# Patient Record
Sex: Male | Born: 1945 | Race: White | Hispanic: No | Marital: Married | Smoking: Current every day smoker
Health system: Southern US, Community
[De-identification: ages and names within clinical notes are randomized; demographics above are authoritative.]

## PROBLEM LIST (undated history)

## (undated) DIAGNOSIS — I10 Essential (primary) hypertension: Secondary | ICD-10-CM

## (undated) DIAGNOSIS — M1990 Unspecified osteoarthritis, unspecified site: Secondary | ICD-10-CM

## (undated) DIAGNOSIS — E7800 Pure hypercholesterolemia, unspecified: Secondary | ICD-10-CM

## (undated) DIAGNOSIS — N289 Disorder of kidney and ureter, unspecified: Secondary | ICD-10-CM

## (undated) DIAGNOSIS — Z720 Tobacco use: Secondary | ICD-10-CM

## (undated) DIAGNOSIS — I700 Atherosclerosis of aorta: Secondary | ICD-10-CM

## (undated) DIAGNOSIS — J309 Allergic rhinitis, unspecified: Secondary | ICD-10-CM

## (undated) DIAGNOSIS — I480 Paroxysmal atrial fibrillation: Secondary | ICD-10-CM

## (undated) DIAGNOSIS — I6529 Occlusion and stenosis of unspecified carotid artery: Secondary | ICD-10-CM

## (undated) DIAGNOSIS — I2510 Atherosclerotic heart disease of native coronary artery without angina pectoris: Secondary | ICD-10-CM

## (undated) DIAGNOSIS — I4510 Unspecified right bundle-branch block: Secondary | ICD-10-CM

## (undated) HISTORY — DX: Pure hypercholesterolemia, unspecified: E78.00

## (undated) HISTORY — DX: Tobacco use: Z72.0

## (undated) HISTORY — DX: Paroxysmal atrial fibrillation: I48.0

## (undated) HISTORY — DX: Unspecified right bundle-branch block: I45.10

## (undated) HISTORY — DX: Atherosclerotic heart disease of native coronary artery without angina pectoris: I25.10

## (undated) HISTORY — DX: Occlusion and stenosis of unspecified carotid artery: I65.29

## (undated) HISTORY — DX: Atherosclerosis of aorta: I70.0

## (undated) HISTORY — DX: Essential (primary) hypertension: I10

## (undated) HISTORY — DX: Unspecified osteoarthritis, unspecified site: M19.90

## (undated) HISTORY — DX: Allergic rhinitis, unspecified: J30.9

---

## 2000-01-12 ENCOUNTER — Emergency Department (HOSPITAL_COMMUNITY): Admission: EM | Admit: 2000-01-12 | Discharge: 2000-01-12 | Payer: Self-pay | Admitting: Emergency Medicine

## 2004-07-22 ENCOUNTER — Emergency Department (HOSPITAL_COMMUNITY): Admission: EM | Admit: 2004-07-22 | Discharge: 2004-07-22 | Payer: Self-pay | Admitting: Emergency Medicine

## 2008-03-14 ENCOUNTER — Ambulatory Visit (HOSPITAL_COMMUNITY): Admission: RE | Admit: 2008-03-14 | Discharge: 2008-03-14 | Payer: Self-pay | Admitting: Interventional Cardiology

## 2008-03-25 HISTORY — PX: SHOULDER ACROMIOPLASTY: SHX6093

## 2008-03-25 HISTORY — PX: CAROTID STENT: SHX1301

## 2008-03-31 ENCOUNTER — Encounter: Admission: RE | Admit: 2008-03-31 | Discharge: 2008-03-31 | Payer: Self-pay | Admitting: Cardiology

## 2008-04-11 ENCOUNTER — Inpatient Hospital Stay (HOSPITAL_BASED_OUTPATIENT_CLINIC_OR_DEPARTMENT_OTHER): Admission: RE | Admit: 2008-04-11 | Discharge: 2008-04-11 | Payer: Self-pay | Admitting: Cardiology

## 2008-04-13 ENCOUNTER — Ambulatory Visit (HOSPITAL_COMMUNITY): Admission: RE | Admit: 2008-04-13 | Discharge: 2008-04-14 | Payer: Self-pay | Admitting: Interventional Cardiology

## 2008-04-13 DIAGNOSIS — I2510 Atherosclerotic heart disease of native coronary artery without angina pectoris: Secondary | ICD-10-CM

## 2008-04-13 HISTORY — DX: Atherosclerotic heart disease of native coronary artery without angina pectoris: I25.10

## 2008-05-16 ENCOUNTER — Encounter: Admission: RE | Admit: 2008-05-16 | Discharge: 2008-05-16 | Payer: Self-pay | Admitting: Orthopaedic Surgery

## 2008-06-14 ENCOUNTER — Ambulatory Visit (HOSPITAL_BASED_OUTPATIENT_CLINIC_OR_DEPARTMENT_OTHER): Admission: RE | Admit: 2008-06-14 | Discharge: 2008-06-14 | Payer: Self-pay | Admitting: Orthopaedic Surgery

## 2008-06-27 ENCOUNTER — Encounter: Admission: RE | Admit: 2008-06-27 | Discharge: 2008-09-25 | Payer: Self-pay | Admitting: Orthopaedic Surgery

## 2008-08-11 ENCOUNTER — Ambulatory Visit (HOSPITAL_BASED_OUTPATIENT_CLINIC_OR_DEPARTMENT_OTHER): Admission: RE | Admit: 2008-08-11 | Discharge: 2008-08-11 | Payer: Self-pay | Admitting: Orthopaedic Surgery

## 2008-09-27 ENCOUNTER — Encounter: Admission: RE | Admit: 2008-09-27 | Discharge: 2008-11-16 | Payer: Self-pay | Admitting: Orthopaedic Surgery

## 2009-06-24 ENCOUNTER — Emergency Department (HOSPITAL_COMMUNITY): Admission: EM | Admit: 2009-06-24 | Discharge: 2009-06-25 | Payer: Self-pay | Admitting: Emergency Medicine

## 2010-07-03 LAB — POCT I-STAT, CHEM 8
HCT: 47 % (ref 39.0–52.0)
Hemoglobin: 16 g/dL (ref 13.0–17.0)

## 2010-07-05 LAB — POCT I-STAT, CHEM 8
BUN: 13 mg/dL (ref 6–23)
Chloride: 109 mEq/L (ref 96–112)
Creatinine, Ser: 1 mg/dL (ref 0.4–1.5)
Potassium: 4 mEq/L (ref 3.5–5.1)
TCO2: 18 mmol/L (ref 0–100)

## 2010-07-09 LAB — CBC: WBC: 8.6 10*3/uL (ref 4.0–10.5)

## 2010-07-09 LAB — POCT I-STAT, CHEM 8
Calcium, Ion: 1.17 mmol/L (ref 1.12–1.32)
Creatinine, Ser: 1.2 mg/dL (ref 0.4–1.5)
Glucose, Bld: 87 mg/dL (ref 70–99)
HCT: 45 % (ref 39.0–52.0)
Hemoglobin: 15.3 g/dL (ref 13.0–17.0)
Potassium: 4.6 mEq/L (ref 3.5–5.1)
TCO2: 26 mmol/L (ref 0–100)

## 2010-07-09 LAB — BASIC METABOLIC PANEL
Calcium: 8.9 mg/dL (ref 8.4–10.5)
Chloride: 108 mEq/L (ref 96–112)
Creatinine, Ser: 1.01 mg/dL (ref 0.4–1.5)
GFR calc Af Amer: >60 mL/min (ref >60)
Sodium: 140 mEq/L (ref 135–145)

## 2010-08-07 NOTE — Op Note (Signed)
NAME:  Julian Weaver, Julian Weaver NO.:  [id number]   MEDICAL RECORD NO.:  [id number]          PATIENT TYPE:  AMB   LOCATION:  DSC                          FACILITY:  [facility]   PHYSICIAN:  Claude Manges. Whitfield, M.D.DATE OF BIRTH:  07-02-1945   DATE OF PROCEDURE:  06/14/2008  DATE OF DISCHARGE:                               OPERATIVE REPORT   PREOPERATIVE DIAGNOSES:  1. Rotator cuff tear, right shoulder with retraction.  2. Impingement.  3. Degenerative joint disease of acromioclavicular joint.  4. Tear of ruptured biceps tendon.   POSTOPERATIVE DIAGNOSES:  1. Rotator cuff tear, right shoulder with retraction.  2. Impingement.  3. Degenerative joint disease of acromioclavicular joint.  4. Tear of ruptured biceps tendon.   PROCEDURES:  1. Diagnostic arthroscopy, right shoulder with debridement of      synovitis and labral tear.  2. Arthroscopic subacromial decompression.  3. Arthroscopic distal clavicle resection.  4. Mini-open rotator cuff tear repair with supplemental Restore SIS      patch.   SURGEON:  Claude Manges. Cleophas Dunker, MD   ASSISTANT:  Oris Drone. Petrarca, PA-C   ANESTHESIA:  General.   COMPLICATIONS:  None.   HISTORY:  A 65 year old gentleman sustained an injury to his right  shoulder in October when he was pulling on an object.  He is having  persistent pain since that time.  He has had a recent cardiac stent and  could not have an MRI scan, but we did perform a CT arthrogram with  evidence of rotator cuff tear.  He is not to have an arthroscopic  evaluation remaining on his Lovenox because of the stent per his  cardiologist.   DESCRIPTION OF PROCEDURE:  With the patient comfortable on operating  table, he was placed under general orotracheal anesthesia without  difficulty.  He was then placed in semi-sitting position with a shoulder  frame.  The right upper extremity was prepped with DuraPrep from the  base of the neck circumferentially below the elbow, a  sterile draping  was performed.   Marking pen was used to outline the [facility] joint, the coracoid, and the  acromion.  At a point, a fingerbreadth posterior and medial to the  posterior angle of acromion, a small stab wound was made.  The  arthroscope easily placed into the shoulder joint.   Diagnostic arthroscopy revealed diffuse synovitis.  There was an obvious  tear of the rotator cuff with retraction.  There was obvious tearing of  the biceps tendon and a tear of the anterior labrum at about 3 o'clock  position.  A second portal was established anteriorly, shaving of the  joint was performed including a synovectomy and debridement of the  labrum.   The arthroscope was then placed at subacromial space posteriorly, the  cannula at subacromial space anteriorly, and a third portal was  established on the lateral subacromial space.  An arthroscopic  subacromial decompression was performed, there was considerable beefy  red bursal material, this was debrided with the ArthroCare wand.  There  was obvious overhang of the anterior and lateral acromion.  An  anteroinferior acromioplasty was  performed with a 6-mm bur with a very  nice decompression.  I encountered very little bleeding, which I did, I  was able to control with the ArthroCare wand.  Distal clavicle was  obviously degenerative with synovitis and the distal clavicle resection  was performed with 6-mm bur.  There was obvious complex tearing of the  cuff, as I could see it through the bursal surface with exposed humeral  head.   A mini-open rotator cuff tear repair was then performed, about an inch  and a half incision was made along the anterior aspect of the shoulder  while sharp dissection carried down to the subcutaneous tissue.  Small  bleeders were Bovie coagulated.  The deltoid fascia was incised along  one of its raphes.  The subacromial space was entered.  Self-retaining  retractors were inserted.  I thought he had a very  nice subacromial  decompression.  The cuff tear was identified.  It was a sort of V shaped  with very ragged edges.  I debrided the edges, I was able to loosen up  the edges by finger manipulation and then I sutured the edges with 0  Ethibond suture from superiorly to anteriorly inferiorly.  I  supplemented the repair with the Restore SIS patch after soaking up to  10 minutes in saline solution using 2-0 Ethibond.  I had a very nice  coverage, nice repair without evidence of impingement.  Wound was  irrigated with saline solution throughout the operative procedure.  There was some difficulty because of some bleeding, based on his Plavix  and aspirin.  The deltoid fascia was closed anatomically with a running  0 Vicryl, subcu with 2-0 Vicryl.  Skin closed with Steri-Strips and with  benzoin.  A sterile bulky dressing was applied followed by a sling.  We  did inject Marcaine with epinephrine.   The patient tolerated the procedure without complications.   PLAN:  Dilaudid for pain.  Phenergan for nausea.  Office, end of week.      Claude Manges. Cleophas Dunker, M.D.  Electronically Signed     PWW/MEDQ  D:  06/14/2008  T:  06/15/2008  Job:  [id number]

## 2010-08-07 NOTE — Op Note (Signed)
NAME:  Julian Weaver, Julian Weaver NO.:  [id number]   MEDICAL RECORD NO.:  [id number]          PATIENT TYPE:  AMB   LOCATION:  DSC                          FACILITY:  [facility]   PHYSICIAN:  Claude Manges. Whitfield, M.D.DATE OF BIRTH:  01-14-1946   DATE OF PROCEDURE:  08/11/2008  DATE OF DISCHARGE:                               OPERATIVE REPORT   PREOPERATIVE DIAGNOSIS:  Adhesive capsulitis, right shoulder.   POSTOPERATIVE DIAGNOSIS:  Adhesive capsulitis, right shoulder.   PROCEDURE:  Manipulation of right shoulder.   SURGEON:  Claude Manges. Cleophas Dunker, MD   ANESTHESIA:  General mask.   COMPLICATIONS:  None.   HISTORY:  A 65 year old gentleman underwent a right shoulder rotator  cuff tear repair 2 months ago.  He required a SIS patch given the  extensive nature of the tear with poor tissue.  He has progressed in  physical therapy to the point where he has having minimal discomfort  until he reaches a point where he is about 130 degrees of flexion.  At  that point, he is uncomfortably as a same pain with abduction that has  been limited as a result of adhesive capsulitis.  He is now to have  manipulation.   PROCEDURE:  With the patient comfortable on the operating stretcher, he  was placed under general mask anesthesia.  Gentle range of motion of the  shoulder, released adhesions such that the patient could have raise his  arm over his head, such that I could raise his arm over his head.  With  further lysis, I implied full abduction and external rotation.  The  shoulder was then prepped with the Betadine.  I injected 5 mL of 1%  Xylocaine with epinephrine and 80 mg Depo-Medrol on the shoulder joint.  Band-Aid was applied.  The patient was awoken and returned to the post  anesthesia recovery room.   PLAN:  Followup physical therapy.  Ultram for pain as he has difficulty  with codeine and oxycodone of his 10-14 days.      Claude Manges. Cleophas Dunker, M.D.  Electronically Signed     PWW/MEDQ  D:  08/11/2008  T:  08/11/2008  Job:  [id number]

## 2010-08-07 NOTE — Cardiovascular Report (Signed)
NAME:  Julian Weaver, Julian Weaver NO.:  [id number]   MEDICAL RECORD NO.:  [id number]          PATIENT TYPE:  INP   LOCATION:  2501                         FACILITY:  [facility]   PHYSICIAN:  Lyn Records, M.D.   DATE OF BIRTH:  01-Apr-1945   DATE OF PROCEDURE:  04/13/2008  DATE OF DISCHARGE:                            CARDIAC CATHETERIZATION   INDICATIONS:  Atypical chest pain and abnormal stress Cardiolite with  inferior ischemia.   PROCEDURE PERFORMED:  Drug-eluting stent RCA.   DESCRIPTION:  After informed consent, a 6-French sheath was placed in  the right femoral artery using a modified Seldinger technique.  A 6-  French side-hole Judkins right catheter was used for angiographic  imaging and the interventional procedure.  The patient received 600 mg  of Plavix prior to the procedure.  A BMW wire was advanced into the  vessel, it was used to get some general idea of the length of the  vessel.  It appeared to be about one-half the length of the radiopaque  portion of the wire.  We chose to direct stenting with a 3.0 x 18-mm  long Zion's V drug-eluting stent.  The stent was deployed to 13  atmospheres.  Two balloon inflations were performed.  We then  postdilated with a 12-mm long x 3.5-mm diameter Voyager [place] balloon.  Three balloon inflations were performed with the peak pressure of 16  atmospheres.  The final angiographic result despite high-pressure  balloon inflations demonstrated up to 10% narrowing in the proximal  portion of the stented region.  TIMI grade 3 flow was noted.  No  contrast staining or evidence of dissection was noted.  We did not  upgrade the size of our balloon because of the precarious location of  the proximal stent margin adjacent to normal tissue and the risk of  dissection.  If the patient does develop restenosis, we would consider  an additional stent implantation in this region with higher balloon  pressures and/or larger balloon sizes.  We did  manage a nice lumen with  brisk distal flow.   Angio-Seal was performed with good hemostasis.   CONCLUSION:  1. Successful drug-eluting stent implantation in the mid right      coronary with reduction in stenosis from 70% to less than 10% with      TIMI 3 flow.  2. Successful Angio-Seal.   PLAN:  Aspirin and Plavix for year.   DATE OF DISCHARGE:  April 14, 2008.      Lyn Records, M.D.  Electronically Signed    HWS/MEDQ  D:  04/13/2008  T:  04/14/2008  Job:  [id number]   cc:   Armanda Magic, M.D.  Duncan Dull, M.D.

## 2010-08-07 NOTE — Cardiovascular Report (Signed)
NAME:  Julian Weaver, Julian Weaver NO.:  [id number]   MEDICAL RECORD NO.:  [id number]          PATIENT TYPE:  OIB   LOCATION:  1965                         FACILITY:  [facility]   PHYSICIAN:  Armanda Magic, M.D.     DATE OF BIRTH:  1945-11-20   DATE OF PROCEDURE:  DATE OF DISCHARGE:  04/11/2008                            CARDIAC CATHETERIZATION   PROCEDURE:  Left heart catheterization, coronary angiography, and left  ventriculography.   OPERATOR:  Armanda Magic, MD   INDICATIONS:  Chest pain and abnormal coronary CTA with evidence of  obstructive disease in the right coronary artery.   COMPLICATIONS:  None.   IV ACCESS:  Via right femoral artery 4-French sheath.   MEDICATIONS USED:  Versed 1 mg IV and fentanyl 25 mcg IV.   This is a 65 year old male who presents with episodic chest pain and  underwent nuclear stress test which showed a mild perfusion defect in  the inferior wall.  He did not want to undergo cardiac catheterization  and underwent coronary CT angiogram which revealed obstructive disease  in the right coronary artery.  He now presents for cardiac  catheterization.   The patient was brought to the cardiac catheterization laboratory in a  fasting, nonsedated state.  Informed consent was obtained.  The patient  was connected to continuous heart rate and pulse oximetry monitoring and  intermittent blood pressure monitoring.  The right groin was prepped and  draped in a sterile fashion.  Xylocaine 1% was used for local  anesthesia.  Using a modified Seldinger technique, a 4-French sheath was  placed in the right femoral artery.  Under fluoroscopic guidance, a 4-  [place], JL-4 catheter was placed in the left coronary artery.  It could  not adequately engage the left coronary ostium and therefore was  exchanged out over a guidewire for a 4-French, JL-5 catheter.  The  catheter successfully engaged the coronary ostium.  Multiple cine films  were taken at 30-degree RAO  and 40-degree LAO views.  This catheter was  then exchanged out over a guidewire for a 4-French 3D RCA catheter which  successfully engaged the coronary ostium.  Multiple cine films taken at  30-degree RAO and 40-degree LAO views.  This catheter was then exchanged  out over a guidewire for a 4-French angled pigtail catheter which was  placed under fluoroscopic guidance in the left ventricular cavity.  Left  ventriculography was performed in a 30-degree RAO view using a total of  30 mL of contrast at 15 mL per second.  The catheter was then pulled  back across the aortic valve with no significant gradient noted.  At the  end of the procedure, all catheters and sheaths were removed.  Manual  compression was performed so adequate hemostasis was obtained.  The  patient transferred back to room in stable condition.   RESULTS:  The left main coronary artery is widely patent and bifurcates  into left anterior descending artery and left circumflex artery.  In the  LAO views, it looks like there is some minimal narrowing of the left  main distally up to 20-30% but  in other views, there does not appear to  be any significant atherosclerosis of the left main.  The left main then  bifurcates into left anterior descending artery and left circumflex  artery.  The left anterior descending artery has luminal irregularities  of 20-30% and traverses the apex.  It gives rise to 2 diagonal branches,  both of which are widely patent.   The left circumflex is widely patent throughout its course in the AV  groove with luminal irregularities throughout.  It gives rise to a small  first obtuse marginal branch and then a large second obtuse marginal  branch which bifurcates into 2 daughter branches, both of which are  widely patent.   The right coronary artery is widely patent in its proximal portion and  then there is a 60-70% tubular stenosis of the mid RCA.  The distal RCA  bifurcates into posterior  descending artery and posterolateral artery,  both of which are widely patent.   ASSESSMENT:  1. 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  2. Normal left ventricular function.  3. No evidence of gradient across the aortic valve and normal left      ventricular end-diastolic pressure.  4. Chest pain secondary to one-vessel obstructive disease in the right      coronary artery.   PLAN:  1. Add Imdur 30 mg a day.  2. Continue aspirin.  3. We will discharge home after IV fluid and bedrest.  4. We will set up for outpatient angioplasty and stent by Dr. Verdis Prime.      Armanda Magic, M.D.  Electronically Signed     TT/MEDQ  D:  04/12/2008  T:  04/13/2008  Job:  [id number]

## 2012-12-22 DIAGNOSIS — Z23 Encounter for immunization: Secondary | ICD-10-CM | POA: Diagnosis not present

## 2013-01-24 ENCOUNTER — Other Ambulatory Visit: Payer: Self-pay | Admitting: *Deleted

## 2013-01-24 DIAGNOSIS — E7800 Pure hypercholesterolemia, unspecified: Secondary | ICD-10-CM

## 2013-01-24 DIAGNOSIS — Z79899 Other long term (current) drug therapy: Secondary | ICD-10-CM

## 2013-02-25 ENCOUNTER — Ambulatory Visit: Payer: Self-pay | Admitting: Cardiology

## 2013-02-26 ENCOUNTER — Other Ambulatory Visit: Payer: Self-pay

## 2013-03-11 ENCOUNTER — Encounter: Payer: Self-pay | Admitting: General Surgery

## 2013-03-11 DIAGNOSIS — E7800 Pure hypercholesterolemia, unspecified: Secondary | ICD-10-CM

## 2013-03-11 DIAGNOSIS — R0989 Other specified symptoms and signs involving the circulatory and respiratory systems: Secondary | ICD-10-CM

## 2013-03-11 DIAGNOSIS — I10 Essential (primary) hypertension: Secondary | ICD-10-CM | POA: Insufficient documentation

## 2013-03-11 DIAGNOSIS — F17200 Nicotine dependence, unspecified, uncomplicated: Secondary | ICD-10-CM

## 2013-03-11 DIAGNOSIS — I2510 Atherosclerotic heart disease of native coronary artery without angina pectoris: Secondary | ICD-10-CM | POA: Insufficient documentation

## 2013-03-11 DIAGNOSIS — Z79899 Other long term (current) drug therapy: Secondary | ICD-10-CM

## 2013-03-12 ENCOUNTER — Ambulatory Visit (INDEPENDENT_AMBULATORY_CARE_PROVIDER_SITE_OTHER): Payer: Medicare Other | Admitting: Cardiology

## 2013-03-12 ENCOUNTER — Encounter: Payer: Self-pay | Admitting: Cardiology

## 2013-03-12 VITALS — BP 134/78 | HR 61 | Ht 69.0 in | Wt 176.0 lb

## 2013-03-12 DIAGNOSIS — I10 Essential (primary) hypertension: Secondary | ICD-10-CM | POA: Diagnosis not present

## 2013-03-12 DIAGNOSIS — F17200 Nicotine dependence, unspecified, uncomplicated: Secondary | ICD-10-CM

## 2013-03-12 DIAGNOSIS — E7800 Pure hypercholesterolemia, unspecified: Secondary | ICD-10-CM | POA: Diagnosis not present

## 2013-03-12 DIAGNOSIS — I2510 Atherosclerotic heart disease of native coronary artery without angina pectoris: Secondary | ICD-10-CM | POA: Diagnosis not present

## 2013-03-12 DIAGNOSIS — Z720 Tobacco use: Secondary | ICD-10-CM | POA: Insufficient documentation

## 2013-03-12 LAB — LIPID PANEL
HDL: 40.6 mg/dL (ref >39.00)
LDL Cholesterol: 57 mg/dL (ref 0–99)
Total CHOL/HDL Ratio: 3
VLDL: 14.4 mg/dL (ref 0.0–40.0)

## 2013-03-12 LAB — ALT: ALT: 21 U/L (ref 0–53)

## 2013-03-12 MED ORDER — SIMVASTATIN 20 MG PO TABS: 20.0000 mg | ORAL_TABLET | Freq: Every day | ORAL | Status: DC

## 2013-03-12 MED ORDER — AMLODIPINE BESYLATE 5 MG PO TABS: 5.0000 mg | ORAL_TABLET | Freq: Every day | ORAL | Status: DC

## 2013-03-12 NOTE — Progress Notes (Signed)
[address] [place], [place]  [address] Phone: [phone] Fax:  [phone]  Date:  03/12/2013   ID:  Julian Weaver, DOB 08-15-1945, MRN [id number]  PCP:  Hollice Espy, MD  Cardiologist:  Armanda Magic, MD     History of Present Illness: Julian Weaver is a 67 y.o. 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  He is doing well.  He denies any chest pain, SOB, DOE, LE edema, palpitations or syncope.  He occasionally will have a dizzy spell.  He has had a chronic nonproductive cough   Wt Readings from Last 3 Encounters:  03/12/13 176 lb (79.833 kg)  03/11/13 172 lb 6.4 oz (78.2 kg)     Past Medical History  Diagnosis Date  . Hypertension   . Hypercholesteremia   . Tobacco abuse   . RBBB   . Coronary artery disease 04/13/2008    Drug eluting stent in RCA    Current Outpatient Prescriptions  Medication Sig Dispense Refill  . amLODipine (NORVASC) 5 MG tablet Take 5 mg by mouth daily.      [name] aspirin 325 MG EC tablet Take 325 mg by mouth daily.      [name] buPROPion (WELLBUTRIN XL) 150 MG 24 hr tablet Take 150 mg by mouth daily.      . clopidogrel (PLAVIX) 75 MG tablet Take 75 mg by mouth daily with breakfast.      . co-enzyme Q-10 30 MG capsule Take 100 mg by mouth daily.      . diphenhydrAMINE (BENADRYL) 25 MG tablet Take 25 mg by mouth every 6 (six) hours as needed (for allergic reaction).      [name] EPINEPHrine (EPIPEN IJ) Inject as directed as needed.      . promethazine-codeine (PHENERGAN WITH CODEINE) 6.25-10 MG/5ML syrup Take 5 mLs by mouth every 6 (six) hours as needed for cough.      . simvastatin (ZOCOR) 20 MG tablet Take 20 mg by mouth daily.       No current facility-administered medications for this visit.    Allergies:    Allergies  Allergen Reactions  . Bee Venom Swelling    Social History:  The patient  reports that he has been smoking.  He does not have any smokeless tobacco history on file. He  reports that he does not drink alcohol or use illicit drugs.   Family History:  The patient's family history is not on file.   ROS:  Please see the history of present illness.      All other systems reviewed and negative.   PHYSICAL EXAM: VS:  BP 134/78  Pulse 61  Ht 5\' 9"  (1.753 m)  Wt 176 lb (79.833 kg)  BMI 25.98 kg/m2 Well nourished, well developed, in no acute distress HEENT: normal Neck: no JVD Cardiac:  normal S1, S2; RRR; no murmur Lungs:  clear to auscultation bilaterally, no wheezing, rhonchi or rales Abd: soft, nontender, no hepatomegaly Ext: no edema Skin: warm and dry Neuro:  CNs 2-12 intact, no focal abnormalities noted  EKG:  NSR with no ST changes     ASSESSMENT AND PLAN:  1. HTN - well controlled  - continue amlodipine 2.   Dyslipidemia -recheck fasting lipids and ALT   - continue simvastatin 3.   ASCAD   - continue ASA  - he is 4 years out from PCI so I have told him to stop Plavix  4.  Cough - I have asked him to make an appt to see his PCP in regards to his cough - he is not on  any meds that should cause cough but is a smoker 5.  Tobacco use - he is not ready to quit  Followup with me in 1 year   Signed, Armanda Magic, MD 03/12/2013 9:41 AM

## 2013-03-12 NOTE — Patient Instructions (Signed)
You will need lab work today:  Lipids & ALT We will call you with your results  Follow-up with your primary care doctor about your cough  Medication changes:   STOP  Plavix  Your physician wants you to follow-up in: 1 year with Dr. Mayford Knife.  You will receive a reminder letter in the mail two months in advance. If you don't receive a letter, please call our office to schedule the follow-up appointment.

## 2013-03-16 ENCOUNTER — Other Ambulatory Visit: Payer: Self-pay | Admitting: General Surgery

## 2013-03-16 ENCOUNTER — Encounter: Payer: Self-pay | Admitting: General Surgery

## 2013-03-16 DIAGNOSIS — E7800 Pure hypercholesterolemia, unspecified: Secondary | ICD-10-CM

## 2013-03-21 ENCOUNTER — Other Ambulatory Visit: Payer: Self-pay | Admitting: Cardiology

## 2013-05-05 DIAGNOSIS — J40 Bronchitis, not specified as acute or chronic: Secondary | ICD-10-CM | POA: Diagnosis not present

## 2013-06-30 ENCOUNTER — Telehealth: Payer: Self-pay

## 2013-06-30 NOTE — Telephone Encounter (Signed)
Made Mary aware.

## 2013-06-30 NOTE — Telephone Encounter (Signed)
I tried calling back and they were closed for lunch. Will call back after two.

## 2013-06-30 NOTE — Telephone Encounter (Signed)
Received a call from Dr.Cate's office.She stated she needed to know if patient requires antibiotic before dental work.Call back PoydrasMary or Morrie Sheldonshley at # [phone].Message sent to Dr.Turner for advice.

## 2013-06-30 NOTE — Telephone Encounter (Signed)
No SBE prophylaxis needed. 

## 2013-06-30 NOTE — Telephone Encounter (Signed)
To Dr Turner to advise 

## 2013-07-21 DIAGNOSIS — T148XXA Other injury of unspecified body region, initial encounter: Secondary | ICD-10-CM | POA: Diagnosis not present

## 2013-09-14 ENCOUNTER — Other Ambulatory Visit: Payer: Medicare Other

## 2013-09-15 ENCOUNTER — Other Ambulatory Visit: Payer: Self-pay | Admitting: Family Medicine

## 2013-09-15 ENCOUNTER — Encounter: Payer: Self-pay | Admitting: Cardiology

## 2013-09-15 DIAGNOSIS — I2510 Atherosclerotic heart disease of native coronary artery without angina pectoris: Secondary | ICD-10-CM | POA: Diagnosis not present

## 2013-09-15 DIAGNOSIS — E7800 Pure hypercholesterolemia, unspecified: Secondary | ICD-10-CM | POA: Diagnosis not present

## 2013-09-15 DIAGNOSIS — F17200 Nicotine dependence, unspecified, uncomplicated: Secondary | ICD-10-CM | POA: Diagnosis not present

## 2013-09-15 DIAGNOSIS — R059 Cough, unspecified: Secondary | ICD-10-CM | POA: Diagnosis not present

## 2013-09-15 DIAGNOSIS — Z125 Encounter for screening for malignant neoplasm of prostate: Secondary | ICD-10-CM | POA: Diagnosis not present

## 2013-09-15 DIAGNOSIS — Z139 Encounter for screening, unspecified: Secondary | ICD-10-CM

## 2013-09-15 DIAGNOSIS — Z0000 Encounter for general adult medical examination without abnormal findings: Secondary | ICD-10-CM | POA: Diagnosis not present

## 2013-09-15 DIAGNOSIS — I10 Essential (primary) hypertension: Secondary | ICD-10-CM | POA: Diagnosis not present

## 2013-09-15 DIAGNOSIS — R05 Cough: Secondary | ICD-10-CM | POA: Diagnosis not present

## 2013-10-04 ENCOUNTER — Ambulatory Visit
Admission: RE | Admit: 2013-10-04 | Discharge: 2013-10-04 | Disposition: A | Discharge status: Home / Self Care | Payer: Medicare Other | Source: Ambulatory Visit | Attending: Family Medicine | Admitting: Family Medicine

## 2013-10-04 DIAGNOSIS — Z139 Encounter for screening, unspecified: Secondary | ICD-10-CM

## 2013-10-04 DIAGNOSIS — Z136 Encounter for screening for cardiovascular disorders: Secondary | ICD-10-CM | POA: Diagnosis not present

## 2013-10-29 ENCOUNTER — Ambulatory Visit
Admission: RE | Admit: 2013-10-29 | Discharge: 2013-10-29 | Disposition: A | Discharge status: Home / Self Care | Payer: Medicare Other | Source: Ambulatory Visit | Attending: Family Medicine | Admitting: Family Medicine

## 2013-10-29 ENCOUNTER — Other Ambulatory Visit: Payer: Self-pay | Admitting: Family Medicine

## 2013-10-29 DIAGNOSIS — R1030 Lower abdominal pain, unspecified: Secondary | ICD-10-CM

## 2013-10-29 DIAGNOSIS — R05 Cough: Secondary | ICD-10-CM

## 2013-10-29 DIAGNOSIS — Z91038 Other insect allergy status: Secondary | ICD-10-CM | POA: Diagnosis not present

## 2013-10-29 DIAGNOSIS — R109 Unspecified abdominal pain: Secondary | ICD-10-CM | POA: Diagnosis not present

## 2013-10-29 DIAGNOSIS — R053 Chronic cough: Secondary | ICD-10-CM

## 2013-10-29 DIAGNOSIS — F17200 Nicotine dependence, unspecified, uncomplicated: Secondary | ICD-10-CM

## 2013-10-29 DIAGNOSIS — N200 Calculus of kidney: Secondary | ICD-10-CM | POA: Diagnosis not present

## 2013-10-29 DIAGNOSIS — R059 Cough, unspecified: Secondary | ICD-10-CM | POA: Diagnosis not present

## 2013-10-29 MED ORDER — IOHEXOL 300 MG/ML  SOLN
100.0000 mL | Freq: Once | INTRAMUSCULAR | Status: AC | PRN
  Administered 2013-10-29: 100 mL via INTRAVENOUS

## 2013-11-04 DIAGNOSIS — R109 Unspecified abdominal pain: Secondary | ICD-10-CM | POA: Diagnosis not present

## 2013-11-04 DIAGNOSIS — J329 Chronic sinusitis, unspecified: Secondary | ICD-10-CM | POA: Diagnosis not present

## 2013-11-12 ENCOUNTER — Ambulatory Visit (INDEPENDENT_AMBULATORY_CARE_PROVIDER_SITE_OTHER): Payer: Medicare Other | Admitting: Surgery

## 2013-11-12 ENCOUNTER — Encounter (INDEPENDENT_AMBULATORY_CARE_PROVIDER_SITE_OTHER): Payer: Self-pay | Admitting: Surgery

## 2013-11-12 VITALS — BP 122/70 | HR 75 | Temp 97.4°F | Ht 69.0 in | Wt 166.0 lb

## 2013-11-12 DIAGNOSIS — K4090 Unilateral inguinal hernia, without obstruction or gangrene, not specified as recurrent: Secondary | ICD-10-CM

## 2013-11-12 NOTE — Patient Instructions (Signed)

## 2013-11-12 NOTE — Progress Notes (Signed)
Patient ID: Julian Weaver, male   DOB: May 19, 1945, 68 y.o.   MRN: [id number]  Chief Complaint  Patient presents with  . eval hernia    HPI Julian Weaver is a 68 y.o. male.  Patient sent at the request of Dr. Kevan NyGates for left inguinal hernia. Patient has had pain and swelling of left groin for a number of months. The pain is mild to moderate density location left groin made worse with exertion. There is mild swelling as well left groin. Patient had CT scan done to evaluate abdominal pain which showed concern for possible left inguinal hernia. No change in bowel or bladder function. HPI  Past Medical History  Diagnosis Date  . Hypertension   . Hypercholesteremia   . Tobacco abuse   . RBBB   . Coronary artery disease 04/13/2008    Drug eluting stent in RCA  . Arthritis     Past Surgical History  Procedure Laterality Date  . Carotid stent  2010  . Shoulder acromioplasty  2010    Family History  Problem Relation Age of Onset  . Stroke Mother   . Stroke Father     Social History History  Substance Use Topics  . Smoking status: Current Every Day Smoker -- 0.50 packs/day  . Smokeless tobacco: Not on file  . Alcohol Use: No    Allergies  Allergen Reactions  . Bee Venom Swelling    Current Outpatient Prescriptions  Medication Sig Dispense Refill  . amLODipine (NORVASC) 5 MG tablet Take 1 tablet (5 mg total) by mouth daily.  30 tablet  11  . aspirin 325 MG EC tablet Take 325 mg by mouth daily.      [name]. buPROPion (WELLBUTRIN XL) 150 MG 24 hr tablet Take 150 mg by mouth daily.      [name]. co-enzyme Q-10 30 MG capsule Take 100 mg by mouth daily.      . diphenhydrAMINE (BENADRYL) 25 MG tablet Take 25 mg by mouth every 6 (six) hours as needed (for allergic reaction).      [name]. EPINEPHrine (EPIPEN IJ) Inject as directed as needed.      . promethazine-codeine (PHENERGAN WITH CODEINE) 6.25-10 MG/5ML syrup Take 5 mLs by mouth every 6 (six) hours as needed for cough.      . simvastatin  (ZOCOR) 20 MG tablet Take 1 tablet (20 mg total) by mouth daily.  30 tablet  11   No current facility-administered medications for this visit.    Review of Systems Review of Systems  Constitutional: Negative for fever, chills and unexpected weight change.  HENT: Negative for congestion, hearing loss, sore throat, trouble swallowing and voice change.   Eyes: Negative for visual disturbance.  Respiratory: Positive for cough. Negative for wheezing.   Cardiovascular: Negative for chest pain, palpitations and leg swelling.  Gastrointestinal: Positive for abdominal pain. Negative for nausea, vomiting, diarrhea, constipation, blood in stool, abdominal distention, anal bleeding and rectal pain.  Genitourinary: Negative for hematuria and difficulty urinating.  Musculoskeletal: Negative for arthralgias.  Skin: Negative for rash and wound.  Neurological: Negative for seizures, syncope, weakness and headaches.  Hematological: Negative for adenopathy. Does not bruise/bleed easily.  Psychiatric/Behavioral: Negative for confusion.    Blood pressure 122/70, pulse 75, temperature 97.4 F (36.3 C), height 5\' 9"  (1.753 m), weight 166 lb (75.297 kg).  Physical Exam Physical Exam  Constitutional: He is oriented to person, place, and time. He appears well-developed and well-nourished.  HENT:  Head: Normocephalic and atraumatic.  Eyes: Pupils are equal, round, and reactive to light. No scleral icterus.  Neck: Normal range of motion. Neck supple.  Cardiovascular: Normal rate and regular rhythm.   Pulmonary/Chest: Effort normal and breath sounds normal.  Abdominal: Soft. There is no tenderness. A hernia is present. Hernia confirmed positive in the left inguinal area. Hernia confirmed negative in the right inguinal area.  Musculoskeletal: Normal range of motion.  Neurological: He is alert and oriented to person, place, and time.  Skin: Skin is warm and dry.  Psychiatric: He has a normal mood and affect.  His behavior is normal. Judgment and thought content normal.    Data Reviewed Office notes Dr Kevan Ny  Assessment    Reducible left inguinal hernia    Plan    Recommend repair of left INGUINAL hernia with mesh. The risk of hernia repair include bleeding,  Infection,   Recurrence of the hernia,  Mesh use, chronic pain,  Organ injury,  Bowel injury,  Bladder injury,   nerve injury with numbness around the incision,  Death,  and worsening of preexisting  medical problems.  The alternatives to surgery have been discussed as well..  Long term expectations of both operative and non operative treatments have been discussed.   The patient agrees to proceed.       Dorette Hartel A. 11/12/2013, 12:38 PM

## 2013-11-30 ENCOUNTER — Emergency Department (HOSPITAL_COMMUNITY): Payer: Medicare Other

## 2013-11-30 ENCOUNTER — Encounter (HOSPITAL_COMMUNITY): Payer: Self-pay | Admitting: Emergency Medicine

## 2013-11-30 ENCOUNTER — Emergency Department (HOSPITAL_COMMUNITY)
Admission: EM | Admit: 2013-11-30 | Discharge: 2013-12-01 | Disposition: A | Discharge status: Home / Self Care | Payer: Medicare Other | Attending: Emergency Medicine | Admitting: Emergency Medicine

## 2013-11-30 DIAGNOSIS — Z7982 Long term (current) use of aspirin: Secondary | ICD-10-CM | POA: Insufficient documentation

## 2013-11-30 DIAGNOSIS — R42 Dizziness and giddiness: Secondary | ICD-10-CM | POA: Diagnosis not present

## 2013-11-30 DIAGNOSIS — R112 Nausea with vomiting, unspecified: Secondary | ICD-10-CM | POA: Insufficient documentation

## 2013-11-30 DIAGNOSIS — I10 Essential (primary) hypertension: Secondary | ICD-10-CM | POA: Diagnosis not present

## 2013-11-30 DIAGNOSIS — Z79899 Other long term (current) drug therapy: Secondary | ICD-10-CM | POA: Diagnosis not present

## 2013-11-30 DIAGNOSIS — I2510 Atherosclerotic heart disease of native coronary artery without angina pectoris: Secondary | ICD-10-CM | POA: Diagnosis not present

## 2013-11-30 DIAGNOSIS — E7800 Pure hypercholesterolemia, unspecified: Secondary | ICD-10-CM | POA: Diagnosis not present

## 2013-11-30 DIAGNOSIS — N201 Calculus of ureter: Secondary | ICD-10-CM | POA: Diagnosis not present

## 2013-11-30 DIAGNOSIS — F17200 Nicotine dependence, unspecified, uncomplicated: Secondary | ICD-10-CM | POA: Diagnosis not present

## 2013-11-30 DIAGNOSIS — Z9861 Coronary angioplasty status: Secondary | ICD-10-CM | POA: Insufficient documentation

## 2013-11-30 DIAGNOSIS — R1110 Vomiting, unspecified: Secondary | ICD-10-CM | POA: Diagnosis not present

## 2013-11-30 DIAGNOSIS — M129 Arthropathy, unspecified: Secondary | ICD-10-CM | POA: Insufficient documentation

## 2013-11-30 DIAGNOSIS — R109 Unspecified abdominal pain: Secondary | ICD-10-CM | POA: Diagnosis not present

## 2013-11-30 DIAGNOSIS — N211 Calculus in urethra: Secondary | ICD-10-CM | POA: Diagnosis not present

## 2013-11-30 DIAGNOSIS — N200 Calculus of kidney: Secondary | ICD-10-CM | POA: Diagnosis not present

## 2013-11-30 LAB — CBC WITH DIFFERENTIAL/PLATELET
Basophils Absolute: 0 10*3/uL (ref 0.0–0.1)
Basophils Relative: 0 % (ref 0–1)
Eosinophils Absolute: 0 10*3/uL (ref 0.0–0.7)
Eosinophils Relative: 0 % (ref 0–5)
HCT: 43.4 % (ref 39.0–52.0)
HEMOGLOBIN: 15.4 g/dL (ref 13.0–17.0)
LYMPHS ABS: 1.1 10*3/uL (ref 0.7–4.0)
LYMPHS PCT: 8 % — AB (ref 12–46)
MCH: 32.3 pg (ref 26.0–34.0)
MCHC: 35.5 g/dL (ref 30.0–36.0)
MCV: 91 fL (ref 78.0–100.0)
Monocytes Absolute: 0.6 10*3/uL (ref 0.1–1.0)
Monocytes Relative: 4 % (ref 3–12)
NEUTROS PCT: 88 % — AB (ref 43–77)
Neutro Abs: 12.4 10*3/uL — ABNORMAL HIGH (ref 1.7–7.7)
Platelets: 176 10*3/uL (ref 150–400)
RBC: 4.77 MIL/uL (ref 4.22–5.81)
RDW: 13.4 % (ref 11.5–15.5)
WBC: 14.1 10*3/uL — AB (ref 4.0–10.5)

## 2013-11-30 LAB — URINALYSIS, ROUTINE W REFLEX MICROSCOPIC
Bilirubin Urine: NEGATIVE
Glucose, UA: NEGATIVE mg/dL
Ketones, ur: NEGATIVE mg/dL
Leukocytes, UA: NEGATIVE
Nitrite: NEGATIVE
PROTEIN: 30 mg/dL — AB
Specific Gravity, Urine: 1.023 (ref 1.005–1.030)
Urobilinogen, UA: 0.2 mg/dL (ref 0.0–1.0)
pH: 6 (ref 5.0–8.0)

## 2013-11-30 LAB — BASIC METABOLIC PANEL
Anion gap: 13 (ref 5–15)
BUN: 17 mg/dL (ref 6–23)
CHLORIDE: 103 meq/L (ref 96–112)
CO2: 24 meq/L (ref 19–32)
Calcium: 9.2 mg/dL (ref 8.4–10.5)
Creatinine, Ser: 0.9 mg/dL (ref 0.50–1.35)
GFR calc Af Amer: >90 mL/min (ref >90)
GFR, EST NON AFRICAN AMERICAN: 87 mL/min — AB (ref >90)
Glucose, Bld: 118 mg/dL — ABNORMAL HIGH (ref 70–99)
Potassium: 4.2 mEq/L (ref 3.7–5.3)
SODIUM: 140 meq/L (ref 137–147)

## 2013-11-30 LAB — URINE MICROSCOPIC-ADD ON

## 2013-11-30 MED ORDER — KETOROLAC TROMETHAMINE 30 MG/ML IJ SOLN
30.0000 mg | Freq: Once | INTRAMUSCULAR | Status: AC
  Administered 2013-11-30: 30 mg via INTRAVENOUS
  Filled 2013-11-30: qty 1

## 2013-11-30 MED ORDER — ONDANSETRON HCL 4 MG/2ML IJ SOLN
4.0000 mg | Freq: Once | INTRAMUSCULAR | Status: AC
  Administered 2013-11-30: 4 mg via INTRAVENOUS
  Filled 2013-11-30: qty 2

## 2013-11-30 NOTE — ED Notes (Signed)
Pt reports left sided flank pain since earlier this afternoon, states he has not voided since 1300. Pt repots hx of kidney stones. Also reports vomiting X 6 but states he is not nauseas at current. Pain 9/10 L flank.

## 2013-11-30 NOTE — ED Notes (Signed)
Pt reports left side flank pain that radiates to groin x 3 hours. Hx of kidney stones and states this feels similar. Pt reports 1 episode of emesis, but denies N/D at this time. Denies hematuria. Pt in NAD. AO x4.

## 2013-11-30 NOTE — ED Provider Notes (Signed)
CSN: [id number]     Arrival date & time 11/30/13  1837 History   First MD Initiated Contact with Patient 11/30/13 2022     Chief Complaint  Patient presents with  . Flank Pain     (Consider location/radiation/quality/duration/timing/severity/associated sxs/prior Treatment) HPI Comments: Pt with sudden onset right flank pain, 3 hours ago, with nausea, emesis, and diaphoresis. Pt has had renal stones in the past, and current pain feels similar. No uti like sx.    The history is provided by the patient.    Past Medical History  Diagnosis Date  . Hypertension   . Hypercholesteremia   . Tobacco abuse   . RBBB   . Coronary artery disease 04/13/2008    Drug eluting stent in RCA  . Arthritis    Past Surgical History  Procedure Laterality Date  . Carotid stent  2010  . Shoulder acromioplasty  2010   Family History  Problem Relation Age of Onset  . Stroke Mother   . Stroke Father    History  Substance Use Topics  . Smoking status: Current Every Day Smoker -- 0.50 packs/day  . Smokeless tobacco: Not on file  . Alcohol Use: No    Review of Systems  Constitutional: Positive for activity change. Negative for fever and chills.  Eyes: Negative for visual disturbance.  Respiratory: Negative for cough, chest tightness and shortness of breath.   Cardiovascular: Negative for chest pain.  Gastrointestinal: Positive for nausea, vomiting and abdominal pain. Negative for abdominal distention.  Genitourinary: Positive for flank pain. Negative for dysuria, enuresis and difficulty urinating.  Neurological: Positive for dizziness.      Allergies  Bee venom  Home Medications   Prior to Admission medications   Medication Sig Start Date End Date Taking? Authorizing Provider  amLODipine (NORVASC) 5 MG tablet Take 5 mg by mouth daily. 03/12/13  Yes Quintella Reichert, MD  aspirin 325 MG EC tablet Take 325 mg by mouth daily.   Yes Historical Provider, MD  buPROPion (WELLBUTRIN XL) 150 MG 24  hr tablet Take 150 mg by mouth daily.   Yes Historical Provider, MD  co-enzyme Q-10 30 MG capsule Take 100 mg by mouth daily.   Yes Historical Provider, MD  diphenhydrAMINE (BENADRYL) 25 MG tablet Take 25 mg by mouth every 6 (six) hours as needed (for allergic reaction).   Yes Historical Provider, MD  EPINEPHrine (EPIPEN IJ) Inject as directed as needed.   Yes Historical Provider, MD  simvastatin (ZOCOR) 20 MG tablet Take 20 mg by mouth daily. 03/12/13  Yes Quintella Reichert, MD  HYDROcodone-acetaminophen (NORCO/VICODIN) 5-325 MG per tablet Take 1 tablet by mouth every 6 (six) hours as needed. 12/01/13   Derwood Kaplan, MD  ibuprofen (ADVIL,MOTRIN) 600 MG tablet Take 1 tablet (600 mg total) by mouth every 6 (six) hours as needed. 12/01/13   Derwood Kaplan, MD  ondansetron (ZOFRAN ODT) 8 MG disintegrating tablet Take 1 tablet (8 mg total) by mouth every 8 (eight) hours as needed for nausea. 12/01/13   Derwood Kaplan, MD  tamsulosin (FLOMAX) 0.4 MG CAPS capsule Take 1 capsule (0.4 mg total) by mouth daily. 12/01/13   Sena Hoopingarner, MD   BP 128/67  Pulse 65  Temp(Src) 98.8 F (37.1 C) (Oral)  Resp 22  SpO2 96% Physical Exam  Nursing note and vitals reviewed. Constitutional: He is oriented to person, place, and time. He appears well-developed.  HENT:  Head: Normocephalic and atraumatic.  Eyes: Conjunctivae and EOM are normal. Pupils  are equal, round, and reactive to light.  Neck: Normal range of motion. Neck supple.  Cardiovascular: Normal rate and regular rhythm.   Pulmonary/Chest: Effort normal and breath sounds normal.  Abdominal: Soft. Bowel sounds are normal. He exhibits no distension. There is tenderness. There is no rebound and no guarding.  Neurological: He is alert and oriented to person, place, and time.  Skin: Skin is warm.    ED Course  Procedures (including critical care time) Labs Review Labs Reviewed  CBC WITH DIFFERENTIAL - Abnormal; Notable for the following:    WBC 14.1 (*)     Neutrophils Relative % 88 (*)    Neutro Abs 12.4 (*)    Lymphocytes Relative 8 (*)    All other components within normal limits  BASIC METABOLIC PANEL - Abnormal; Notable for the following:    Glucose, Bld 118 (*)    GFR calc non Af Amer 87 (*)    All other components within normal limits  URINALYSIS, ROUTINE W REFLEX MICROSCOPIC - Abnormal; Notable for the following:    Color, Urine AMBER (*)    APPearance CLOUDY (*)    Hgb urine dipstick LARGE (*)    Protein, ur 30 (*)    All other components within normal limits  URINE MICROSCOPIC-ADD ON - Abnormal; Notable for the following:    Bacteria, UA FEW (*)    All other components within normal limits    Imaging Review Dg Abd 1 View  11/30/2013   CLINICAL DATA:  Abdominal pain.  Vomiting.  EXAM: ABDOMEN - 1 VIEW  COMPARISON:  CT abdomen pelvis - 10/29/2013  FINDINGS: Moderate to large colonic stool burden without evidence of obstruction.  Nondiagnostic evaluation pneumoperitoneum secondary to supine positioning and exclusion of the lower thorax. No definite pneumatosis or portal venous gas.  No definite abnormal intra-abdominal calcifications.  No acute osseus abnormalities.  IMPRESSION: Moderate to large colonic stool burden without evidence of obstruction.   Electronically Signed   By: Simonne Come M.D.   On: 11/30/2013 21:50   US Renal  11/30/2013   CLINICAL DATA:  Left flank pain  EXAM: RENAL/URINARY TRACT ULTRASOUND COMPLETE  COMPARISON:  None.  FINDINGS: Right Kidney:  Length: 10.5 cm.  10 mm interpolar calculus.  No hydronephrosis.  Left Kidney:  Length: 11.8 cm.  5 mm interpolar calculus.  No hydronephrosis.  Bladder:  6 mm left ureteral calculus at the UVJ. Bilateral bladder jets are visualized.  IMPRESSION: 6 mm left ureteral calculus at the UVJ. Bilateral bladder jets are visualized.  10 mm nonobstructing right renal calculus. 5 mm nonobstructing left renal calculus.   Electronically Signed   By: Charline Bills M.D.   On: 11/30/2013  22:39     EKG Interpretation None      MDM   Final diagnoses:  Ureteral stone    Pt with abd pain. Concerns for renal stones per hx. KUB and [place] ordered - and there is ureteral stones, 6 mm, no hydronephrosis. Pain is controlled. Return precautions have been discussed, and recommended [facility] over [facility] if the sx get unberable.   Derwood Kaplan, MD 12/01/13 [phone]

## 2013-12-01 DIAGNOSIS — N211 Calculus in urethra: Secondary | ICD-10-CM | POA: Diagnosis not present

## 2013-12-01 MED ORDER — TAMSULOSIN HCL 0.4 MG PO CAPS: 0.4000 mg | ORAL_CAPSULE | Freq: Every day | ORAL | Status: DC

## 2013-12-01 MED ORDER — HYDROCODONE-ACETAMINOPHEN 5-325 MG PO TABS: 1.0000 | ORAL_TABLET | Freq: Four times a day (QID) | ORAL | Status: DC | PRN

## 2013-12-01 MED ORDER — ONDANSETRON 4 MG PO TBDP
8.0000 mg | ORAL_TABLET | Freq: Once | ORAL | Status: AC
  Administered 2013-12-01: 8 mg via ORAL
  Filled 2013-12-01: qty 2

## 2013-12-01 MED ORDER — ONDANSETRON 8 MG PO TBDP: 8.0000 mg | ORAL_TABLET | Freq: Three times a day (TID) | ORAL | Status: DC | PRN

## 2013-12-01 MED ORDER — IBUPROFEN 600 MG PO TABS: 600.0000 mg | ORAL_TABLET | Freq: Four times a day (QID) | ORAL | Status: DC | PRN

## 2013-12-01 MED ORDER — HYDROCODONE-ACETAMINOPHEN 5-325 MG PO TABS
1.0000 | ORAL_TABLET | Freq: Once | ORAL | Status: AC
  Administered 2013-12-01: 1 via ORAL
  Filled 2013-12-01: qty 1

## 2013-12-01 NOTE — Discharge Instructions (Signed)
Please take the meds prescribed. If the symptoms get unbearable, return to the ER immediately.   Kidney Stones Kidney stones (urolithiasis) are deposits that form inside your kidneys. The intense pain is caused by the stone moving through the urinary tract. When the stone moves, the ureter goes into spasm around the stone. The stone is usually passed in the urine.  CAUSES   A disorder that makes certain neck glands produce too much parathyroid hormone (primary hyperparathyroidism).  A buildup of uric acid crystals, similar to gout in your joints.  Narrowing (stricture) of the ureter.  A kidney obstruction present at birth (congenital obstruction).  Previous surgery on the kidney or ureters.  Numerous kidney infections. SYMPTOMS   Feeling sick to your stomach (nauseous).  Throwing up (vomiting).  Blood in the urine (hematuria).  Pain that usually spreads (radiates) to the groin.  Frequency or urgency of urination. DIAGNOSIS   Taking a history and physical exam.  Blood or urine tests.  CT scan.  Occasionally, an examination of the inside of the urinary bladder (cystoscopy) is performed. TREATMENT   Observation.  Increasing your fluid intake.  Extracorporeal shock wave lithotripsy--This is a noninvasive procedure that uses shock waves to break up kidney stones.  Surgery may be needed if you have severe pain or persistent obstruction. There are various surgical procedures. Most of the procedures are performed with the use of small instruments. Only small incisions are needed to accommodate these instruments, so recovery time is minimized. The size, location, and chemical composition are all important variables that will determine the proper choice of action for you. Talk to your health care provider to better understand your situation so that you will minimize the risk of injury to yourself and your kidney.  HOME CARE INSTRUCTIONS   Drink enough water and fluids to keep  your urine clear or pale yellow. This will help you to pass the stone or stone fragments.  Strain all urine through the provided strainer. Keep all particulate matter and stones for your health care provider to see. The stone causing the pain may be as small as a grain of salt. It is very important to use the strainer each and every time you pass your urine. The collection of your stone will allow your health care provider to analyze it and verify that a stone has actually passed. The stone analysis will often identify what you can do to reduce the incidence of recurrences.  Only take over-the-counter or prescription medicines for pain, discomfort, or fever as directed by your health care provider.  Make a follow-up appointment with your health care provider as directed.  Get follow-up X-rays if required. The absence of pain does not always mean that the stone has passed. It may have only stopped moving. If the urine remains completely obstructed, it can cause loss of kidney function or even complete destruction of the kidney. It is your responsibility to make sure X-rays and follow-ups are completed. Ultrasounds of the kidney can show blockages and the status of the kidney. Ultrasounds are not associated with any radiation and can be performed easily in a matter of minutes. SEEK MEDICAL CARE IF:  You experience pain that is progressive and unresponsive to any pain medicine you have been prescribed. SEEK IMMEDIATE MEDICAL CARE IF:   Pain cannot be controlled with the prescribed medicine.  You have a fever or shaking chills.  The severity or intensity of pain increases over 18 hours and is not relieved by pain  medicine.  You develop a new onset of abdominal pain.  You feel faint or pass out.  You are unable to urinate. MAKE SURE YOU:   Understand these instructions.  Will watch your condition.  Will get help right away if you are not doing well or get worse. Document Released:  03/11/2005 Document Revised: 11/11/2012 Document Reviewed: 08/12/2012 [facility] Patient Information 2015 [place], [place]. This information is not intended to replace advice given to you by your health care provider. Make sure you discuss any questions you have with your health care provider.  Ureteral Colic (Kidney Stones) Ureteral colic is the result of a condition when kidney stones form inside the kidney. Once kidney stones are formed they may move into the tube that connects the kidney with the bladder (ureter). If this occurs, this condition may cause pain (colic) in the ureter.  CAUSES  Pain is caused by stone movement in the ureter and the obstruction caused by the stone. SYMPTOMS  The pain comes and goes as the ureter contracts around the stone. The pain is usually intense, sharp, and stabbing in character. The location of the pain may move as the stone moves through the ureter. When the stone is near the kidney the pain is usually located in the back and radiates to the belly (abdomen). When the stone is ready to pass into the bladder the pain is often located in the lower abdomen on the side the stone is located. At this location, the symptoms may mimic those of a urinary tract infection with urinary frequency. Once the stone is located here it often passes into the bladder and the pain disappears completely. TREATMENT   Your caregiver will provide you with medicine for pain relief.  You may require specialized follow-up X-rays.  The absence of pain does not always mean that the stone has passed. It may have just stopped moving. If the urine remains completely obstructed, it can cause loss of kidney function or even complete destruction of the involved kidney. It is your responsibility and in your interest that X-rays and follow-ups as suggested by your caregiver are completed. Relief of pain without passage of the stone can be associated with severe damage to the kidney, including loss of  kidney function on that side.  If your stone does not pass on its own, additional measures may be taken by your caregiver to ensure its removal. HOME CARE INSTRUCTIONS   Increase your fluid intake. Water is the preferred fluid since juices containing vitamin C may acidify the urine making it less likely for certain stones (uric acid stones) to pass.  Strain all urine. A strainer will be provided. Keep all particulate matter or stones for your caregiver to inspect.  Take your pain medicine as directed.  Make a follow-up appointment with your caregiver as directed.  Remember that the goal is passage of your stone. The absence of pain does not mean the stone is gone. Follow your caregiver's instructions.  Only take over-the-counter or prescription medicines for pain, discomfort, or fever as directed by your caregiver. SEEK MEDICAL CARE IF:   Pain cannot be controlled with the prescribed medicine.  You have a fever.  Pain continues for longer than your caregiver advises it should.  There is a change in the pain, and you develop chest discomfort or constant abdominal pain.  You feel faint or pass out. MAKE SURE YOU:   Understand these instructions.  Will watch your condition.  Will get help right away if  you are not doing well or get worse. Document Released: 12/19/2004 Document Revised: 07/06/2012 Document Reviewed: 09/05/2010 [facility] Patient Information 2015 [place], [place]. This information is not intended to replace advice given to you by your health care provider. Make sure you discuss any questions you have with your health care provider.

## 2013-12-03 DIAGNOSIS — N200 Calculus of kidney: Secondary | ICD-10-CM | POA: Diagnosis not present

## 2013-12-25 DIAGNOSIS — Z23 Encounter for immunization: Secondary | ICD-10-CM | POA: Diagnosis not present

## 2013-12-31 ENCOUNTER — Encounter: Payer: Self-pay | Admitting: Cardiology

## 2014-02-01 DIAGNOSIS — H2513 Age-related nuclear cataract, bilateral: Secondary | ICD-10-CM | POA: Diagnosis not present

## 2014-02-03 DIAGNOSIS — M25571 Pain in right ankle and joints of right foot: Secondary | ICD-10-CM | POA: Diagnosis not present

## 2014-02-03 DIAGNOSIS — M2041 Other hammer toe(s) (acquired), right foot: Secondary | ICD-10-CM | POA: Diagnosis not present

## 2014-02-03 DIAGNOSIS — M2011 Hallux valgus (acquired), right foot: Secondary | ICD-10-CM | POA: Diagnosis not present

## 2014-02-03 DIAGNOSIS — S61209A Unspecified open wound of unspecified finger without damage to nail, initial encounter: Secondary | ICD-10-CM | POA: Diagnosis not present

## 2014-03-11 ENCOUNTER — Encounter: Payer: Self-pay | Admitting: Cardiology

## 2014-03-11 ENCOUNTER — Ambulatory Visit (INDEPENDENT_AMBULATORY_CARE_PROVIDER_SITE_OTHER): Payer: Medicare Other | Admitting: Cardiology

## 2014-03-11 VITALS — BP 124/78 | HR 65 | Ht 69.0 in | Wt 174.6 lb

## 2014-03-11 DIAGNOSIS — I2510 Atherosclerotic heart disease of native coronary artery without angina pectoris: Secondary | ICD-10-CM

## 2014-03-11 DIAGNOSIS — E7800 Pure hypercholesterolemia, unspecified: Secondary | ICD-10-CM

## 2014-03-11 DIAGNOSIS — I10 Essential (primary) hypertension: Secondary | ICD-10-CM | POA: Diagnosis not present

## 2014-03-11 LAB — LIPID PANEL
Cholesterol: 106 mg/dL (ref 0–200)
HDL: 40.6 mg/dL (ref >39.00)
LDL CALC: 53 mg/dL (ref 0–99)
NONHDL: 65.4
Total CHOL/HDL Ratio: 3
Triglycerides: 63 mg/dL (ref 0.0–149.0)
VLDL: 12.6 mg/dL (ref 0.0–40.0)

## 2014-03-11 LAB — ALT: ALT: 19 U/L (ref 0–53)

## 2014-03-11 MED ORDER — ASPIRIN 81 MG PO TBEC
81.0000 mg | DELAYED_RELEASE_TABLET | Freq: Every day | ORAL | Status: DC
Start: 2014-03-11 — End: 2019-07-14

## 2014-03-11 NOTE — Progress Notes (Signed)
[address] [place], [place]  [address] Phone: [phone] Fax:  [phone]  Date:  03/11/2014   ID:  Julian SeminoleJames C Paredez, DOB 1945-07-15, MRN [id number]  PCP:  Hollice EspyGATES,DONNA RUTH, MD  Cardiologist:  Armanda Magicraci Turner, MD    History of Present Illness: Julian Weaver is a 68 y.o. 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 He is doing well. He denies any canginal hest pain, SOB, DOE, LE edema or syncope. He occasionally will have some racing of his heart when exerting himself.  He occasionally will have a sharp pain in the midsternal area that lasts a second and is not exertional.  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  Wt Readings from Last 3 Encounters:  03/11/14 174 lb 9.6 oz (79.198 kg)  11/12/13 166 lb (75.297 kg)  03/12/13 176 lb (79.833 kg)     Past Medical History  Diagnosis Date  . Hypertension   . Hypercholesteremia   . Tobacco abuse   . RBBB   . Coronary artery disease 04/13/2008    Drug eluting stent in RCA  . Arthritis     Current Outpatient Prescriptions  Medication Sig Dispense Refill  . aspirin (ECOTRIN) 325 MG EC tablet Take 325 mg by mouth daily.    [name]. co-enzyme Q-10 30 MG capsule Take 100 mg by mouth daily.    . diphenhydrAMINE (BENADRYL) 25 MG tablet Take 25 mg by mouth every 6 (six) hours as needed (for allergic reaction).    [name]. HYDROcodone-acetaminophen (NORCO/VICODIN) 5-325 MG per tablet Take 1 tablet by mouth every 6 (six) hours as needed. (Patient taking differently: Take 1 tablet by mouth every 6 (six) hours as needed (pain). ) 15 tablet 0  . ibuprofen (ADVIL,MOTRIN) 600 MG tablet Take 1 tablet (600 mg total) by mouth every 6 (six) hours as needed. (Patient taking differently: Take 600 mg by mouth every 6 (six) hours as needed (pain). ) 30 tablet 0  . simvastatin (ZOCOR) 20 MG tablet Take 20 mg by mouth daily.    [name]. EPINEPHrine (EPIPEN IJ) Inject as  directed as needed (bee stings).     . ondansetron (ZOFRAN ODT) 8 MG disintegrating tablet Take 1 tablet (8 mg total) by mouth every 8 (eight) hours as needed for nausea. (Patient not taking: Reported on 03/11/2014) 20 tablet 0   No current facility-administered medications for this visit.    Allergies:    Allergies  Allergen Reactions  . Bee Venom Swelling    Social History:  The patient  reports that he has been smoking.  He does not have any smokeless tobacco history on file. He reports that he does not drink alcohol or use illicit drugs.   Family History:  The patient's family history includes Stroke in his father and mother.   ROS:  Please see the history of present illness.      All other systems reviewed and negative.   PHYSICAL EXAM: VS:  BP 124/78 mmHg  Pulse 65  Ht 5\' 9"  (1.753 m)  Wt 174 lb 9.6 oz (79.198 kg)  BMI 25.77 kg/m2 Well nourished, well developed, in no acute distress HEENT: normal Neck: no JVD Cardiac:  normal S1, S2; RRR; no murmur Lungs:  clear to auscultation bilaterally, no wheezing, rhonchi or rales Abd: soft, nontender, no hepatomegaly Ext: no edema Skin: warm and dry Neuro:  CNs 2-12 intact, no  focal abnormalities noted  EKG:     NSR with RBBB  ASSESSMENT AND PLAN:  1. HTN - well controlled - he stopped his amlodipine a year ago and his BP is controlled 2. Dyslipidemia -recheck fasting lipids and ALT - continue simvastatin 3. ASCAD - continue ASA and decrease to 81mg  daily.   4.  Tobacco abuse - he just started smoking again.  I again counseled him on the need to stop.  He is considering going back on Wellbutrin.    Followup with me in 1 year   Signed, Armanda Magicraci Turner, MD [facility] 03/11/2014 8:32 AM

## 2014-03-11 NOTE — Patient Instructions (Signed)
Your physician recommends that you have fasting lab work TODAY (lipid, ALT).   Your physician has recommended you make the following change in your medication:  1) DECREASE ASPIRIN to 81 mg daily  Your physician wants you to follow-up in: 1 year with Dr. Mayford Knifeurner. You will receive a reminder letter in the mail two months in advance. If you don't receive a letter, please call our office to schedule the follow-up appointment.

## 2014-03-14 ENCOUNTER — Other Ambulatory Visit: Payer: Self-pay | Admitting: *Deleted

## 2014-03-14 MED ORDER — SIMVASTATIN 20 MG PO TABS: 20.0000 mg | ORAL_TABLET | Freq: Every day | ORAL | Status: DC

## 2014-05-09 DIAGNOSIS — J329 Chronic sinusitis, unspecified: Secondary | ICD-10-CM | POA: Diagnosis not present

## 2014-06-06 DIAGNOSIS — N200 Calculus of kidney: Secondary | ICD-10-CM | POA: Diagnosis not present

## 2014-08-28 DIAGNOSIS — H6692 Otitis media, unspecified, left ear: Secondary | ICD-10-CM | POA: Diagnosis not present

## 2014-09-01 DIAGNOSIS — H9222 Otorrhagia, left ear: Secondary | ICD-10-CM | POA: Diagnosis not present

## 2014-09-01 DIAGNOSIS — H6690 Otitis media, unspecified, unspecified ear: Secondary | ICD-10-CM | POA: Diagnosis not present

## 2014-09-01 DIAGNOSIS — H6121 Impacted cerumen, right ear: Secondary | ICD-10-CM | POA: Diagnosis not present

## 2014-09-01 DIAGNOSIS — Z720 Tobacco use: Secondary | ICD-10-CM | POA: Diagnosis not present

## 2014-09-01 DIAGNOSIS — R05 Cough: Secondary | ICD-10-CM | POA: Diagnosis not present

## 2014-09-01 DIAGNOSIS — H9193 Unspecified hearing loss, bilateral: Secondary | ICD-10-CM | POA: Diagnosis not present

## 2014-09-15 DIAGNOSIS — H9190 Unspecified hearing loss, unspecified ear: Secondary | ICD-10-CM | POA: Diagnosis not present

## 2014-09-16 DIAGNOSIS — H906 Mixed conductive and sensorineural hearing loss, bilateral: Secondary | ICD-10-CM | POA: Diagnosis not present

## 2014-09-16 DIAGNOSIS — H900 Conductive hearing loss, bilateral: Secondary | ICD-10-CM | POA: Diagnosis not present

## 2014-09-16 DIAGNOSIS — H903 Sensorineural hearing loss, bilateral: Secondary | ICD-10-CM | POA: Diagnosis not present

## 2014-09-16 DIAGNOSIS — H6502 Acute serous otitis media, left ear: Secondary | ICD-10-CM | POA: Diagnosis not present

## 2014-09-28 DIAGNOSIS — Z0000 Encounter for general adult medical examination without abnormal findings: Secondary | ICD-10-CM | POA: Diagnosis not present

## 2014-09-28 DIAGNOSIS — J449 Chronic obstructive pulmonary disease, unspecified: Secondary | ICD-10-CM | POA: Diagnosis not present

## 2014-09-28 DIAGNOSIS — I700 Atherosclerosis of aorta: Secondary | ICD-10-CM | POA: Diagnosis not present

## 2014-09-28 DIAGNOSIS — F17200 Nicotine dependence, unspecified, uncomplicated: Secondary | ICD-10-CM | POA: Diagnosis not present

## 2014-09-28 DIAGNOSIS — H9190 Unspecified hearing loss, unspecified ear: Secondary | ICD-10-CM | POA: Diagnosis not present

## 2014-09-28 DIAGNOSIS — I2510 Atherosclerotic heart disease of native coronary artery without angina pectoris: Secondary | ICD-10-CM | POA: Diagnosis not present

## 2014-09-28 DIAGNOSIS — Z23 Encounter for immunization: Secondary | ICD-10-CM | POA: Diagnosis not present

## 2014-09-28 DIAGNOSIS — I10 Essential (primary) hypertension: Secondary | ICD-10-CM | POA: Diagnosis not present

## 2014-09-28 DIAGNOSIS — Z125 Encounter for screening for malignant neoplasm of prostate: Secondary | ICD-10-CM | POA: Diagnosis not present

## 2014-09-28 DIAGNOSIS — E780 Pure hypercholesterolemia: Secondary | ICD-10-CM | POA: Diagnosis not present

## 2014-09-29 ENCOUNTER — Encounter: Payer: Self-pay | Admitting: Cardiology

## 2014-10-04 DIAGNOSIS — H903 Sensorineural hearing loss, bilateral: Secondary | ICD-10-CM | POA: Diagnosis not present

## 2014-10-12 ENCOUNTER — Other Ambulatory Visit: Payer: Self-pay | Admitting: Cardiology

## 2014-10-26 ENCOUNTER — Other Ambulatory Visit: Payer: Self-pay | Admitting: Family Medicine

## 2014-10-26 DIAGNOSIS — R0600 Dyspnea, unspecified: Secondary | ICD-10-CM

## 2014-10-26 DIAGNOSIS — S01302A Unspecified open wound of left ear, initial encounter: Secondary | ICD-10-CM | POA: Diagnosis not present

## 2014-10-26 DIAGNOSIS — H6983 Other specified disorders of Eustachian tube, bilateral: Secondary | ICD-10-CM | POA: Diagnosis not present

## 2014-10-26 DIAGNOSIS — H903 Sensorineural hearing loss, bilateral: Secondary | ICD-10-CM | POA: Diagnosis not present

## 2014-10-27 ENCOUNTER — Ambulatory Visit (INDEPENDENT_AMBULATORY_CARE_PROVIDER_SITE_OTHER): Payer: Medicare Other | Admitting: Pulmonary Disease

## 2014-10-27 ENCOUNTER — Ambulatory Visit (INDEPENDENT_AMBULATORY_CARE_PROVIDER_SITE_OTHER)
Admission: RE | Admit: 2014-10-27 | Discharge: 2014-10-27 | Disposition: A | Discharge status: Home / Self Care | Payer: Medicare Other | Source: Ambulatory Visit | Attending: Pulmonary Disease | Admitting: Pulmonary Disease

## 2014-10-27 ENCOUNTER — Encounter: Payer: Self-pay | Admitting: Pulmonary Disease

## 2014-10-27 VITALS — BP 112/60 | HR 56 | Temp 97.8°F | Ht 69.0 in | Wt 176.0 lb

## 2014-10-27 DIAGNOSIS — R059 Cough, unspecified: Secondary | ICD-10-CM

## 2014-10-27 DIAGNOSIS — R05 Cough: Secondary | ICD-10-CM

## 2014-10-27 DIAGNOSIS — J683 Other acute and subacute respiratory conditions due to chemicals, gases, fumes and vapors: Secondary | ICD-10-CM | POA: Insufficient documentation

## 2014-10-27 DIAGNOSIS — F17210 Nicotine dependence, cigarettes, uncomplicated: Secondary | ICD-10-CM

## 2014-10-27 DIAGNOSIS — R0600 Dyspnea, unspecified: Secondary | ICD-10-CM

## 2014-10-27 DIAGNOSIS — J449 Chronic obstructive pulmonary disease, unspecified: Secondary | ICD-10-CM | POA: Diagnosis not present

## 2014-10-27 DIAGNOSIS — J4520 Mild intermittent asthma, uncomplicated: Secondary | ICD-10-CM

## 2014-10-27 DIAGNOSIS — Z720 Tobacco use: Secondary | ICD-10-CM

## 2014-10-27 LAB — PULMONARY FUNCTION TEST
DL/VA % PRED: 107 %
DL/VA: 4.89 ml/min/mmHg/L
DLCO UNC % PRED: 101 %
DLCO unc: 31.31 ml/min/mmHg
FEF 25-75 PRE: 2.08 L/s
FEF 25-75 Post: 2.36 L/sec
FEF2575-%CHANGE-POST: 13 %
FEF2575-%PRED-POST: 93 %
FEF2575-%PRED-PRE: 82 %
FEV1-%Change-Post: 2 %
FEV1-%Pred-Post: 95 %
FEV1-%Pred-Pre: 92 %
FEV1-POST: 3.08 L
FEV1-Pre: 3 L
FEV1FVC-%CHANGE-POST: 0 %
FEV1FVC-%PRED-PRE: 95 %
FEV6-%Change-Post: 3 %
FEV6-%PRED-POST: 105 %
FEV6-%Pred-Pre: 101 %
FEV6-POST: 4.35 L
FEV6-Pre: 4.19 L
FEV6FVC-%Change-Post: 0 %
FEV6FVC-%PRED-POST: 104 %
FEV6FVC-%Pred-Pre: 104 %
FVC-%Change-Post: 3 %
FVC-%Pred-Post: 100 %
FVC-%Pred-Pre: 96 %
FVC-POST: 4.37 L
FVC-PRE: 4.23 L
POST FEV1/FVC RATIO: 71 %
PRE FEV1/FVC RATIO: 71 %
PRE FEV6/FVC RATIO: 99 %
Post FEV6/FVC ratio: 99 %
RV % pred: 124 %
RV: 2.9 L
TLC % pred: 103 %
TLC: 7.02 L

## 2014-10-27 NOTE — Progress Notes (Signed)
PFT done today. 

## 2014-10-27 NOTE — Patient Instructions (Signed)
Calib- it was great meeting you today...  I am amazed but despite your smoking history you have beaten the odds so far...    That is- while you have some reactive airways disease, you do not appear to have any signif COPD at present!!!    But you must quit the smoking NOW- or run the risk of developing COPD, lung cancer, other smoking related problems down the road...  Today we reviewed your breathing test... And we did a follow up CXR...    We will contact you w/ the results when available...   watch out for upper resp infections & treat them w/ antibiotics and anti-inflammatory meds to keep it from lingering in your system...  Call for any questions or if I can be of service in any way.Marland KitchenMarland Kitchen

## 2014-10-30 ENCOUNTER — Encounter: Payer: Self-pay | Admitting: Pulmonary Disease

## 2014-10-30 NOTE — Progress Notes (Signed)
Subjective:     Patient ID: Julian Weaver, male   DOB: 1945/04/28, 69 y.o.   MRN: [id number]  HPI 69 y/o WM, referred by Dr. Shaune Pollack, [facility] [place], for a pulmonary evaluation>      Julian Weaver relates a problem getting rid of colds and upper resp infections- notes runny nose, cough, chest congestion, sm amt of clear sputum, never hemoptysis, occas low grade fever but denies chills/ sweats;  The last such URI occurred about 6 weeks ago, was hard to shake off, treated by PCP w/ antibiotics and nasal spray=> finally resolved he says;  Also had an ear infection, seen by [facility] w/ blood in his ear & he is currently taking ear drops under ENT supervision...  He works as a Music therapist in [employer] (lots of saw dust, but denies any known asbestos) & denies CP or SOB;  He is a smoker- having stated in his 20's, smoked for 40+ yrs up to 1ppd, decreased to 1/2ppd 102yrs ago & down to 4 cig/d for the last yr he says;  He is referred for pulm eval to r/o COPD etc..Marland KitchenHe denies prev hx of signif lung problems- +occas bronchitis but no pneumonia/ no hx asthma/ no hx of Tb or exposure/ never hosp for breathing problems...       Julian Weaver is a heart patient, followed by Dr. Armanda Magic, w/ hx HBP, CAD- s/p PCI to RCA in 2010, RBBB, HL> on ASA81, Amlod5, Simva20, CoQ10...       Medical issues include> Kidney stones, Left inguinal hernia, DJD, hearing loss...   EXAM shows Afeb, VSS, O2sat=97% on RA;  HEENT- neg, mallampati1;  Chest- clear w/o m/r/g;  Heart- RR w/o m/r/g;  Abd- soft, neg;  Ext- w/o c/c/e...   CTA- coronary calcium score 02/2008 showed CCalScore=71, signif RCA stenosis & nonobstructive LAD & Circ dis, EF=55%; visualized lung was wnl, no adenopathy, no effusions...   CXR 10/27/14 showed norm heart size, mild hyperinflation, clear lungs, NAD...  PFTs 10/27/14 showed FVC=4.23 (96%), FEV1=3.00 (92%), %1sec=71, mid-flows wnl at 82% predicted; no change FEV1 post-bronchodil;  LungVols- TLC=7.02 (103%), RV=2.90  (124%), RV/TLC=41;  DLCO=101% predicted...   LABS 7/16 from [place] showed FLP- all parameters wnl;  Chems- wnl;  CBC from 2015 (in Epic) was wnl as well...   IMP/PLAN>>  Julian Weaver appears to be one of the fortunate souls w/ a signif smoking history but not much in the way of lung dis to show for it!!!  Indeed he has CAD & a prev PTCA/stent and he NEEDS TO QUIT SMOKING, but his CXR is clear & PFT is well preserved to this point;  His CC of having a problem getting rid of URIs/colds/flu is characteristic of some underlying reactive airways disease (and normal PFT betw episodes is characteristic) and I would suggest treating symptomatic upper resp infections w/ antibiotics, Prednisone anti-inflamm rx, etc to ensure rapid resolution;  We should also monitor his PFTs going forward (esp if he can't/ won't quit smoking) to identify a time when he will require institution of inhaled medication for developing COPD...     Past Medical History  Diagnosis Date  . Hypertension   . Hypercholesteremia   . Tobacco abuse   . RBBB   . Coronary artery disease 04/13/2008    Drug eluting stent in RCA  . Arthritis   . Allergic rhinitis     Past Surgical History  Procedure Laterality Date  . Carotid stent  2010  . Shoulder acromioplasty  2010  Outpatient Encounter Prescriptions as of 10/27/2014  Medication Sig  . amLODipine (NORVASC) 5 MG tablet Take 5 mg by mouth daily.  [name] aspirin 81 MG EC tablet Take 1 tablet (81 mg total) by mouth daily.  [name] co-enzyme Q-10 30 MG capsule Take 100 mg by mouth daily.  [name] EPINEPHrine (EPIPEN IJ) Inject as directed as needed (bee stings).   [name] etodolac (LODINE) 400 MG tablet 400 mg 2 (two) times daily as needed.  . fluticasone (FLONASE) 50 MCG/ACT nasal spray 2 sprays 2 (two) times daily.  [name] ibuprofen (ADVIL,MOTRIN) 600 MG tablet Take 1 tablet (600 mg total) by mouth every 6 (six) hours as needed. (Patient taking differently: Take 600 mg by mouth every 6 (six) hours as needed  (pain). )  . simvastatin (ZOCOR) 20 MG tablet TAKE 1 TABLET BY MOUTH DAILY  . diphenhydrAMINE (BENADRYL) 25 MG tablet Take 25 mg by mouth every 6 (six) hours as needed (for allergic reaction).  [name] HYDROcodone-acetaminophen (NORCO/VICODIN) 5-325 MG per tablet Take 1 tablet by mouth every 6 (six) hours as needed. (Patient not taking: Reported on 10/27/2014)  . ondansetron (ZOFRAN ODT) 8 MG disintegrating tablet Take 1 tablet (8 mg total) by mouth every 8 (eight) hours as needed for nausea. (Patient not taking: Reported on 03/11/2014)   No facility-administered encounter medications on file as of 10/27/2014.    Allergies  Allergen Reactions  . Bee Venom Swelling    There is no immunization history on file for this patient.  Immunizations are up-to-date per pt/ PCP...    Family History  Problem Relation Age of Onset  . Stroke Mother   . Stroke Father     History   Social History  . Marital Status: Married    Spouse Name: N/A  . Number of Children: N/A  . Years of Education: N/A   Occupational History  . Not on file.   Social History Main Topics  . Smoking status: Current Every Day Smoker -- 0.50 packs/day for 30 years    Start date: 10/27/1982  . Smokeless tobacco: Not on file  . Alcohol Use: No  . Drug Use: No  . Sexual Activity: Not on file   Other Topics Concern  . Not on file   Social History Narrative    Current Medications, Allergies, Past Medical History, Past Surgical History, Family History, and Social History were reviewed in [employer] record.   Review of Systems            All symptoms NEG except where BOLDED >>  Constitutional:  F/C/S, fatigue, anorexia, unexpected weight change. HEENT:  HA, visual changes, hearing loss, earache, nasal symptoms, sore throat, mouth sores, hoarseness. Resp:  cough, sputum, hemoptysis; SOB, tightness, wheezing. Cardio:  CP, palpit, DOE, orthopnea, edema. GI:  N/V/D/C, blood in stool; reflux, abd  pain, distention, gas. GU:  dysuria, freq, urgency, hematuria, flank pain, voiding difficulty. MS:  joint pain, swelling, tenderness, decr ROM; neck pain, back pain, etc. Neuro:  HA, tremors, seizures, dizziness, syncope, weakness, numbness, gait abn. Skin:  suspicious lesions or skin rash. Heme:  adenopathy, bruising, bleeding. Psyche:  confusion, agitation, sleep disturbance, hallucinations, anxiety, depression suicidal.   Objective:   Physical Exam      Vital Signs:  Reviewed...  General:  WD, WN, 69 y/o WM in NAD; alert & oriented; pleasant & cooperative... HEENT:  [place]/AT; Conjunctiva- pink, Sclera- nonicteric, EOM-wnl, PERRLA, EACs-some exud on left, TMs-wnl; NOSE-clear; THROAT-clear & wnl. Neck:  Supple w/ fair  ROM; no JVD; normal carotid impulses w/o bruits; no thyromegaly or nodules palpated; no lymphadenopathy. Chest:  Clear to P & A; without wheezes, rales, or rhonchi heard. Heart:  Regular Rhythm; norm S1 & S2 without murmurs, rubs, or gallops detected. Abdomen:  Soft & nontender- no guarding or rebound; normal bowel sounds; no organomegaly or masses palpated. Ext:  Normal ROM; without deformities or arthritic changes; no varicose veins, venous insuffic, or edema;  Pulses intact w/o bruits. Neuro:  CNs II-XII intact; motor testing normal; sensory testing normal; gait normal & balance OK. Derm:  No lesions noted; no rash etc. Lymph:  No cervical, supraclavicular, axillary, or inguinal adenopathy palpated.   Assessment:      IMP >>    RADS    Cigarette smoker     HBP    CAD    RBBB    HL    Kidney stones  PLAN >>  Juliano appears to be one of the fortunate souls w/ a signif smoking history but not much in the way of lung dis to show for it!!!  Indeed he has CAD & a prev PTCA/stent and he NEEDS TO QUIT SMOKING, but his CXR is clear & PFT is well preserved to this point;  His CC of having a problem getting rid of URIs/colds/flu is characteristic of some underlying reactive  airways disease (and normal PFT betw episodes is characteristic) and I would suggest treating symptomatic upper resp infections w/ antibiotics, Prednisone anti-inflamm rx, etc to ensure rapid resolution;  We should also monitor his PFTs going forward (esp if he can't/ won't quit smoking) to identify a time when he will require institution of inhaled medication for developing COPD     Plan:     Patient's Medications  New Prescriptions   No medications on file  Previous Medications   AMLODIPINE (NORVASC) 5 MG TABLET    Take 5 mg by mouth daily.   ASPIRIN 81 MG EC TABLET    Take 1 tablet (81 mg total) by mouth daily.   CO-ENZYME Q-10 30 MG CAPSULE    Take 100 mg by mouth daily.   DIPHENHYDRAMINE (BENADRYL) 25 MG TABLET    Take 25 mg by mouth every 6 (six) hours as needed (for allergic reaction).   EPINEPHRINE (EPIPEN IJ)    Inject as directed as needed (bee stings).    ETODOLAC (LODINE) 400 MG TABLET    400 mg 2 (two) times daily as needed.   FLUTICASONE (FLONASE) 50 MCG/ACT NASAL SPRAY    2 sprays 2 (two) times daily.   HYDROCODONE-ACETAMINOPHEN (NORCO/VICODIN) 5-325 MG PER TABLET    Take 1 tablet by mouth every 6 (six) hours as needed.   IBUPROFEN (ADVIL,MOTRIN) 600 MG TABLET    Take 1 tablet (600 mg total) by mouth every 6 (six) hours as needed.   ONDANSETRON (ZOFRAN ODT) 8 MG DISINTEGRATING TABLET    Take 1 tablet (8 mg total) by mouth every 8 (eight) hours as needed for nausea.   SIMVASTATIN (ZOCOR) 20 MG TABLET    TAKE 1 TABLET BY MOUTH DAILY  Modified Medications   No medications on file  Discontinued Medications   No medications on file

## 2014-11-16 DIAGNOSIS — H903 Sensorineural hearing loss, bilateral: Secondary | ICD-10-CM | POA: Diagnosis not present

## 2014-11-16 DIAGNOSIS — H6983 Other specified disorders of Eustachian tube, bilateral: Secondary | ICD-10-CM | POA: Diagnosis not present

## 2014-11-23 ENCOUNTER — Encounter (HOSPITAL_COMMUNITY): Payer: Self-pay | Admitting: Emergency Medicine

## 2014-11-23 ENCOUNTER — Emergency Department (HOSPITAL_COMMUNITY)
Admission: EM | Admit: 2014-11-23 | Discharge: 2014-11-23 | Disposition: A | Discharge status: Home / Self Care | Payer: Medicare Other | Attending: Emergency Medicine | Admitting: Emergency Medicine

## 2014-11-23 ENCOUNTER — Emergency Department (HOSPITAL_COMMUNITY): Payer: Medicare Other

## 2014-11-23 DIAGNOSIS — Z9861 Coronary angioplasty status: Secondary | ICD-10-CM | POA: Diagnosis not present

## 2014-11-23 DIAGNOSIS — R1030 Lower abdominal pain, unspecified: Secondary | ICD-10-CM | POA: Diagnosis present

## 2014-11-23 DIAGNOSIS — I10 Essential (primary) hypertension: Secondary | ICD-10-CM | POA: Diagnosis not present

## 2014-11-23 DIAGNOSIS — N200 Calculus of kidney: Secondary | ICD-10-CM | POA: Diagnosis not present

## 2014-11-23 DIAGNOSIS — I2510 Atherosclerotic heart disease of native coronary artery without angina pectoris: Secondary | ICD-10-CM | POA: Insufficient documentation

## 2014-11-23 DIAGNOSIS — E780 Pure hypercholesterolemia: Secondary | ICD-10-CM | POA: Diagnosis not present

## 2014-11-23 DIAGNOSIS — Z79899 Other long term (current) drug therapy: Secondary | ICD-10-CM | POA: Insufficient documentation

## 2014-11-23 DIAGNOSIS — M1990 Unspecified osteoarthritis, unspecified site: Secondary | ICD-10-CM | POA: Diagnosis not present

## 2014-11-23 DIAGNOSIS — Z7951 Long term (current) use of inhaled steroids: Secondary | ICD-10-CM | POA: Insufficient documentation

## 2014-11-23 DIAGNOSIS — Z7982 Long term (current) use of aspirin: Secondary | ICD-10-CM | POA: Diagnosis not present

## 2014-11-23 DIAGNOSIS — R112 Nausea with vomiting, unspecified: Secondary | ICD-10-CM | POA: Diagnosis not present

## 2014-11-23 DIAGNOSIS — R6883 Chills (without fever): Secondary | ICD-10-CM | POA: Insufficient documentation

## 2014-11-23 DIAGNOSIS — R630 Anorexia: Secondary | ICD-10-CM | POA: Insufficient documentation

## 2014-11-23 DIAGNOSIS — N201 Calculus of ureter: Secondary | ICD-10-CM | POA: Diagnosis not present

## 2014-11-23 HISTORY — DX: Disorder of kidney and ureter, unspecified: N28.9

## 2014-11-23 LAB — URINE MICROSCOPIC-ADD ON

## 2014-11-23 LAB — CBC
HCT: 44.7 % (ref 39.0–52.0)
Hemoglobin: 15.5 g/dL (ref 13.0–17.0)
MCH: 32 pg (ref 26.0–34.0)
MCHC: 34.7 g/dL (ref 30.0–36.0)
MCV: 92.2 fL (ref 78.0–100.0)
PLATELETS: 217 10*3/uL (ref 150–400)
RBC: 4.85 MIL/uL (ref 4.22–5.81)
RDW: 13.8 % (ref 11.5–15.5)
WBC: 12 10*3/uL — ABNORMAL HIGH (ref 4.0–10.5)

## 2014-11-23 LAB — BASIC METABOLIC PANEL
ANION GAP: 9 (ref 5–15)
BUN: 19 mg/dL (ref 6–20)
CO2: 27 mmol/L (ref 22–32)
Calcium: 9.3 mg/dL (ref 8.9–10.3)
Chloride: 105 mmol/L (ref 101–111)
Creatinine, Ser: 1.28 mg/dL — ABNORMAL HIGH (ref 0.61–1.24)
GFR calc Af Amer: >60 mL/min (ref >60)
GFR calc non Af Amer: 56 mL/min — ABNORMAL LOW (ref >60)
Glucose, Bld: 152 mg/dL — ABNORMAL HIGH (ref 65–99)
POTASSIUM: 3.7 mmol/L (ref 3.5–5.1)
Sodium: 141 mmol/L (ref 135–145)

## 2014-11-23 LAB — URINALYSIS, ROUTINE W REFLEX MICROSCOPIC
BILIRUBIN URINE: NEGATIVE
GLUCOSE, UA: NEGATIVE mg/dL
KETONES UR: NEGATIVE mg/dL
Leukocytes, UA: NEGATIVE
Nitrite: NEGATIVE
PH: 6.5 (ref 5.0–8.0)
Protein, ur: 30 mg/dL — AB
Specific Gravity, Urine: 1.025 (ref 1.005–1.030)
Urobilinogen, UA: 1 mg/dL (ref 0.0–1.0)

## 2014-11-23 MED ORDER — ONDANSETRON 4 MG PO TBDP: ORAL_TABLET | ORAL | Status: DC

## 2014-11-23 MED ORDER — KETOROLAC TROMETHAMINE 30 MG/ML IJ SOLN
30.0000 mg | Freq: Once | INTRAMUSCULAR | Status: AC
  Administered 2014-11-23: 30 mg via INTRAVENOUS
  Filled 2014-11-23: qty 1

## 2014-11-23 MED ORDER — HYDROCODONE-ACETAMINOPHEN 5-325 MG PO TABS: 1.0000 | ORAL_TABLET | ORAL | Status: DC | PRN

## 2014-11-23 MED ORDER — HYDROMORPHONE HCL 1 MG/ML IJ SOLN
1.0000 mg | Freq: Once | INTRAMUSCULAR | Status: AC
Start: 2014-11-23 — End: 2014-11-23
  Administered 2014-11-23: 1 mg via INTRAVENOUS
  Filled 2014-11-23: qty 1

## 2014-11-23 MED ORDER — TAMSULOSIN HCL 0.4 MG PO CAPS: 0.4000 mg | ORAL_CAPSULE | Freq: Every day | ORAL | Status: DC

## 2014-11-23 MED ORDER — HYDROMORPHONE HCL 1 MG/ML IJ SOLN
1.0000 mg | Freq: Once | INTRAMUSCULAR | Status: AC
  Administered 2014-11-23: 1 mg via INTRAVENOUS
  Filled 2014-11-23: qty 1

## 2014-11-23 MED ORDER — ONDANSETRON HCL 4 MG/2ML IJ SOLN
4.0000 mg | Freq: Once | INTRAMUSCULAR | Status: AC
  Administered 2014-11-23: 4 mg via INTRAVENOUS
  Filled 2014-11-23: qty 2

## 2014-11-23 MED ORDER — SODIUM CHLORIDE 0.9 % IV BOLUS (SEPSIS)
1000.0000 mL | Freq: Once | INTRAVENOUS | Status: AC
  Administered 2014-11-23: 1000 mL via INTRAVENOUS

## 2014-11-23 NOTE — ED Notes (Signed)
Patient states that around 2pm he started having pain in his lower abdomen. Patient also stated that after he ate he began to vomit.

## 2014-11-23 NOTE — ED Provider Notes (Signed)
CSN: [id number]     Arrival date & time 11/23/14  1612 History   First MD Initiated Contact with Patient 11/23/14 1648     Chief Complaint  Patient presents with  . Flank Pain     (Consider location/radiation/quality/duration/timing/severity/associated sxs/prior Treatment) Patient is a 69 y.o. male presenting with abdominal pain.  Abdominal Pain Pain location:  Suprapubic Pain quality: cramping and sharp   Pain radiates to:  Does not radiate Pain severity:  Moderate Onset quality:  Gradual Duration:  1 day Timing:  Constant Progression:  Unchanged Chronicity:  Recurrent Context comment:  Prior nephrolithiasis, states similar Relieved by:  Nothing Worsened by:  Nothing tried Associated symptoms: anorexia, chills, nausea and vomiting   Associated symptoms: no diarrhea, no dysuria, no fever and no hematuria     Past Medical History  Diagnosis Date  . Hypertension   . Hypercholesteremia   . Tobacco abuse   . RBBB   . Coronary artery disease 04/13/2008    Drug eluting stent in RCA  . Arthritis   . Allergic rhinitis   . Renal disorder    Past Surgical History  Procedure Laterality Date  . Carotid stent  2010  . Shoulder acromioplasty  2010   Family History  Problem Relation Age of Onset  . Stroke Mother   . Stroke Father    Social History  Substance Use Topics  . Smoking status: Current Every Day Smoker -- 0.50 packs/day for 30 years    Start date: 10/27/1982  . Smokeless tobacco: None  . Alcohol Use: No    Review of Systems  Constitutional: Positive for chills. Negative for fever.  Gastrointestinal: Positive for nausea, vomiting, abdominal pain and anorexia. Negative for diarrhea.  Genitourinary: Negative for dysuria and hematuria.  All other systems reviewed and are negative.     Allergies  Bee venom  Home Medications   Prior to Admission medications   Medication Sig Start Date End Date Taking? Authorizing Provider  amLODipine (NORVASC) 5 MG tablet  Take 5 mg by mouth daily.    Historical Provider, MD  aspirin 81 MG EC tablet Take 1 tablet (81 mg total) by mouth daily. 03/11/14   Quintella Reichert, MD  co-enzyme Q-10 30 MG capsule Take 100 mg by mouth daily.    Historical Provider, MD  diphenhydrAMINE (BENADRYL) 25 MG tablet Take 25 mg by mouth every 6 (six) hours as needed (for allergic reaction).    Historical Provider, MD  EPINEPHrine (EPIPEN IJ) Inject as directed as needed (bee stings).     Historical Provider, MD  etodolac (LODINE) 400 MG tablet 400 mg 2 (two) times daily as needed. 09/16/14   Historical Provider, MD  fluticasone (FLONASE) 50 MCG/ACT nasal spray 2 sprays 2 (two) times daily. 10/12/14   Historical Provider, MD  HYDROcodone-acetaminophen (NORCO/VICODIN) 5-325 MG per tablet Take 1 tablet by mouth every 6 (six) hours as needed. Patient not taking: Reported on 10/27/2014 12/01/13   Derwood Kaplan, MD  ibuprofen (ADVIL,MOTRIN) 600 MG tablet Take 1 tablet (600 mg total) by mouth every 6 (six) hours as needed. Patient taking differently: Take 600 mg by mouth every 6 (six) hours as needed (pain).  12/01/13   Derwood Kaplan, MD  ondansetron (ZOFRAN ODT) 8 MG disintegrating tablet Take 1 tablet (8 mg total) by mouth every 8 (eight) hours as needed for nausea. Patient not taking: Reported on 03/11/2014 12/01/13   Derwood Kaplan, MD  simvastatin (ZOCOR) 20 MG tablet TAKE 1 TABLET BY MOUTH DAILY  10/12/14   Quintella Reichert, MD   BP 158/77 mmHg  Pulse 79  Resp 18  SpO2 100% Physical Exam  Constitutional: He is oriented to person, place, and time. He appears well-developed and well-nourished.  HENT:  Head: Normocephalic and atraumatic.  Eyes: Conjunctivae and EOM are normal.  Neck: Normal range of motion. Neck supple.  Cardiovascular: Normal rate, regular rhythm and normal heart sounds.   Pulmonary/Chest: Effort normal and breath sounds normal. No respiratory distress.  Abdominal: He exhibits no distension. There is no tenderness. There is no  rebound and no guarding.  Musculoskeletal: Normal range of motion.  Neurological: He is alert and oriented to person, place, and time.  Skin: Skin is warm and dry.  Vitals reviewed.   ED Course  Procedures (including critical care time) Labs Review Labs Reviewed  BASIC METABOLIC PANEL  CBC  URINALYSIS, ROUTINE W REFLEX MICROSCOPIC (NOT AT [facility])    Imaging Review No results found. I have personally reviewed and evaluated these images and lab results as part of my medical decision-making.   EKG Interpretation None      MDM   Final diagnoses:  None    69 y.o. male with pertinent PMH of nephrolithiasis, htn, cad presents with suprapubic abdominal pain as above. Patient states symptoms are identical to prior nephrolithiasis. On arrival today patient has vital signs and physical exam as above. Workup revealed nephrolithiasis. Patient had relief of pain with Dilaudid and Toradol. Discharged home in stable condition to follow-up with urology..    I have reviewed all laboratory and imaging studies if ordered as above  1. Nephrolithiasis         Mirian Mo, MD 11/25/14 [phone]

## 2014-11-23 NOTE — Discharge Instructions (Signed)

## 2014-11-23 NOTE — ED Notes (Signed)
Per pt, states right flank pain that started around 1100-states he vomited once-diaphorectic

## 2014-12-07 ENCOUNTER — Encounter: Payer: Self-pay | Admitting: Cardiology

## 2014-12-15 DIAGNOSIS — Z23 Encounter for immunization: Secondary | ICD-10-CM | POA: Diagnosis not present

## 2015-02-14 DIAGNOSIS — H2513 Age-related nuclear cataract, bilateral: Secondary | ICD-10-CM | POA: Diagnosis not present

## 2015-03-13 ENCOUNTER — Other Ambulatory Visit: Payer: Self-pay | Admitting: Cardiology

## 2015-03-29 ENCOUNTER — Encounter: Payer: Self-pay | Admitting: Cardiology

## 2015-03-29 ENCOUNTER — Ambulatory Visit (INDEPENDENT_AMBULATORY_CARE_PROVIDER_SITE_OTHER): Payer: Medicare Other | Admitting: Cardiology

## 2015-03-29 ENCOUNTER — Other Ambulatory Visit: Payer: Medicare Other

## 2015-03-29 VITALS — BP 120/70 | HR 77 | Ht 69.0 in | Wt 174.0 lb

## 2015-03-29 DIAGNOSIS — Z720 Tobacco use: Secondary | ICD-10-CM | POA: Diagnosis not present

## 2015-03-29 DIAGNOSIS — R0989 Other specified symptoms and signs involving the circulatory and respiratory systems: Secondary | ICD-10-CM

## 2015-03-29 DIAGNOSIS — I2510 Atherosclerotic heart disease of native coronary artery without angina pectoris: Secondary | ICD-10-CM | POA: Diagnosis not present

## 2015-03-29 DIAGNOSIS — I10 Essential (primary) hypertension: Secondary | ICD-10-CM | POA: Diagnosis not present

## 2015-03-29 DIAGNOSIS — E7800 Pure hypercholesterolemia, unspecified: Secondary | ICD-10-CM | POA: Diagnosis not present

## 2015-03-29 DIAGNOSIS — F17210 Nicotine dependence, cigarettes, uncomplicated: Secondary | ICD-10-CM

## 2015-03-29 LAB — LIPID PANEL
CHOL/HDL RATIO: 2.6 ratio (ref <=5.0)
Cholesterol: 123 mg/dL — ABNORMAL LOW (ref 125–200)
HDL: 48 mg/dL (ref >=40)
LDL Cholesterol: 58 mg/dL (ref <130)
Triglycerides: 84 mg/dL (ref <150)
VLDL: 17 mg/dL (ref <30)

## 2015-03-29 LAB — HEPATIC FUNCTION PANEL
ALK PHOS: 101 U/L (ref 40–115)
ALT: 23 U/L (ref 9–46)
AST: 27 U/L (ref 10–35)
Albumin: 4.3 g/dL (ref 3.6–5.1)
BILIRUBIN TOTAL: 0.7 mg/dL (ref 0.2–1.2)
Bilirubin, Direct: 0.1 mg/dL (ref <=0.2)
Indirect Bilirubin: 0.6 mg/dL (ref 0.2–1.2)
TOTAL PROTEIN: 7.3 g/dL (ref 6.1–8.1)

## 2015-03-29 MED ORDER — AMLODIPINE BESYLATE 5 MG PO TABS: 5.0000 mg | ORAL_TABLET | Freq: Every day | ORAL | Status: DC

## 2015-03-29 MED ORDER — SIMVASTATIN 20 MG PO TABS: 20.0000 mg | ORAL_TABLET | Freq: Every day | ORAL | Status: DC

## 2015-03-29 NOTE — Progress Notes (Signed)
Cardiology Office Note   Date:  03/29/2015   ID:  Julian Weaver, DOB 01-26-1946, MRN [id number]  PCP:  Hollice EspyGATES,DONNA RUTH, MD    No chief complaint on file.     History of Present Illness: Julian Weaver is a 70y.o. 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 He is doing well. He denies any canginal hest pain, SOB, DOE, LE edema or syncope. He is still smoking.  He denies claudication.  Past Medical History  Diagnosis Date  . Hypertension   . Hypercholesteremia   . Tobacco abuse   . RBBB   . Coronary artery disease 04/13/2008    Drug eluting stent in RCA  . Arthritis   . Allergic rhinitis   . Renal disorder     Past Surgical History  Procedure Laterality Date  . Carotid stent  2010  . Shoulder acromioplasty  2010     Current Outpatient Prescriptions  Medication Sig Dispense Refill  . amLODipine (NORVASC) 5 MG tablet Take 5 mg by mouth daily.    [name]. aspirin 81 MG EC tablet Take 1 tablet (81 mg total) by mouth daily.    [name]. co-enzyme Q-10 30 MG capsule Take 100 mg by mouth daily.    . diphenhydrAMINE (BENADRYL) 25 MG tablet Take 25 mg by mouth every 6 (six) hours as needed (for allergic reaction).    [name]. EPINEPHrine (EPIPEN IJ) Inject as directed as needed (bee stings).     . fluticasone (FLONASE) 50 MCG/ACT nasal spray 2 sprays 2 (two) times daily as needed for allergies.   11  . HYDROcodone-acetaminophen (NORCO/VICODIN) 5-325 MG per tablet Take 1-2 tablets by mouth every 4 (four) hours as needed. 20 tablet 0  . ondansetron (ZOFRAN ODT) 4 MG disintegrating tablet 4mg  ODT q4 hours prn nausea/vomit 15 tablet 0  . simvastatin (ZOCOR) 20 MG tablet TAKE 1 TABLET BY MOUTH DAILY 90 tablet 0  . tamsulosin (FLOMAX) 0.4 MG CAPS capsule Take 1 capsule (0.4 mg total) by mouth daily. 30 capsule 0   No current facility-administered medications for this visit.    Allergies:   Bee venom    Social History:  The  patient  reports that he has been smoking.  He started smoking about 32 years ago. He does not have any smokeless tobacco history on file. He reports that he does not drink alcohol or use illicit drugs.   Family History:  The patient's family history includes Stroke in his father and mother.    ROS:  Please see the history of present illness.   Otherwise, review of systems are positive for none.   All other systems are reviewed and negative.    PHYSICAL EXAM: VS:  BP 120/70 mmHg  Pulse 77  Ht 5\' 9"  (1.753 m)  Wt 174 lb (78.926 kg)  BMI 25.68 kg/m2 , BMI Body mass index is 25.68 kg/(m^2). GEN: Well nourished, well developed, in no acute distress HEENT: normal Neck: no JVD or masses.  Bilateral carotid bruits Cardiac: RRR; no murmurs, rubs, or gallops,no edema  Respiratory:  clear to auscultation bilaterally, normal work of breathing GI: soft, nontender, nondistended, + BS MS: no deformity or atrophy Skin: warm and dry, no rash Neuro:  Strength and sensation are intact Psych: euthymic mood, full affect   EKG:  EKG is ordered today. The ekg ordered today  demonstrates NSR with RBBB   Recent Labs: 11/23/2014: BUN 19; Creatinine, Ser 1.28*; Hemoglobin 15.5; Platelets 217; Potassium 3.7; Sodium 141    Lipid Panel    Component Value Date/Time   CHOL 106 03/11/2014 0906   TRIG 63.0 03/11/2014 0906   HDL 40.60 03/11/2014 0906   CHOLHDL 3 03/11/2014 0906   VLDL 12.6 03/11/2014 0906   LDLCALC 53 03/11/2014 0906      Wt Readings from Last 3 Encounters:  03/29/15 174 lb (78.926 kg)  10/27/14 176 lb (79.833 kg)  03/11/14 174 lb 9.6 oz (79.198 kg)    ASSESSMENT AND PLAN:  1. HTN - well controlled - continue amlodipine 2. Dyslipidemia -recheck fasting lipids and ALT - continue simvastatin 3. ASCAD - continue ASA/statin 4. Tobacco abuse - he continue to smoke. I again counseled him on the need to stop.He is going to stop and is going to  take wellbutrin that Dr. Kevan Ny prescribed.   5.  Chronic RBBB 6.  Bilateral carotid bruits - check carotid dopplers     Current medicines are reviewed at length with the patient today.  The patient does not have concerns regarding medicines.  The following changes have been made:  no change  Labs/ tests ordered today: See above Assessment and Plan  Orders Placed This Encounter  Procedures  . EKG 12-Lead     Disposition:   FU with me in 1 year  Signed, Quintella Reichert, MD  03/29/2015 8:49 AM    [facility] [address] [place], [place], [place]  [address] Phone: [phone]; Fax: [phone]

## 2015-03-29 NOTE — Patient Instructions (Signed)
Medication Instructions:  Your physician recommends that you continue on your current medications as directed. Please refer to the Current Medication list given to you today.   Labwork: TODAY: LFTs, Lipids  Testing/Procedures: Your physician has requested that you have a carotid duplex. This test is an ultrasound of the carotid arteries in your neck. It looks at blood flow through these arteries that supply the brain with blood. Allow one hour for this exam. There are no restrictions or special instructions.  Follow-Up: Your physician wants you to follow-up in: 1 year with Dr. Mayford Knifeurner. You will receive a reminder letter in the mail two months in advance. If you don't receive a letter, please call our office to schedule the follow-up appointment.   Any Other Special Instructions Will Be Listed Below (If Applicable).     If you need a refill on your cardiac medications before your next appointment, please call your pharmacy.

## 2015-03-30 ENCOUNTER — Telehealth: Payer: Self-pay | Admitting: Cardiology

## 2015-03-30 NOTE — Telephone Encounter (Signed)
Informed patient of results and verbal understanding expressed.  

## 2015-03-30 NOTE — Telephone Encounter (Signed)
-----   Message from Quintella Reichertraci R Turner, MD sent at 03/29/2015  9:57 PM EST ----- Lipids at goal continue current therapy

## 2015-03-30 NOTE — Telephone Encounter (Signed)
New message  ° ° °Patient calling for test results.   °

## 2015-04-03 ENCOUNTER — Telehealth: Payer: Self-pay

## 2015-04-03 DIAGNOSIS — E7800 Pure hypercholesterolemia, unspecified: Secondary | ICD-10-CM

## 2015-04-03 MED ORDER — ATORVASTATIN CALCIUM 20 MG PO TABS: 20.0000 mg | ORAL_TABLET | Freq: Every day | ORAL | Status: DC

## 2015-04-03 NOTE — Telephone Encounter (Signed)
Per Dr. Mayford Knifeurner, Message:     Patient is on amlodipine so should not be on simvastatin. Please change simvastatin to Lipitor 20mg  daily and recheck FLP and ALT in 6 weeks           Instructed patient to STOP SIMVASTATIN and START LIPITOR 20 mg daily. FLP and ALT scheduled for March 1. Patient agrees with treatment plan.

## 2015-04-04 ENCOUNTER — Telehealth: Payer: Self-pay

## 2015-04-04 ENCOUNTER — Ambulatory Visit (HOSPITAL_COMMUNITY)
Admission: RE | Admit: 2015-04-04 | Discharge: 2015-04-04 | Disposition: A | Discharge status: Home / Self Care | Payer: Medicare Other | Source: Ambulatory Visit | Attending: Cardiology | Admitting: Cardiology

## 2015-04-04 DIAGNOSIS — E7800 Pure hypercholesterolemia, unspecified: Secondary | ICD-10-CM | POA: Insufficient documentation

## 2015-04-04 DIAGNOSIS — R0989 Other specified symptoms and signs involving the circulatory and respiratory systems: Secondary | ICD-10-CM | POA: Insufficient documentation

## 2015-04-04 DIAGNOSIS — I6523 Occlusion and stenosis of bilateral carotid arteries: Secondary | ICD-10-CM | POA: Insufficient documentation

## 2015-04-04 NOTE — Telephone Encounter (Signed)
-----   Message from Quintella Reichertraci R Turner, MD sent at 04/04/2015  3:23 PM EST ----- 1-39% bilateral carotid artery stenosis - repeat study in 2 years

## 2015-04-04 NOTE — Telephone Encounter (Signed)
Informed patient of results and verbal understanding expressed.   Repeat carotids ordered to be scheduled in 2 years. Patient agrees with treatment plan. 

## 2015-05-11 ENCOUNTER — Other Ambulatory Visit: Payer: Self-pay | Admitting: Cardiology

## 2015-05-24 ENCOUNTER — Other Ambulatory Visit (INDEPENDENT_AMBULATORY_CARE_PROVIDER_SITE_OTHER): Payer: Medicare Other | Admitting: *Deleted

## 2015-05-24 DIAGNOSIS — E7800 Pure hypercholesterolemia, unspecified: Secondary | ICD-10-CM

## 2015-05-24 LAB — LIPID PANEL
Cholesterol: 103 mg/dL — ABNORMAL LOW (ref 125–200)
HDL: 41 mg/dL (ref >=40)
LDL CALC: 46 mg/dL (ref <130)
TRIGLYCERIDES: 78 mg/dL (ref <150)
Total CHOL/HDL Ratio: 2.5 Ratio (ref <=5.0)
VLDL: 16 mg/dL (ref <30)

## 2015-05-24 LAB — ALT: ALT: 21 U/L (ref 9–46)

## 2015-05-24 NOTE — Addendum Note (Signed)
Addended by: Tonita Phoenix on: 05/24/2015 08:16 AM   Modules accepted: Orders

## 2015-07-26 DIAGNOSIS — L989 Disorder of the skin and subcutaneous tissue, unspecified: Secondary | ICD-10-CM | POA: Diagnosis not present

## 2015-07-26 DIAGNOSIS — M1991 Primary osteoarthritis, unspecified site: Secondary | ICD-10-CM | POA: Diagnosis not present

## 2015-08-01 DIAGNOSIS — L820 Inflamed seborrheic keratosis: Secondary | ICD-10-CM | POA: Diagnosis not present

## 2015-08-22 ENCOUNTER — Other Ambulatory Visit: Payer: Self-pay | Admitting: Surgery

## 2015-08-22 DIAGNOSIS — M542 Cervicalgia: Secondary | ICD-10-CM

## 2015-08-22 DIAGNOSIS — M47812 Spondylosis without myelopathy or radiculopathy, cervical region: Secondary | ICD-10-CM | POA: Diagnosis not present

## 2015-08-29 ENCOUNTER — Other Ambulatory Visit: Payer: Self-pay | Admitting: Surgery

## 2015-08-29 ENCOUNTER — Ambulatory Visit
Admission: RE | Admit: 2015-08-29 | Discharge: 2015-08-29 | Disposition: A | Discharge status: Home / Self Care | Payer: Medicare Other | Source: Ambulatory Visit | Attending: Surgery | Admitting: Surgery

## 2015-08-29 DIAGNOSIS — Z01818 Encounter for other preprocedural examination: Secondary | ICD-10-CM | POA: Diagnosis not present

## 2015-08-29 DIAGNOSIS — Z77018 Contact with and (suspected) exposure to other hazardous metals: Secondary | ICD-10-CM

## 2015-08-29 DIAGNOSIS — M50321 Other cervical disc degeneration at C4-C5 level: Secondary | ICD-10-CM | POA: Diagnosis not present

## 2015-08-29 DIAGNOSIS — M50322 Other cervical disc degeneration at C5-C6 level: Secondary | ICD-10-CM | POA: Diagnosis not present

## 2015-08-29 DIAGNOSIS — M542 Cervicalgia: Secondary | ICD-10-CM

## 2015-09-22 DIAGNOSIS — M47812 Spondylosis without myelopathy or radiculopathy, cervical region: Secondary | ICD-10-CM | POA: Diagnosis not present

## 2015-10-05 DIAGNOSIS — E7800 Pure hypercholesterolemia, unspecified: Secondary | ICD-10-CM | POA: Diagnosis not present

## 2015-10-05 DIAGNOSIS — J449 Chronic obstructive pulmonary disease, unspecified: Secondary | ICD-10-CM | POA: Diagnosis not present

## 2015-10-05 DIAGNOSIS — Z0000 Encounter for general adult medical examination without abnormal findings: Secondary | ICD-10-CM | POA: Diagnosis not present

## 2015-10-05 DIAGNOSIS — R110 Nausea: Secondary | ICD-10-CM | POA: Diagnosis not present

## 2015-10-05 DIAGNOSIS — Z125 Encounter for screening for malignant neoplasm of prostate: Secondary | ICD-10-CM | POA: Diagnosis not present

## 2015-10-05 DIAGNOSIS — I10 Essential (primary) hypertension: Secondary | ICD-10-CM | POA: Diagnosis not present

## 2015-10-05 DIAGNOSIS — I700 Atherosclerosis of aorta: Secondary | ICD-10-CM | POA: Diagnosis not present

## 2015-10-05 DIAGNOSIS — F17200 Nicotine dependence, unspecified, uncomplicated: Secondary | ICD-10-CM | POA: Diagnosis not present

## 2015-10-05 DIAGNOSIS — I2510 Atherosclerotic heart disease of native coronary artery without angina pectoris: Secondary | ICD-10-CM | POA: Diagnosis not present

## 2015-10-05 DIAGNOSIS — M79671 Pain in right foot: Secondary | ICD-10-CM | POA: Diagnosis not present

## 2015-11-01 DIAGNOSIS — M722 Plantar fascial fibromatosis: Secondary | ICD-10-CM | POA: Diagnosis not present

## 2015-11-01 DIAGNOSIS — M2011 Hallux valgus (acquired), right foot: Secondary | ICD-10-CM | POA: Diagnosis not present

## 2015-12-05 ENCOUNTER — Ambulatory Visit (INDEPENDENT_AMBULATORY_CARE_PROVIDER_SITE_OTHER): Payer: Medicare Other | Admitting: Podiatry

## 2015-12-05 ENCOUNTER — Ambulatory Visit (INDEPENDENT_AMBULATORY_CARE_PROVIDER_SITE_OTHER): Payer: Medicare Other

## 2015-12-05 VITALS — BP 165/82 | HR 77 | Resp 16 | Ht 69.0 in | Wt 170.0 lb

## 2015-12-05 DIAGNOSIS — M722 Plantar fascial fibromatosis: Secondary | ICD-10-CM

## 2015-12-05 DIAGNOSIS — M2040 Other hammer toe(s) (acquired), unspecified foot: Secondary | ICD-10-CM

## 2015-12-05 DIAGNOSIS — I6523 Occlusion and stenosis of bilateral carotid arteries: Secondary | ICD-10-CM

## 2015-12-05 DIAGNOSIS — M2011 Hallux valgus (acquired), right foot: Secondary | ICD-10-CM | POA: Diagnosis not present

## 2015-12-05 NOTE — Patient Instructions (Addendum)
Pre-Operative Instructions  Congratulations, you have decided to take an important step to improving your quality of life.  You can be assured that the doctors of [facility] will be with you every step of the way.  1. Plan to be at the surgery center/hospital at least 1 (one) hour prior to your scheduled time unless otherwise directed by the surgical center/hospital staff.  You must have a responsible adult accompany you, remain during the surgery and drive you home.  Make sure you have directions to the surgical center/hospital and know how to get there on time. 2. For hospital based surgery you will need to obtain a history and physical form from your family physician within 1 month prior to the date of surgery- we will give you a form for you primary physician.  3. We make every effort to accommodate the date you request for surgery.  There are however, times where surgery dates or times have to be moved.  We will contact you as soon as possible if a change in schedule is required.   4. No Aspirin/Ibuprofen for one week before surgery.  If you are on aspirin, any non-steroidal anti-inflammatory medications (Mobic, Aleve, Ibuprofen) you should stop taking it 7 days prior to your surgery.  You make take Tylenol  For pain prior to surgery.  5. Medications- If you are taking daily heart and blood pressure medications, seizure, reflux, allergy, asthma, anxiety, pain or diabetes medications, make sure the surgery center/hospital is aware before the day of surgery so they may notify you which medications to take or avoid the day of surgery. 6. No food or drink after midnight the night before surgery unless directed otherwise by surgical center/hospital staff. 7. No alcoholic beverages 24 hours prior to surgery.  No smoking 24 hours prior to or 24 hours after surgery. 8. Wear loose pants or shorts- loose enough to fit over bandages, boots, and casts. 9. No slip on shoes, sneakers are best. 10. Bring  your boot with you to the surgery center/hospital.  Also bring crutches or a walker if your physician has prescribed it for you.  If you do not have this equipment, it will be provided for you after surgery. 11. If you have not been contracted by the surgery center/hospital by the day before your surgery, call to confirm the date and time of your surgery. 12. Leave-time from work may vary depending on the type of surgery you have.  Appropriate arrangements should be made prior to surgery with your employer. 13. Prescriptions will be provided immediately following surgery by your doctor.  Have these filled as soon as possible after surgery and take the medication as directed. 14. Remove nail polish on the operative foot. 15. Wash the night before surgery.  The night before surgery wash the foot and leg well with the antibacterial soap provided and water paying special attention to beneath the toenails and in between the toes.  Rinse thoroughly with water and dry well with a towel.  Perform this wash unless told not to do so by your physician.  Enclosed: 1 Ice pack (please put in freezer the night before surgery)   1 Hibiclens skin cleaner   Pre-op Instructions  If you have any questions regarding the instructions, do not hesitate to call our office.  [place]: [address] [place], [place] [address] [phone]  [place]: [address] [place], [place] [address] [phone]  [place]: [address]  [place], [place] [address] [phone]   Dr.   Cristie HemNorman Regal DPM, Dr. Ovid CurdMatthew Wagoner DPM, Dr. Ardelle AntonM. Todd Hyatt DPM, Dr. Asencion Islamitorya Stover DPM   Plantar Fasciitis (Heel Spur Syndrome) with Rehab The plantar fascia is a fibrous, ligament-like, soft-tissue structure that spans the bottom of the foot. Plantar fasciitis is a condition that causes pain in the foot due to inflammation of the tissue. SYMPTOMS   Pain and tenderness on the underneath side of the foot.  Pain that worsens with standing or  walking. CAUSES  Plantar fasciitis is caused by irritation and injury to the plantar fascia on the underneath side of the foot. Common mechanisms of injury include:  Direct trauma to bottom of the foot.  Damage to a small nerve that runs under the foot where the main fascia attaches to the heel bone.  Stress placed on the plantar fascia due to bone spurs. RISK INCREASES WITH:   Activities that place stress on the plantar fascia (running, jumping, pivoting, or cutting).  Poor strength and flexibility.  Improperly fitted shoes.  Tight calf muscles.  Flat feet.  Failure to warm-up properly before activity.  Obesity. PREVENTION  Warm up and stretch properly before activity.  Allow for adequate recovery between workouts.  Maintain physical fitness:  Strength, flexibility, and endurance.  Cardiovascular fitness.  Maintain a health body weight.  Avoid stress on the plantar fascia.  Wear properly fitted shoes, including arch supports for individuals who have flat feet.  PROGNOSIS  If treated properly, then the symptoms of plantar fasciitis usually resolve without surgery. However, occasionally surgery is necessary.  RELATED COMPLICATIONS   Recurrent symptoms that may result in a chronic condition.  Problems of the lower back that are caused by compensating for the injury, such as limping.  Pain or weakness of the foot during push-off following surgery.  Chronic inflammation, scarring, and partial or complete fascia tear, occurring more often from repeated injections.  TREATMENT  Treatment initially involves the use of ice and medication to help reduce pain and inflammation. The use of strengthening and stretching exercises may help reduce pain with activity, especially stretches of the Achilles tendon. These exercises may be performed at home or with a therapist. Your caregiver may recommend that you use heel cups of arch supports to help reduce stress on the plantar  fascia. Occasionally, corticosteroid injections are given to reduce inflammation. If symptoms persist for greater than 6 months despite non-surgical (conservative), then surgery may be recommended.   MEDICATION   If pain medication is necessary, then nonsteroidal anti-inflammatory medications, such as aspirin and ibuprofen, or other minor pain relievers, such as acetaminophen, are often recommended.  Do not take pain medication within 7 days before surgery.  Prescription pain relievers may be given if deemed necessary by your caregiver. Use only as directed and only as much as you need.  Corticosteroid injections may be given by your caregiver. These injections should be reserved for the most serious cases, because they may only be given a certain number of times.  HEAT AND COLD  Cold treatment (icing) relieves pain and reduces inflammation. Cold treatment should be applied for 10 to 15 minutes every 2 to 3 hours for inflammation and pain and immediately after any activity that aggravates your symptoms. Use ice packs or massage the area with a piece of ice (ice massage).  Heat treatment may be used prior to performing the stretching and strengthening activities prescribed by your caregiver, physical therapist, or athletic trainer. Use a heat pack or soak the injury in warm water.  SEEK IMMEDIATE MEDICAL CARE IF:  Treatment seems to offer no benefit, or the condition worsens.  Any medications produce adverse side effects.  EXERCISES- RANGE OF MOTION (ROM) AND STRETCHING EXERCISES - Plantar Fasciitis (Heel Spur Syndrome) These exercises may help you when beginning to rehabilitate your injury. Your symptoms may resolve with or without further involvement from your physician, physical therapist or athletic trainer. While completing these exercises, remember:   Restoring tissue flexibility helps normal motion to return to the joints. This allows healthier, less painful movement and  activity.  An effective stretch should be held for at least 30 seconds.  A stretch should never be painful. You should only feel a gentle lengthening or release in the stretched tissue.  RANGE OF MOTION - Toe Extension, Flexion  Sit with your right / left leg crossed over your opposite knee.  Grasp your toes and gently pull them back toward the top of your foot. You should feel a stretch on the bottom of your toes and/or foot.  Hold this stretch for 10 seconds.  Now, gently pull your toes toward the bottom of your foot. You should feel a stretch on the top of your toes and or foot.  Hold this stretch for 10 seconds. Repeat  times. Complete this stretch 3 times per day.   RANGE OF MOTION - Ankle Dorsiflexion, Active Assisted  Remove shoes and sit on a chair that is preferably not on a carpeted surface.  Place right / left foot under knee. Extend your opposite leg for support.  Keeping your heel down, slide your right / left foot back toward the chair until you feel a stretch at your ankle or calf. If you do not feel a stretch, slide your bottom forward to the edge of the chair, while still keeping your heel down.  Hold this stretch for 10 seconds. Repeat 3 times. Complete this stretch 2 times per day.   STRETCH  Gastroc, Standing  Place hands on wall.  Extend right / left leg, keeping the front knee somewhat bent.  Slightly point your toes inward on your back foot.  Keeping your right / left heel on the floor and your knee straight, shift your weight toward the wall, not allowing your back to arch.  You should feel a gentle stretch in the right / left calf. Hold this position for 10 seconds. Repeat 3 times. Complete this stretch 2 times per day.  STRETCH  Soleus, Standing  Place hands on wall.  Extend right / left leg, keeping the other knee somewhat bent.  Slightly point your toes inward on your back foot.  Keep your right / left heel on the floor, bend your back  knee, and slightly shift your weight over the back leg so that you feel a gentle stretch deep in your back calf.  Hold this position for 10 seconds. Repeat 3 times. Complete this stretch 2 times per day.  STRETCH  Gastrocsoleus, Standing  Note: This exercise can place a lot of stress on your foot and ankle. Please complete this exercise only if specifically instructed by your caregiver.   Place the ball of your right / left foot on a step, keeping your other foot firmly on the same step.  Hold on to the wall or a rail for balance.  Slowly lift your other foot, allowing your body weight to press your heel down over the edge of the step.  You should feel a stretch in your right / left calf.  Hold this position for 10 seconds.  Repeat this exercise with a slight bend in your right / left knee. Repeat 3 times. Complete this stretch 2 times per day.   STRENGTHENING EXERCISES - Plantar Fasciitis (Heel Spur Syndrome)  These exercises may help you when beginning to rehabilitate your injury. They may resolve your symptoms with or without further involvement from your physician, physical therapist or athletic trainer. While completing these exercises, remember:   Muscles can gain both the endurance and the strength needed for everyday activities through controlled exercises.  Complete these exercises as instructed by your physician, physical therapist or athletic trainer. Progress the resistance and repetitions only as guided.  STRENGTH - Towel Curls  Sit in a chair positioned on a non-carpeted surface.  Place your foot on a towel, keeping your heel on the floor.  Pull the towel toward your heel by only curling your toes. Keep your heel on the floor. Repeat 3 times. Complete this exercise 2 times per day.  STRENGTH - Ankle Inversion  Secure one end of a rubber exercise band/tubing to a fixed object (table, pole). Loop the other end around your foot just before your toes.  Place your  fists between your knees. This will focus your strengthening at your ankle.  Slowly, pull your big toe up and in, making sure the band/tubing is positioned to resist the entire motion.  Hold this position for 10 seconds.  Have your muscles resist the band/tubing as it slowly pulls your foot back to the starting position. Repeat 3 times. Complete this exercises 2 times per day.  Document Released: 03/11/2005 Document Revised: 06/03/2011 Document Reviewed: 06/23/2008 [facility] Patient Information 2014 [place], [place].

## 2015-12-05 NOTE — Progress Notes (Signed)
Subjective:    Patient ID: Julian Weaver, male    DOB: 08-28-1945, 70 y.o.   MRN: [id number]  HPI: He presents today with a chief complaint of a painful right foot. He states that his heel has been hurting for the past 2 weeks with excruciating pain. He states that as if this were not enough he has a severe bunion which hurts he feels like he is walking on a golf ball and has a crossover toe second digit right foot. He states that all of this makes it nearly impossible to walk much less find shoes. It is affecting my ability to perform daily activities states. Excruciatingly painful he is looking for surgical relief.    Review of Systems  Musculoskeletal: Positive for myalgias.  All other systems reviewed and are negative.      Objective:   Physical Exam: Vital signs are stable he is alert and oriented 3 no apparent distress in good humor. Pulses are strongly palpable neurologic sensorium is intact deep tendon reflexes are intact muscle strength +5 over 5 dorsiflexion plantar flexors and inverters everters all into the musculature is intact orthopedic evaluation demonstrates severe hallux valgus deformity with overlapping completely dislocated second digit of the right foot rigid flexor contractions at the PIPJ and DIPJ and proximally a 45 varus rotation of the toe on the second metatarsal head. He has moderate to severe pain on palpation of the medial calcaneal tubercle of the right heel. Considerable pain on attempted motion of the first metatarsophalangeal joint. Radiographs 3 views right foot taken in the office today do demonstrate soft tissue increase in density of the plantar fascia calcaneal insertion site of the right heel. A very large increase in the first intermetatarsal angle greater than 15 and hallux adductus angle greater than 15 indicating dislocation. Elongated second metatarsal with severe hammertoe deformity overlapping the hallux rigid in nature. No open lesions or  wounds are noted on the skin.        Assessment & Plan:  Assessment: Plantar fasciitis with severe hallux valgus deformity and a painful hammertoe second digit right foot.  Plan: We discussed the etiology pathology conservative versus surgical therapies. At this point I recommended an injection to the right heel Kenalog with local anesthetic and a plantar fascia brace. I also recommended surgical intervention for the right foot consisting of a first metatarsal osteotomy with screw fixation and an amputation of the second digit due to the contraction of the skin and the complete dislocation of the joint. We did discuss the possibility of a Lapidus procedure and the fusion of the first metatarsophalangeal joint and a possible shortening of the second metatarsal with hammertoe repair which would leave the second ray shorter than that of the first and third. He states that he rather have the toe removed and the golf ball removed from his foot. He understands that the valgus deformity was still be present because we're not fusing the first metatarsophalangeal joint he understands that and is amenable to it. We consented him today for a worse metatarsal osteotomy with double screw fixation amputation disarticulation of the second metatarsal phalangeal joint and an endoscopic plantar fasciotomy all on the right foot. I answered questions to the best of my ability in layman's terms. He understands the possible postop complications which may include but are not limited to postop pain bleeding swelling infection recurrence and need for further surgery. I will follow-up with him in the near future for surgery. And we dispensed a  cam walker today.

## 2015-12-12 ENCOUNTER — Telehealth: Payer: Self-pay | Admitting: *Deleted

## 2015-12-12 NOTE — Telephone Encounter (Signed)
"  I am Julian Weaver's wife.  He is supposed to have surgery on the 13th of October.  I filled out the information last week and got the confirmation and all.  We were just going to check and make sure we are a go for the surgery.  Give me a call at home.  Thank you."

## 2015-12-12 NOTE — Telephone Encounter (Signed)
"  Julian Weaver wants to know if he is scheduled for surgery on October 13."  Yes, he is scheduled.  Does he need to get a physical done before surgery?"  No , he does not need a physical.  "Have you received medical clearance from his Cardiologist?"  No, we have not received medical clearance but we will take care of it.  "Okay, thank you so much."

## 2015-12-23 DIAGNOSIS — Z23 Encounter for immunization: Secondary | ICD-10-CM | POA: Diagnosis not present

## 2015-12-27 ENCOUNTER — Encounter: Payer: Self-pay | Admitting: *Deleted

## 2015-12-28 NOTE — Progress Notes (Signed)
He saw this patient in January 2017. Dr. Ernestene KielMax Hyatt is requesting clearance for podiatric surgery.

## 2015-12-29 ENCOUNTER — Telehealth: Payer: Self-pay

## 2015-12-29 NOTE — Telephone Encounter (Signed)
Cardiac clearance request received from Dr. Jake SeatsMax T. [place], [place] stating:  "We have a mutual patient, Julian Weaver DOB 02/03/1946. I am treating him for Hallux Abducto Valgus, Plantar Fasciitis and Hammer Toe 2nd digit right foot. [facility]. I will be performing an Serafina Royals[place] Bunionectomy, Endoscopic Plantar Fasciotomy and an Amputation Toe MPJ Joint 2nd right toe under a local/IV sedation. I understand the patient sees you for Cardiac Care. Please advise if patient is medically cleared to have these procedures and fax to [phone]."  To Dr. Mayford Knifeurner.

## 2015-12-31 NOTE — Progress Notes (Signed)
Patient is stable from a cardiac standpoint for surgery and ok to hold ASA if needed for procedure

## 2016-01-02 NOTE — Telephone Encounter (Signed)
I already forwarded him a note in a message he sent - please make sure he got it

## 2016-01-03 NOTE — Telephone Encounter (Signed)
Did you get this clearance?

## 2016-01-03 NOTE — Telephone Encounter (Signed)
[facility] and Dr. Al CorpusHyatt to ensure clearance was received from Dr. Mayford Knifeurner.

## 2016-01-04 ENCOUNTER — Other Ambulatory Visit: Payer: Self-pay | Admitting: Podiatry

## 2016-01-04 MED ORDER — CEPHALEXIN 500 MG PO CAPS: 500.0000 mg | ORAL_CAPSULE | Freq: Three times a day (TID) | ORAL | 0 refills | Status: DC

## 2016-01-04 MED ORDER — PROMETHAZINE HCL 25 MG PO TABS: 25.0000 mg | ORAL_TABLET | Freq: Three times a day (TID) | ORAL | 0 refills | Status: DC | PRN

## 2016-01-04 MED ORDER — OXYCODONE-ACETAMINOPHEN 10-325 MG PO TABS: 1.0000 | ORAL_TABLET | Freq: Four times a day (QID) | ORAL | 0 refills | Status: DC | PRN

## 2016-01-04 NOTE — Telephone Encounter (Signed)
Routed clearance via In-basket and fax.

## 2016-01-04 NOTE — Telephone Encounter (Signed)
We have not received medical clearance.  Please send again.  Our fax number is [phone].

## 2016-01-04 NOTE — Progress Notes (Signed)
ok 

## 2016-01-04 NOTE — Telephone Encounter (Signed)
We have not received medical clearance.  Please send again.  Our fax number is [phone].  Thank you.

## 2016-01-05 ENCOUNTER — Encounter: Payer: Self-pay | Admitting: Podiatry

## 2016-01-05 ENCOUNTER — Telehealth: Payer: Self-pay | Admitting: *Deleted

## 2016-01-05 DIAGNOSIS — M2011 Hallux valgus (acquired), right foot: Secondary | ICD-10-CM | POA: Diagnosis not present

## 2016-01-05 DIAGNOSIS — E7800 Pure hypercholesterolemia, unspecified: Secondary | ICD-10-CM | POA: Diagnosis not present

## 2016-01-05 DIAGNOSIS — M205X1 Other deformities of toe(s) (acquired), right foot: Secondary | ICD-10-CM | POA: Diagnosis not present

## 2016-01-05 DIAGNOSIS — M21611 Bunion of right foot: Secondary | ICD-10-CM | POA: Diagnosis not present

## 2016-01-05 DIAGNOSIS — M722 Plantar fascial fibromatosis: Secondary | ICD-10-CM | POA: Diagnosis not present

## 2016-01-05 DIAGNOSIS — M86671 Other chronic osteomyelitis, right ankle and foot: Secondary | ICD-10-CM | POA: Diagnosis not present

## 2016-01-05 NOTE — Telephone Encounter (Signed)
Left message informing pt, promethazine was [facility], informed pt if he would give me the name of a [facility] near him, I would call the rx in and he could pick it up today from their $4.00 formulary.

## 2016-01-11 ENCOUNTER — Ambulatory Visit (INDEPENDENT_AMBULATORY_CARE_PROVIDER_SITE_OTHER): Payer: Medicare Other

## 2016-01-11 ENCOUNTER — Ambulatory Visit (INDEPENDENT_AMBULATORY_CARE_PROVIDER_SITE_OTHER): Payer: Medicare Other | Admitting: Podiatry

## 2016-01-11 VITALS — BP 156/91 | HR 71 | Temp 98.7°F

## 2016-01-11 DIAGNOSIS — M2040 Other hammer toe(s) (acquired), unspecified foot: Secondary | ICD-10-CM

## 2016-01-11 DIAGNOSIS — Z9889 Other specified postprocedural states: Secondary | ICD-10-CM

## 2016-01-11 DIAGNOSIS — M722 Plantar fascial fibromatosis: Secondary | ICD-10-CM

## 2016-01-11 DIAGNOSIS — M2011 Hallux valgus (acquired), right foot: Secondary | ICD-10-CM

## 2016-01-11 NOTE — Progress Notes (Signed)
DOS 10.13.2017 Serafina Royals[place] Bunionectomy with Internal Screw Fixation Right; Endoscopic Plantar Fasciotomy Right; Disarticulation 2nd toe at MPJ Right

## 2016-01-13 NOTE — Progress Notes (Signed)
Subjective: Patient presents one week postop bunionectomy of the right foot with second digit amputation right foot. Patient states he is feeling well.  Objective: Incision site well coapted with sutures intact. No drainage. Moderate ecchymosis and edema noted localized to the right foot.  Assessment: Status post bunionectomy with second digit amputation right foot  Plan of care: Patient is to continue his current weightbearing status, keep dressings clean dry and intact, and follow up in 1 week

## 2016-01-18 ENCOUNTER — Encounter: Payer: Self-pay | Admitting: Podiatry

## 2016-01-18 ENCOUNTER — Ambulatory Visit (INDEPENDENT_AMBULATORY_CARE_PROVIDER_SITE_OTHER): Payer: Medicare Other | Admitting: Podiatry

## 2016-01-18 VITALS — Temp 98.6°F

## 2016-01-18 DIAGNOSIS — M2011 Hallux valgus (acquired), right foot: Secondary | ICD-10-CM

## 2016-01-18 DIAGNOSIS — Z9889 Other specified postprocedural states: Secondary | ICD-10-CM

## 2016-01-18 DIAGNOSIS — M2040 Other hammer toe(s) (acquired), unspecified foot: Secondary | ICD-10-CM

## 2016-01-18 DIAGNOSIS — M722 Plantar fascial fibromatosis: Secondary | ICD-10-CM

## 2016-01-18 MED ORDER — HYDROCODONE-ACETAMINOPHEN 5-325 MG PO TABS: 1.0000 | ORAL_TABLET | ORAL | 0 refills | Status: DC | PRN

## 2016-01-20 NOTE — Progress Notes (Signed)
He presents today date of surgery 01/12/2016 status post Julian IvoryAustin bunionectomy EPF and amputation of the second toe at the level of metatarsophalangeal joint on the right foot. He states that recently it has been a little tender because he's been up and about more than he should. Denies fever chills nausea vomiting muscle aches and pains.  Objective: Vital signs are stable alert and oriented 3. Pulses are palpable. Good range of motion first metatarsophalangeal joint right sutures are intact margins well coapted under request that we leave the stitches in for 1 more week since there is some edema.  Assessment: Well-healed surgical floor right.  Plan: Redress today dresser compressive dressing encouraged him to leave the dressing on for one more week follow-up with him at that time for suture removal.

## 2016-01-25 ENCOUNTER — Ambulatory Visit (INDEPENDENT_AMBULATORY_CARE_PROVIDER_SITE_OTHER): Payer: Medicare Other | Admitting: Podiatry

## 2016-01-25 ENCOUNTER — Ambulatory Visit (INDEPENDENT_AMBULATORY_CARE_PROVIDER_SITE_OTHER): Payer: Medicare Other

## 2016-01-25 DIAGNOSIS — M2040 Other hammer toe(s) (acquired), unspecified foot: Secondary | ICD-10-CM

## 2016-01-25 DIAGNOSIS — M722 Plantar fascial fibromatosis: Secondary | ICD-10-CM | POA: Diagnosis not present

## 2016-01-25 DIAGNOSIS — Z9889 Other specified postprocedural states: Secondary | ICD-10-CM | POA: Diagnosis not present

## 2016-01-25 DIAGNOSIS — M2011 Hallux valgus (acquired), right foot: Secondary | ICD-10-CM

## 2016-01-25 NOTE — Progress Notes (Signed)
He presents today for a follow-up of his bunionectomy endoscopic plantar fasciotomy and a dictation of the second digit on the right foot. He states it feels much better and he is happy to get the stitches out today.  Objective: Vital signs are stable he is alert and oriented 3 dress sterile dressing intact was removed demonstrates sutures are intact margins well coapted. This and dry flaky skin overlying incision sites but there is no signs of infection. No purulence no malodor.  Assessment: Well-healing surgical foot right.  Plan: I removed all the sutures today. He has great range of motion of the first metatarsophalangeal joint. Radiographs taken in the office today confirm good placement of the Osteotomy first metatarsal with internal fixation. I recommended use of his Darco shoe and getting back into regular shoewear and the next few weeks. I will follow-up with him in 2 weeks placed in a compression anklet and encouraged him to wash the foot and to more stress the skin.

## 2016-02-06 ENCOUNTER — Ambulatory Visit (INDEPENDENT_AMBULATORY_CARE_PROVIDER_SITE_OTHER): Payer: Medicare Other

## 2016-02-06 ENCOUNTER — Ambulatory Visit (INDEPENDENT_AMBULATORY_CARE_PROVIDER_SITE_OTHER): Payer: Medicare Other | Admitting: Podiatry

## 2016-02-06 DIAGNOSIS — M722 Plantar fascial fibromatosis: Secondary | ICD-10-CM | POA: Diagnosis not present

## 2016-02-06 DIAGNOSIS — M2011 Hallux valgus (acquired), right foot: Secondary | ICD-10-CM

## 2016-02-06 DIAGNOSIS — M2040 Other hammer toe(s) (acquired), unspecified foot: Secondary | ICD-10-CM

## 2016-02-06 DIAGNOSIS — Z9889 Other specified postprocedural states: Secondary | ICD-10-CM

## 2016-02-06 MED ORDER — HYDROCODONE-ACETAMINOPHEN 5-325 MG PO TABS: 1.0000 | ORAL_TABLET | ORAL | 0 refills | Status: DC | PRN

## 2016-02-06 NOTE — Progress Notes (Signed)
He is status post [place] bunion repair 01/05/2016 with an EPF and amputation of the second toe. He states that he is doing quite well though the foot is still swollen enough he cannot get it back into his regular work boot.  Objective: Vital signs are stable he is alert and oriented 3 pulses are palpable. Good range of motion first metatarsophalangeal joint of the right foot. Radiographs taken today in office 3 views right foot demonstrates I osteotomy of the first metatarsal which appears to be in good position. Healing well. No fractures are identified.  Assessment: Well-healing surgical foot right  Plan: We'll let him back into regular tennis shoes or a Darco shoe and will follow-up with him in 1 month

## 2016-02-11 ENCOUNTER — Other Ambulatory Visit: Payer: Self-pay | Admitting: Cardiology

## 2016-03-05 ENCOUNTER — Ambulatory Visit (INDEPENDENT_AMBULATORY_CARE_PROVIDER_SITE_OTHER): Payer: Medicare Other

## 2016-03-05 ENCOUNTER — Ambulatory Visit: Payer: Medicare Other | Admitting: Podiatry

## 2016-03-05 ENCOUNTER — Ambulatory Visit (INDEPENDENT_AMBULATORY_CARE_PROVIDER_SITE_OTHER): Payer: Medicare Other | Admitting: Podiatry

## 2016-03-05 DIAGNOSIS — M2011 Hallux valgus (acquired), right foot: Secondary | ICD-10-CM | POA: Diagnosis not present

## 2016-03-05 DIAGNOSIS — M2040 Other hammer toe(s) (acquired), unspecified foot: Secondary | ICD-10-CM | POA: Diagnosis not present

## 2016-03-05 MED ORDER — HYDROCODONE-ACETAMINOPHEN 5-325 MG PO TABS: 1.0000 | ORAL_TABLET | ORAL | 0 refills | Status: DC | PRN

## 2016-03-05 NOTE — Progress Notes (Signed)
He presents today almost 2 months status [facility] right foot and rotation of second toe and an EPF of the right foot. He states that he is doing so much better and is eager to get back to work. States that he has been wearing his work boot 8 days out of the week.  Objective: Vital signs are stable he is alert and oriented 3 moderate edema to the forefoot but has great range of motion of the first metatarsophalangeal joint. No pain on palpation or range of motion. Radiographs demonstrate well-healing osteotomy with screw fixation.  Assessment: Well-healing surgical foot right.  Plan: Encouraged him to get back to his regular routine and I will follow-up with him in 1 month to 6 weeks. He will notify us with questions or concerns.

## 2016-03-31 NOTE — Progress Notes (Signed)
Cardiology Office Note    Date:  04/01/2016   ID:  Julian SeminoleJames C Josten, DOB 03-08-1946, MRN [id number]  PCP:  Hollice EspyGATES,DONNA RUTH, MD  Cardiologist:  Armanda Magicraci Elic Vencill, MD   Chief Complaint  Patient presents with  . Coronary Artery Disease  . Hyperlipidemia    History of Present Illness:  Julian Weaver is a 71 y.o. 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 He is doing well. He denies any anginal hest pain, SOB, DOE, LE edema, dizziness or syncope. He is still smoking 4-5 cigarettes daily.  He denies claudication.   Past Medical History:  Diagnosis Date  . Allergic rhinitis   . Arthritis   . Carotid artery stenosis    1-39% bilateral dopplers 03/2015  . Coronary artery disease 04/13/2008   Drug eluting stent in RCA  . Hypercholesteremia   . Hypertension   . RBBB   . Renal disorder   . Tobacco abuse     Past Surgical History:  Procedure Laterality Date  . CAROTID STENT  2010  . SHOULDER ACROMIOPLASTY  2010    Current Medications: Outpatient Medications Prior to Visit  Medication Sig Dispense Refill  . amLODipine (NORVASC) 5 MG tablet TAKE 1 TABLET(5 MG) BY MOUTH DAILY 90 tablet 0  . aspirin 81 MG EC tablet Take 1 tablet (81 mg total) by mouth daily.    [name]. atorvastatin (LIPITOR) 20 MG tablet Take 1 tablet (20 mg total) by mouth daily. 30 tablet 11  . co-enzyme Q-10 30 MG capsule Take 100 mg by mouth daily.    [name]. EPINEPHrine (EPIPEN IJ) Inject as directed as needed (bee stings).     . promethazine (PHENERGAN) 25 MG tablet Take 1 tablet (25 mg total) by mouth every 8 (eight) hours as needed for nausea or vomiting. 20 tablet 0  . tamsulosin (FLOMAX) 0.4 MG CAPS capsule Take 1 capsule (0.4 mg total) by mouth daily. 30 capsule 0  . cephALEXin (KEFLEX) 500 MG capsule Take 1 capsule (500 mg total) by mouth 3 (three) times daily. (Patient not taking: Reported on 04/01/2016) 30 capsule 0  . diphenhydrAMINE (BENADRYL) 25 MG tablet Take 25  mg by mouth every 6 (six) hours as needed (for allergic reaction).    . fluticasone (FLONASE) 50 MCG/ACT nasal spray 2 sprays 2 (two) times daily as needed for allergies.   11  . HYDROcodone-acetaminophen (NORCO/VICODIN) 5-325 MG tablet Take 1-2 tablets by mouth every 4 (four) hours as needed. (Patient not taking: Reported on 04/01/2016) 20 tablet 0  . ondansetron (ZOFRAN ODT) 4 MG disintegrating tablet 4mg  ODT q4 hours prn nausea/vomit (Patient not taking: Reported on 04/01/2016) 15 tablet 0  . oxyCODONE-acetaminophen (PERCOCET) 10-325 MG tablet Take 1 tablet by mouth every 6 (six) hours as needed for pain. (Patient not taking: Reported on 04/01/2016) 30 tablet 0   No facility-administered medications prior to visit.      Allergies:   Bee venom   Social History   Social History  . Marital status: Married    Spouse name: N/A  . Number of children: N/A  . Years of education: N/A   Social History Main Topics  . Smoking status: Current Every Day Smoker    Packs/day: 0.50    Years: 30.00    Start date: 10/27/1982  . Smokeless tobacco: Never Used  . Alcohol use No  . Drug use: No  . Sexual activity: Not Asked   Other Topics  Concern  . None   Social History Narrative  . None     Family History:  The patient's family history includes Stroke in his father and mother.   ROS:   Please see the history of present illness.    ROS All other systems reviewed and are negative.  No flowsheet data found.     PHYSICAL EXAM:   VS:  BP 140/64   Pulse 71   Ht 5\' 9"  (1.753 m)   Wt 179 lb (81.2 kg)   BMI 26.43 kg/m    GEN: Well nourished, well developed, in no acute distress  HEENT: normal  Neck: no JVD, carotid bruits, or masses Cardiac: RRR; no murmurs, rubs, or gallops,no edema.  Intact distal pulses bilaterally.  Respiratory:  clear to auscultation bilaterally, normal work of breathing GI: soft, nontender, nondistended, + BS MS: no deformity or atrophy  Skin: warm and dry, no  rash Neuro:  Alert and Oriented x 3, Strength and sensation are intact Psych: euthymic mood, full affect  Wt Readings from Last 3 Encounters:  04/01/16 179 lb (81.2 kg)  12/05/15 170 lb (77.1 kg)  03/29/15 174 lb (78.9 kg)      Studies/Labs Reviewed:   EKG:  EKG is ordered today.  The ekg ordered today demonstrates NSR with RBBB  Recent Labs: 05/24/2015: ALT 21   Lipid Panel    Component Value Date/Time   CHOL 103 (L) 05/24/2015 0816   TRIG 78 05/24/2015 0816   HDL 41 05/24/2015 0816   CHOLHDL 2.5 05/24/2015 0816   VLDL 16 05/24/2015 0816   LDLCALC 46 05/24/2015 0816    Additional studies/ records that were reviewed today include:  none    ASSESSMENT:    1. Atherosclerosis of native coronary artery of native heart without angina pectoris   2. Essential hypertension, benign   3. Pure hypercholesterolemia   4. Bruit of right carotid artery   5. Bilateral carotid artery stenosis      PLAN:  In order of problems listed above:  1. ASCAD s/p remote PCI of the RCA in 2010.  He has not had any anginal symptoms.  Continue ASA/statin.  2. HTN - Bp controlled on current meds.  Continue amlodipine. 3. Hyperlipidemia - LDL goal < 70. Continue statin.  Check FLP and ALT.  4. Bilateral carotid artery stenosis 1-39% by dopplers 03/2015 with ght carotid artery bruit - check carotid dopplers in 1 year    Medication Adjustments/Labs and Tests Ordered: Current medicines are reviewed at length with the patient today.  Concerns regarding medicines are outlined above.  Medication changes, Labs and Tests ordered today are listed in the Patient Instructions below.  There are no Patient Instructions on file for this visit.   Signed, Armanda Magic, MD  04/01/2016 9:56 AM    [facility] [address] [place], [place], [place]  [address] Phone: [phone]; Fax: [phone]

## 2016-04-01 ENCOUNTER — Encounter: Payer: Self-pay | Admitting: Cardiology

## 2016-04-01 ENCOUNTER — Ambulatory Visit (INDEPENDENT_AMBULATORY_CARE_PROVIDER_SITE_OTHER): Payer: Medicare Other | Admitting: Cardiology

## 2016-04-01 VITALS — BP 140/64 | HR 71 | Ht 69.0 in | Wt 179.0 lb

## 2016-04-01 DIAGNOSIS — I10 Essential (primary) hypertension: Secondary | ICD-10-CM | POA: Diagnosis not present

## 2016-04-01 DIAGNOSIS — I6529 Occlusion and stenosis of unspecified carotid artery: Secondary | ICD-10-CM | POA: Insufficient documentation

## 2016-04-01 DIAGNOSIS — I2510 Atherosclerotic heart disease of native coronary artery without angina pectoris: Secondary | ICD-10-CM | POA: Diagnosis not present

## 2016-04-01 DIAGNOSIS — R0989 Other specified symptoms and signs involving the circulatory and respiratory systems: Secondary | ICD-10-CM | POA: Diagnosis not present

## 2016-04-01 DIAGNOSIS — I6523 Occlusion and stenosis of bilateral carotid arteries: Secondary | ICD-10-CM

## 2016-04-01 DIAGNOSIS — E7800 Pure hypercholesterolemia, unspecified: Secondary | ICD-10-CM | POA: Diagnosis not present

## 2016-04-01 MED ORDER — ATORVASTATIN CALCIUM 20 MG PO TABS: 20.0000 mg | ORAL_TABLET | Freq: Every day | ORAL | 3 refills | Status: DC

## 2016-04-01 MED ORDER — AMLODIPINE BESYLATE 5 MG PO TABS: 5.0000 mg | ORAL_TABLET | Freq: Every day | ORAL | 3 refills | Status: DC

## 2016-04-01 NOTE — Patient Instructions (Signed)
Medication Instructions:  Your physician recommends that you continue on your current medications as directed. Please refer to the Current Medication list given to you today.   Labwork: TODAY: LFTs, Lipids  Testing/Procedures: Your physician has requested that you have a carotid duplex in January, 2019. This test is an ultrasound of the carotid arteries in your neck. It looks at blood flow through these arteries that supply the brain with blood. Allow one hour for this exam. There are no restrictions or special instructions.  Follow-Up: Your physician wants you to follow-up in: 1 year with Dr. Mayford Knifeurner. You will receive a reminder letter in the mail two months in advance. If you don't receive a letter, please call our office to schedule the follow-up appointment.   Any Other Special Instructions Will Be Listed Below (If Applicable).     If you need a refill on your cardiac medications before your next appointment, please call your pharmacy.

## 2016-04-02 LAB — HEPATIC FUNCTION PANEL
ALBUMIN: 4 g/dL (ref 3.6–4.8)
ALT: 17 IU/L (ref 0–44)
AST: 20 IU/L (ref 0–40)
Alkaline Phosphatase: 111 IU/L (ref 39–117)
BILIRUBIN TOTAL: 0.3 mg/dL (ref 0.0–1.2)
BILIRUBIN, DIRECT: 0.13 mg/dL (ref 0.00–0.40)
Total Protein: 6.3 g/dL (ref 6.0–8.5)

## 2016-04-02 LAB — LIPID PANEL
CHOL/HDL RATIO: 2.5 ratio (ref 0.0–5.0)
Cholesterol, Total: 99 mg/dL — ABNORMAL LOW (ref 100–199)
HDL: 40 mg/dL (ref >39)
LDL Calculated: 47 mg/dL (ref 0–99)
Triglycerides: 58 mg/dL (ref 0–149)
VLDL CHOLESTEROL CAL: 12 mg/dL (ref 5–40)

## 2016-04-15 ENCOUNTER — Encounter (INDEPENDENT_AMBULATORY_CARE_PROVIDER_SITE_OTHER): Payer: Self-pay | Admitting: Orthopedic Surgery

## 2016-04-15 ENCOUNTER — Ambulatory Visit (INDEPENDENT_AMBULATORY_CARE_PROVIDER_SITE_OTHER): Payer: Medicare Other | Admitting: Orthopedic Surgery

## 2016-04-15 DIAGNOSIS — M25562 Pain in left knee: Secondary | ICD-10-CM | POA: Diagnosis not present

## 2016-04-15 DIAGNOSIS — G8929 Other chronic pain: Secondary | ICD-10-CM | POA: Diagnosis not present

## 2016-04-15 MED ORDER — METHYLPREDNISOLONE ACETATE 40 MG/ML IJ SUSP
40.0000 mg | INTRAMUSCULAR | Status: AC | PRN
  Administered 2016-04-15: 40 mg via INTRA_ARTICULAR

## 2016-04-15 MED ORDER — BUPIVACAINE HCL 0.25 % IJ SOLN
4.0000 mL | INTRAMUSCULAR | Status: AC | PRN
  Administered 2016-04-15: 4 mL via INTRA_ARTICULAR

## 2016-04-15 MED ORDER — LIDOCAINE HCL 1 % IJ SOLN
5.0000 mL | INTRAMUSCULAR | Status: AC | PRN
  Administered 2016-04-15: 5 mL

## 2016-04-15 NOTE — Progress Notes (Signed)
Office Visit Note   Patient: Julian SeminoleJames C Castrillo           Date of Birth: 12/02/1945           MRN: [id number] Visit Date: 04/15/2016 Requested by: Shaune Pollackonna Gates, MD [address] [place], [place] [address] PCP: Hollice EspyGATES,DONNA RUTH, MD  Subjective: Chief Complaint  Patient presents with  . Left Knee - Pain, Edema    HPI patient is an active 71 year old patient with left knee pain.  He woke up at 2 a.m. and it was burning and he can get back to sleep.  He has no history of gout or pseudogout.  He states it has been swollen for several days.  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  He has had aspirations and injections of the knee before.  The last one was over a year ago with Dr. Cleophas DunkerWhitfield.  He denies a history of injury but he does state that he had a recent right foot surgery which is causing to put more weight on the left leg.              Review of Systems All systems reviewed are negative as they relate to the chief complaint within the history of present illness.  Patient denies  fevers or chills.    Assessment & Plan: Visit Diagnoses:  1. Chronic pain of left knee     Plan: Impression is left knee arthritis and effusion with no evidence of systemic infection gout or pseudogout.  Plan is aspiration injection today.  That is helped him in the past.  He'll need repeat radiographs on return office visit.  He plans on continuing to work in [employer] for at least 2 more years.  Follow-Up Instructions: Return if symptoms worsen or fail to improve.   Orders:  No orders of the defined types were placed in this encounter.  No orders of the defined types were placed in this encounter.     Procedures: Large Joint Inj Date/Time: 04/15/2016 5:24 PM Performed by: Cammy CopaEAN, SCOTT Rick Warnick Authorized by: Cammy CopaEAN, SCOTT Inice Sanluis   Consent Given by:  Patient Site marked: the procedure site was marked   Timeout: prior to procedure the correct patient,  procedure, and site was verified   Indications:  Pain, joint swelling and diagnostic evaluation Location:  Knee Site:  L knee Prep: patient was prepped and draped in usual sterile fashion   Needle Size:  18 G Needle Length:  1.5 inches Approach:  Superolateral Ultrasound Guidance: No   Fluoroscopic Guidance: No   Arthrogram: No   Medications:  5 mL lidocaine 1 %; 4 mL bupivacaine 0.25 %; 40 mg methylPREDNISolone acetate 40 MG/ML Aspiration Attempted: Yes   Aspirate amount (mL):  60 Aspirate:  Yellow Patient tolerance:  Patient tolerated the procedure well with no immediate complications     Clinical Data: No additional findings.  Objective: Vital Signs: There were no vitals taken for this visit.  Physical Exam   Constitutional: Patient appears well-developed HEENT:  Head: Normocephalic Eyes:EOM are normal Neck: Normal range of motion Cardiovascular: Normal rate Pulmonary/chest: Effort normal Neurologic: Patient is alert Skin: Skin is warm Psychiatric: Patient has normal mood and affect    Ortho Exam patient has slightly antalgic gait to the left.  He does have a left knee effusion.  Extensor mechanism is intact.  Collateral crucial ligaments are stable.  No groin pain with internal/external rotation of the leg.  Pedal pulses palpable bilaterally.  No other masses lymph adenopathy or skin changes noted in the left knee region.  He does have medial greater than lateral joint line tenderness and equivocal McMurray compression testing  Specialty Comments:  No specialty comments available.  Imaging: No results found.   PMFS History: Patient Active Problem List   Diagnosis Date Noted  . Carotid artery bruit 04/01/2016  . Carotid artery stenosis   . Cigarette smoker 10/27/2014  . 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 Left inguinal hernia 11/12/2013  . Coronary atherosclerosis of native coronary artery 03/11/2013  . Pure hypercholesterolemia 03/11/2013   . Essential hypertension, benign 03/11/2013  . Encounter for long-term (current) use of other medications 03/11/2013  . Bruit 03/11/2013   Past Medical History:  Diagnosis Date  . Allergic rhinitis   . Arthritis   . Carotid artery stenosis    1-39% bilateral dopplers 03/2015  . Coronary artery disease 04/13/2008   Drug eluting stent in RCA  . Hypercholesteremia   . Hypertension   . RBBB   . Renal disorder   . Tobacco abuse     Family History  Problem Relation Age of Onset  . Stroke Mother   . Stroke Father     Past Surgical History:  Procedure Laterality Date  . CAROTID STENT  2010  . SHOULDER ACROMIOPLASTY  2010   Social History   Occupational History  . Not on file.   Social History Main Topics  . Smoking status: Current Every Day Smoker    Packs/day: 0.50    Years: 30.00    Start date: 10/27/1982  . Smokeless tobacco: Never Used  . Alcohol use No  . Drug use: No  . Sexual activity: Not on file

## 2016-04-16 ENCOUNTER — Ambulatory Visit (INDEPENDENT_AMBULATORY_CARE_PROVIDER_SITE_OTHER): Payer: Medicare Other | Admitting: Podiatry

## 2016-04-16 ENCOUNTER — Ambulatory Visit (INDEPENDENT_AMBULATORY_CARE_PROVIDER_SITE_OTHER): Payer: Medicare Other

## 2016-04-16 ENCOUNTER — Encounter: Payer: Self-pay | Admitting: Podiatry

## 2016-04-16 VITALS — BP 131/69 | HR 75 | Resp 16

## 2016-04-16 DIAGNOSIS — M2040 Other hammer toe(s) (acquired), unspecified foot: Secondary | ICD-10-CM

## 2016-04-16 DIAGNOSIS — M722 Plantar fascial fibromatosis: Secondary | ICD-10-CM | POA: Diagnosis not present

## 2016-04-16 DIAGNOSIS — M2011 Hallux valgus (acquired), right foot: Secondary | ICD-10-CM

## 2016-04-17 NOTE — Progress Notes (Signed)
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 He states that he is doing much better he is back to his regular routine and has no problems. He states it is still appears to be swelling some but it is not painful. He states that the toe occasionally hurts if he is on it too much.  Objective: Vital signs are stable he is alert and oriented 3. Pulses are palpable. Neurologic sensorium is unchanged. Mild edema. Great range of motion of the first metatarsophalangeal joint well-healed surgical amputation site. Radiographs taken today demonstrate well healing osteotomies. He has no pain on palpation of the plantar fascia.  Assessment: Well-healing surgical foot right.  Plan: Follow up with me on an as-needed basis return to regular activity.

## 2016-05-07 ENCOUNTER — Ambulatory Visit (INDEPENDENT_AMBULATORY_CARE_PROVIDER_SITE_OTHER): Payer: Medicare Other | Admitting: Podiatry

## 2016-05-07 DIAGNOSIS — M722 Plantar fascial fibromatosis: Secondary | ICD-10-CM

## 2016-05-07 NOTE — Patient Instructions (Signed)

## 2016-05-07 NOTE — Progress Notes (Signed)
Patient presents for orthotic pick up.  Verbal and written break in and wear instructions given.  Patient will follow up in 4 weeks if symptoms worsen or fail to improve. 

## 2016-05-15 ENCOUNTER — Encounter (INDEPENDENT_AMBULATORY_CARE_PROVIDER_SITE_OTHER): Payer: Self-pay | Admitting: Orthopedic Surgery

## 2016-05-15 ENCOUNTER — Ambulatory Visit (INDEPENDENT_AMBULATORY_CARE_PROVIDER_SITE_OTHER): Payer: Medicare Other | Admitting: Orthopedic Surgery

## 2016-05-15 ENCOUNTER — Ambulatory Visit (INDEPENDENT_AMBULATORY_CARE_PROVIDER_SITE_OTHER): Payer: Medicare Other

## 2016-05-15 DIAGNOSIS — M25562 Pain in left knee: Principal | ICD-10-CM

## 2016-05-15 DIAGNOSIS — G8929 Other chronic pain: Secondary | ICD-10-CM

## 2016-05-15 DIAGNOSIS — M25462 Effusion, left knee: Secondary | ICD-10-CM

## 2016-05-15 MED ORDER — LIDOCAINE HCL 1 % IJ SOLN
5.0000 mL | INTRAMUSCULAR | Status: AC | PRN
  Administered 2016-05-15: 5 mL

## 2016-05-15 NOTE — Progress Notes (Signed)
Office Visit Note   Patient: Julian Weaver           Date of Birth: [birth year]           MRN: [id number] Visit Date: 05/15/2016 Requested by: Shaune Pollackonna Gates, MD [address] [place], [place] [address] PCP: Hollice EspyGATES,DONNA RUTH, MD  Subjective: Chief Complaint  Patient presents with  . Left Knee - Edema, Pain    HPI Julian Weaver is a 71 year old patient with left knee pain and swelling.  Had an aspiration and injection performed in the left knee 04/15/2016.  Reported recurrent swelling and pain since to 1818.  This is been primarily medial with some mechanical symptoms.  Currently on no anti-inflammatories for his pain but he is been on this before.              Review of Systems All systems reviewed are negative as they relate to the chief complaint within the history of present illness.  Patient denies  fevers or chills.    Assessment & Plan: Visit Diagnoses:  1. Chronic pain of left knee   2. Swelling of left knee joint     Plan: Impression is left knee recurrent effusion medial joint line tenderness mechanical symptoms in a patient who is very active.  I think he has fairly minimal degenerative changes and this likely represents a symptomatic meniscal tear.  Would like to get MRI scan of the left knee to evaluate for meniscal tear.  I did do an aspiration today.  No injection performed.  I'll see him back after that study  Follow-Up Instructions: No Follow-up on file.   Orders:  No orders of the defined types were placed in this encounter.  No orders of the defined types were placed in this encounter.     Procedures: Large Joint Inj Date/Time: 05/15/2016 7:08 PM Performed by: Cammy CopaEAN, SCOTT Arynn Armand Authorized by: Cammy CopaEAN, SCOTT Samaiyah Howes   Consent Given by:  Patient Site marked: the procedure site was marked   Timeout: prior to procedure the correct patient, procedure, and site was verified   Indications:  Pain, joint swelling and diagnostic evaluation Location:   Knee Site:  L knee Prep: patient was prepped and draped in usual sterile fashion   Needle Size:  18 G Needle Length:  1.5 inches Approach:  Superolateral Ultrasound Guidance: No   Fluoroscopic Guidance: No   Arthrogram: No   Medications:  5 mL lidocaine 1 % Aspiration Attempted: Yes   Aspirate amount (mL):  40 Aspirate:  Yellow Patient tolerance:  Patient tolerated the procedure well with no immediate complications     Clinical Data: No additional findings.  Objective: Vital Signs: There were no vitals taken for this visit.  Physical Exam   Constitutional: Patient appears well-developed HEENT:  Head: Normocephalic Eyes:EOM are normal Neck: Normal range of motion Cardiovascular: Normal rate Pulmonary/chest: Effort normal Neurologic: Patient is alert Skin: Skin is warm Psychiatric: Patient has normal mood and affect    Ortho Exam orthopedic exam demonstrates left knee effusion medial joint line tenderness positive McMurray compression testing stable collateral crucial ligaments no focal tenderness on the patellar or quad tendon palpable pedal pulses no other masses lymph adenopathy or skin changes noted in the left knee region  Specialty Comments:  No specialty comments available.  Imaging: No results found.   PMFS History: Patient Active Problem List   Diagnosis Date Noted  . Chronic pain of left knee 05/15/2016  . Swelling of left knee joint 05/15/2016  .  Carotid artery bruit 04/01/2016  . Carotid artery stenosis   . Cigarette smoker 10/27/2014  . 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 Left inguinal hernia 11/12/2013  . Coronary atherosclerosis of native coronary artery 03/11/2013  . Pure hypercholesterolemia 03/11/2013  . Essential hypertension, benign 03/11/2013  . Encounter for long-term (current) use of other medications 03/11/2013  . Bruit 03/11/2013   Past Medical History:  Diagnosis Date  . Allergic rhinitis   . Arthritis   .  Carotid artery stenosis    1-39% bilateral dopplers 03/2015  . Coronary artery disease 04/13/2008   Drug eluting stent in RCA  . Hypercholesteremia   . Hypertension   . RBBB   . Renal disorder   . Tobacco abuse     Family History  Problem Relation Age of Onset  . Stroke Mother   . Stroke Father     Past Surgical History:  Procedure Laterality Date  . CAROTID STENT  2010  . SHOULDER ACROMIOPLASTY  2010   Social History   Occupational History  . Not on file.   Social History Main Topics  . Smoking status: Current Every Day Smoker    Packs/day: 0.50    Years: 30.00    Start date: 10/27/1982  . Smokeless tobacco: Never Used  . Alcohol use No  . Drug use: No  . Sexual activity: Not on file

## 2016-05-28 ENCOUNTER — Ambulatory Visit
Admission: RE | Admit: 2016-05-28 | Discharge: 2016-05-28 | Disposition: A | Discharge status: Home / Self Care | Payer: Medicare Other | Source: Ambulatory Visit | Attending: Orthopedic Surgery | Admitting: Orthopedic Surgery

## 2016-05-28 DIAGNOSIS — M25562 Pain in left knee: Secondary | ICD-10-CM | POA: Diagnosis not present

## 2016-05-28 DIAGNOSIS — G8929 Other chronic pain: Secondary | ICD-10-CM

## 2016-06-01 ENCOUNTER — Other Ambulatory Visit: Payer: Self-pay | Admitting: Cardiology

## 2016-06-03 NOTE — Telephone Encounter (Signed)
Medication Detail    Disp Refills Start End   amLODipine (NORVASC) 5 MG tablet 90 tablet 3 04/01/2016    Sig - Route: Take 1 tablet (5 mg total) by mouth daily. - Oral   E-Prescribing Status: Receipt confirmed by pharmacy (04/01/2016 10:00 AM EST)   Pharmacy   [facility] [id number] - [place], [place] - [address] AT [facility] [address] & [address]

## 2016-06-12 ENCOUNTER — Encounter (INDEPENDENT_AMBULATORY_CARE_PROVIDER_SITE_OTHER): Payer: Self-pay | Admitting: Orthopedic Surgery

## 2016-06-12 ENCOUNTER — Ambulatory Visit (INDEPENDENT_AMBULATORY_CARE_PROVIDER_SITE_OTHER): Payer: Medicare Other | Admitting: Orthopedic Surgery

## 2016-06-12 DIAGNOSIS — I6523 Occlusion and stenosis of bilateral carotid arteries: Secondary | ICD-10-CM

## 2016-06-12 DIAGNOSIS — M25562 Pain in left knee: Secondary | ICD-10-CM

## 2016-06-12 NOTE — Progress Notes (Signed)
Office Visit Note   Patient: Julian Weaver           Date of Birth: 1945-09-19           MRN: [id number] Visit Date: 06/12/2016 Requested by: Shaune Pollack, MD [address] [place], [place] [address] PCP: Hollice Espy, MD  Subjective: Chief Complaint  Patient presents with  . 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  Here for review of MRI scan.  MRI scan shows complex medial meniscal tear along with some degenerative changes in the knee.  He's not taking any medications for pain but the swelling has decreased.  He is using a kneepad at work.  Denies much in the way of mechanical symptoms but does report medial pain              ROS: All systems reviewed are negative as they relate to the chief complaint within the history of present illness.  Patient denies  fevers or chills.   Assessment & Plan: Visit Diagnoses:  1. Left knee pain, unspecified chronicity     Plan: Impression left knee medial meniscal tear relatively asymptomatic at this time.  He still is having some occasional medial knee symptoms.  I discussed with him operative and nonoperative options for this problem.  I think if his symptoms come back and he would consider an injection.  He does have a meniscal flap it's underneath that tibial plateau which I think is giving him trouble but at this point he would prefer not to spend the time out of work that would be required to recover from knee arthroscopy and partial medial meniscectomy.  I will see him back as needed  Follow-Up Instructions: Return if symptoms worsen or fail to improve.   Orders:  No orders of the defined types were placed in this encounter.  No orders of the defined types were placed in this encounter.     Procedures: No procedures performed   Clinical Data: No additional findings.  Objective: Vital Signs: There were no vitals taken for this visit.  Physical  Exam:   Constitutional: Patient appears well-developed HEENT:  Head: Normocephalic Eyes:EOM are normal Neck: Normal range of motion Cardiovascular: Normal rate Pulmonary/chest: Effort normal Neurologic: Patient is alert Skin: Skin is warm Psychiatric: Patient has normal mood and affect    Ortho Exam: Orthopedic exam demonstrates full active and passive range of motion no left knee effusion collateral crucial ligaments are stable mild joint space tenderness medially  is notpedal pulses palpable range of motion otherwise intact collateral crucial ligaments stable pecialty Comments:  No specialty comments available.  Imaging: No results found.   PMFS History: Patient Active Problem List   Diagnosis Date Noted  . Chronic pain of left knee 05/15/2016  . Swelling of left knee joint 05/15/2016  . Carotid artery bruit 04/01/2016  . Carotid artery stenosis   . Cigarette smoker 10/27/2014  . 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 Left inguinal hernia 11/12/2013  . Coronary atherosclerosis of native coronary artery 03/11/2013  . Pure hypercholesterolemia 03/11/2013  . Essential hypertension, benign 03/11/2013  . Encounter for long-term (current) use of other medications 03/11/2013  . Bruit 03/11/2013   Past Medical History:  Diagnosis Date  . Allergic rhinitis   . Arthritis   . Carotid artery stenosis    1-39% bilateral dopplers 03/2015  . Coronary artery disease 04/13/2008   Drug eluting stent  in RCA  . Hypercholesteremia   . Hypertension   . RBBB   . Renal disorder   . Tobacco abuse     Family History  Problem Relation Age of Onset  . Stroke Mother   . Stroke Father     Past Surgical History:  Procedure Laterality Date  . CAROTID STENT  2010  . SHOULDER ACROMIOPLASTY  2010   Social History   Occupational History  . Not on file.   Social History Main Topics  . Smoking status: Current Every Day Smoker    Packs/day: 0.50    Years: 30.00    Start  date: 10/27/1982  . Smokeless tobacco: Never Used  . Alcohol use No  . Drug use: No  . Sexual activity: Not on file

## 2016-06-18 ENCOUNTER — Ambulatory Visit (INDEPENDENT_AMBULATORY_CARE_PROVIDER_SITE_OTHER): Payer: Medicare Other | Admitting: Podiatry

## 2016-06-18 ENCOUNTER — Encounter: Payer: Self-pay | Admitting: Podiatry

## 2016-06-18 ENCOUNTER — Ambulatory Visit (INDEPENDENT_AMBULATORY_CARE_PROVIDER_SITE_OTHER): Payer: Medicare Other

## 2016-06-18 DIAGNOSIS — I6523 Occlusion and stenosis of bilateral carotid arteries: Secondary | ICD-10-CM

## 2016-06-18 DIAGNOSIS — M2450 Contracture, unspecified joint: Secondary | ICD-10-CM

## 2016-06-18 DIAGNOSIS — M2011 Hallux valgus (acquired), right foot: Secondary | ICD-10-CM

## 2016-06-18 NOTE — Patient Instructions (Signed)
Pre-Operative Instructions  Congratulations, you have decided to take an important step to improving your quality of life.  You can be assured that the doctors of [facility] will be with you every step of the way.  1. Plan to be at the surgery center/hospital at least 1 (one) hour prior to your scheduled time unless otherwise directed by the surgical center/hospital staff.  You must have a responsible adult accompany you, remain during the surgery and drive you home.  Make sure you have directions to the surgical center/hospital and know how to get there on time. 2. For hospital based surgery you will need to obtain a history and physical form from your family physician within 1 month prior to the date of surgery- we will give you a form for you primary physician.  3. We make every effort to accommodate the date you request for surgery.  There are however, times where surgery dates or times have to be moved.  We will contact you as soon as possible if a change in schedule is required.   4. No Aspirin/Ibuprofen for one week before surgery.  If you are on aspirin, any non-steroidal anti-inflammatory medications (Mobic, Aleve, Ibuprofen) you should stop taking it 7 days prior to your surgery.  You make take Tylenol  For pain prior to surgery.  5. Medications- If you are taking daily heart and blood pressure medications, seizure, reflux, allergy, asthma, anxiety, pain or diabetes medications, make sure the surgery center/hospital is aware before the day of surgery so they may notify you which medications to take or avoid the day of surgery. 6. No food or drink after midnight the night before surgery unless directed otherwise by surgical center/hospital staff. 7. No alcoholic beverages 24 hours prior to surgery.  No smoking 24 hours prior to or 24 hours after surgery. 8. Wear loose pants or shorts- loose enough to fit over bandages, boots, and casts. 9. No slip on shoes, sneakers are best. 10. Bring  your boot with you to the surgery center/hospital.  Also bring crutches or a walker if your physician has prescribed it for you.  If you do not have this equipment, it will be provided for you after surgery. 11. If you have not been contracted by the surgery center/hospital by the day before your surgery, call to confirm the date and time of your surgery. 12. Leave-time from work may vary depending on the type of surgery you have.  Appropriate arrangements should be made prior to surgery with your employer. 13. Prescriptions will be provided immediately following surgery by your doctor.  Have these filled as soon as possible after surgery and take the medication as directed. 14. Remove nail polish on the operative foot. 15. Wash the night before surgery.  The night before surgery wash the foot and leg well with the antibacterial soap provided and water paying special attention to beneath the toenails and in between the toes.  Rinse thoroughly with water and dry well with a towel.  Perform this wash unless told not to do so by your physician.  Enclosed: 1 Ice pack (please put in freezer the night before surgery)   1 Hibiclens skin cleaner   Pre-op Instructions  If you have any questions regarding the instructions, do not hesitate to call our office.  [place]: [address] [place], [place] [address] [phone]  [place]: [address] [place], [place] [address] [phone]  [place]: [address]  [place], [place] [address] [phone]   Dr.   Norman Regal DPM, Dr. Matthew Wagoner DPM, Dr. M. Todd Hyatt DPM, Dr. Titorya Stover DPM 

## 2016-06-19 NOTE — Progress Notes (Signed)
He presents today with a chief complaint of pain this beneath the first metatarsophalangeal joint of the right foot. 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 Everything is doing very well with the exception of the first metatarsophalangeal joint. No trauma no change in his past medical history medications or allergies.  Objective: Vital signs are stable he is alert and oriented 3 pulses are palpable. Pulses are palpable. No change in physical exam other than contracted first metatarsophalangeal joint dorsally. When standing the hallux does not fully purchase the ground resulting in ambulation upon the sesamoids. Radiographs demonstrate similar findings with extension of the first metatarsophalangeal joint.  Contracted first metatarsophalangeal joint status [facility].  Plan: We discussed the etiology pathology inserted versus surgical therapies. At this point I feel that is more than likely be his best option to go any reduce the first metatarsophalangeal joint. He understands this and is amenable to it. We did discuss the possible postop complications which may include but are not limited to postop painfully swelling infection recurrence need further surgery overcorrection under correction necessity for physical therapy afterwards. He understands this and is amenable to it. Signed operative consent form and I will follow-up with him in the near future for surgical intervention.

## 2016-10-30 ENCOUNTER — Other Ambulatory Visit (INDEPENDENT_AMBULATORY_CARE_PROVIDER_SITE_OTHER): Payer: Self-pay | Admitting: Specialist

## 2016-10-31 NOTE — Telephone Encounter (Signed)
Tramadol refill request 

## 2016-11-01 NOTE — Telephone Encounter (Signed)
Called to [facility] 

## 2016-12-02 DIAGNOSIS — H9193 Unspecified hearing loss, bilateral: Secondary | ICD-10-CM | POA: Diagnosis not present

## 2016-12-02 DIAGNOSIS — Z91030 Bee allergy status: Secondary | ICD-10-CM | POA: Diagnosis not present

## 2016-12-02 DIAGNOSIS — Z125 Encounter for screening for malignant neoplasm of prostate: Secondary | ICD-10-CM | POA: Diagnosis not present

## 2016-12-02 DIAGNOSIS — Z0000 Encounter for general adult medical examination without abnormal findings: Secondary | ICD-10-CM | POA: Diagnosis not present

## 2016-12-02 DIAGNOSIS — I700 Atherosclerosis of aorta: Secondary | ICD-10-CM | POA: Diagnosis not present

## 2016-12-02 DIAGNOSIS — M62838 Other muscle spasm: Secondary | ICD-10-CM | POA: Diagnosis not present

## 2016-12-02 DIAGNOSIS — M1991 Primary osteoarthritis, unspecified site: Secondary | ICD-10-CM | POA: Diagnosis not present

## 2016-12-02 DIAGNOSIS — J449 Chronic obstructive pulmonary disease, unspecified: Secondary | ICD-10-CM | POA: Diagnosis not present

## 2016-12-02 DIAGNOSIS — F17200 Nicotine dependence, unspecified, uncomplicated: Secondary | ICD-10-CM | POA: Diagnosis not present

## 2016-12-02 DIAGNOSIS — I10 Essential (primary) hypertension: Secondary | ICD-10-CM | POA: Diagnosis not present

## 2016-12-02 DIAGNOSIS — I2510 Atherosclerotic heart disease of native coronary artery without angina pectoris: Secondary | ICD-10-CM | POA: Diagnosis not present

## 2016-12-02 DIAGNOSIS — E7800 Pure hypercholesterolemia, unspecified: Secondary | ICD-10-CM | POA: Diagnosis not present

## 2016-12-02 DIAGNOSIS — Z1159 Encounter for screening for other viral diseases: Secondary | ICD-10-CM | POA: Diagnosis not present

## 2016-12-03 ENCOUNTER — Other Ambulatory Visit: Payer: Self-pay | Admitting: Acute Care

## 2016-12-03 DIAGNOSIS — Z122 Encounter for screening for malignant neoplasm of respiratory organs: Secondary | ICD-10-CM

## 2016-12-03 DIAGNOSIS — F17210 Nicotine dependence, cigarettes, uncomplicated: Principal | ICD-10-CM

## 2016-12-15 DIAGNOSIS — Z23 Encounter for immunization: Secondary | ICD-10-CM | POA: Diagnosis not present

## 2016-12-18 ENCOUNTER — Ambulatory Visit (INDEPENDENT_AMBULATORY_CARE_PROVIDER_SITE_OTHER)
Admission: RE | Admit: 2016-12-18 | Discharge: 2016-12-18 | Disposition: A | Discharge status: Home / Self Care | Payer: Medicare Other | Source: Ambulatory Visit | Attending: Acute Care | Admitting: Acute Care

## 2016-12-18 ENCOUNTER — Ambulatory Visit (INDEPENDENT_AMBULATORY_CARE_PROVIDER_SITE_OTHER): Payer: Medicare Other | Admitting: Acute Care

## 2016-12-18 ENCOUNTER — Encounter: Payer: Self-pay | Admitting: Acute Care

## 2016-12-18 DIAGNOSIS — F17210 Nicotine dependence, cigarettes, uncomplicated: Secondary | ICD-10-CM

## 2016-12-18 DIAGNOSIS — Z87891 Personal history of nicotine dependence: Secondary | ICD-10-CM

## 2016-12-18 DIAGNOSIS — Z122 Encounter for screening for malignant neoplasm of respiratory organs: Secondary | ICD-10-CM

## 2016-12-18 NOTE — Progress Notes (Signed)
Shared Decision Making Visit Lung Cancer Screening Program [id number])   Eligibility:  Age 71 y.o.  Pack Years Smoking History Calculation 84-pack-year smoking history (# packs/per year x # years smoked)  Recent History of coughing up blood  no  Unexplained weight loss? no ( >Than 15 pounds within the last 6 months )  Prior History Lung / other cancer no (Diagnosis within the last 5 years already requiring surveillance chest CT Scans).  Smoking Status Current Smoker  Former Smokers: Years since quit: Not applicable  Quit Date: Not applicable  Visit Components:  Discussion included one or more decision making aids. yes  Discussion included risk/benefits of screening. yes  Discussion included potential follow up diagnostic testing for abnormal scans. yes  Discussion included meaning and risk of over diagnosis. yes  Discussion included meaning and risk of False Positives. yes  Discussion included meaning of total radiation exposure. yes  Counseling Included:  Importance of adherence to annual lung cancer LDCT screening. yes  Impact of comorbidities on ability to participate in the program. yes  Ability and willingness to under diagnostic treatment. yes  Smoking Cessation Counseling:  Current Smokers:   Discussed importance of smoking cessation. yes  Information about tobacco cessation classes and interventions provided to patient. yes  Patient provided with "ticket" for LDCT Scan. yes  Symptomatic Patient. no  Counseling  Diagnosis Code: Tobacco Use Z72.0  Asymptomatic Patient yes  Counseling (Intermediate counseling: > three minutes counseling) H8469  Former Smokers:   Discussed the importance of maintaining cigarette abstinence. yes  Diagnosis Code: Personal History of Nicotine Dependence. G29.528  Information about tobacco cessation classes and interventions provided to patient. Yes  Patient provided with "ticket" for LDCT Scan. yes  Written Order  for Lung Cancer Screening with LDCT placed in Epic. Yes (CT Chest Lung Cancer Screening Low Dose W/O CM) [id number] Z12.2-Screening of respiratory organs Z87.891-Personal history of nicotine dependence  I have spent 25 minutes of face to face time with Mr. Makris discussing the risks and benefits of lung cancer screening. We viewed a power point together that explained in detail the above noted topics. We paused at intervals to allow for questions to be asked and answered to ensure understanding.We discussed that the single most powerful action that he can take to decrease his risk of developing lung cancer is to quit smoking. We discussed whether or not he is ready to commit to setting a quit date. He is currently not ready to quit smoking. He has been successful in the past using Wellbutrin. We discussed options for tools to aid in quitting smoking including nicotine replacement therapy, non-nicotine medications, support groups, Quit Smart classes, and behavior modification. We discussed that often times setting smaller, more achievable goals, such as eliminating 1 cigarette a day for a week and then 2 cigarettes a day for a week can be helpful in slowly decreasing the number of cigarettes smoked. This allows for a sense of accomplishment as well as providing a clinical benefit. I gave Mr. Vejar the " Be Stronger Than Your Excuses" card with contact information for community resources, classes, free nicotine replacement therapy, and access to mobile apps, text messaging, and on-line smoking cessation help. I have also given him my card and contact information in the event he needs to contact me. We discussed the time and location of the scan, and that either Abigail Miyamoto RN or I will call with the results within 24-48 hours of receiving them. I have offered Mr. Siedschlag  a copy of the power point we viewed  as a resource in the event they need reinforcement of the concepts we discussed today in the  office. The patient verbalized understanding of all of  the above and had no further questions upon leaving the office. They have my contact information in the event they have any further questions.  I spent 4 minutes counseling on smoking cessation and the health risks of continued tobacco abuse.  I explained to the patient that there has been a high incidence of coronary artery disease noted on these exams. I explained that this is a non-gated exam therefore degree or severity cannot be determined. This patient is currently on statin therapy. He has had a stent placed and is followed by cardiology. Mr. Morawski verbalized understanding of the above and had no further questions upon completion of the visit.       Bevelyn Ngo, NP 12/18/2016

## 2016-12-23 ENCOUNTER — Telehealth: Payer: Self-pay | Admitting: *Deleted

## 2016-12-23 ENCOUNTER — Ambulatory Visit (INDEPENDENT_AMBULATORY_CARE_PROVIDER_SITE_OTHER): Payer: Medicare Other | Admitting: Physician Assistant

## 2016-12-23 ENCOUNTER — Encounter: Payer: Self-pay | Admitting: Physician Assistant

## 2016-12-23 VITALS — BP 120/70 | HR 72 | Ht 69.0 in | Wt 176.0 lb

## 2016-12-23 DIAGNOSIS — I10 Essential (primary) hypertension: Secondary | ICD-10-CM

## 2016-12-23 DIAGNOSIS — I2510 Atherosclerotic heart disease of native coronary artery without angina pectoris: Secondary | ICD-10-CM | POA: Diagnosis not present

## 2016-12-23 DIAGNOSIS — I6523 Occlusion and stenosis of bilateral carotid arteries: Secondary | ICD-10-CM

## 2016-12-23 DIAGNOSIS — F17210 Nicotine dependence, cigarettes, uncomplicated: Secondary | ICD-10-CM | POA: Diagnosis not present

## 2016-12-23 LAB — BASIC METABOLIC PANEL
BUN / CREAT RATIO: 15 (ref 10–24)
BUN: 14 mg/dL (ref 8–27)
CHLORIDE: 105 mmol/L (ref 96–106)
CO2: 24 mmol/L (ref 20–29)
Calcium: 8.6 mg/dL (ref 8.6–10.2)
Creatinine, Ser: 0.94 mg/dL (ref 0.76–1.27)
GFR calc non Af Amer: 82 mL/min/{1.73_m2} (ref >59)
GFR, EST AFRICAN AMERICAN: 95 mL/min/{1.73_m2} (ref >59)
Glucose: 92 mg/dL (ref 65–99)
POTASSIUM: 4 mmol/L (ref 3.5–5.2)
Sodium: 139 mmol/L (ref 134–144)

## 2016-12-23 MED ORDER — HYDROCHLOROTHIAZIDE 25 MG PO TABS: 25.0000 mg | ORAL_TABLET | Freq: Every day | ORAL | 5 refills | Status: DC

## 2016-12-23 NOTE — Progress Notes (Signed)
Cardiology Office Note    Date:  12/23/2016   ID:  Julian Weaver, DOB [birth year], MRN [id number]  PCP:  Shaune Pollack, MD  Cardiologist: Dr. Mayford Knife  Chief Complaint  Patient presents with  . Hypertension    History of Present Illness:  Julian Weaver is a 71 y.o. male with history of CAD status post PCI/DES RCA in 2010, hypertension, hyperlipidemia, tobacco abuse, carotid bruits 1-39% dopplers 03/2015. Last saw Dr. Mayford Knife 03/2016.  Patient comes in today because his BP has been running high. 140s/86. Primary care and his wife are both concerned. He does not eat out watches his salt closely. He still works [employer] and is up and down ladders and doing heavy work during the day without difficulty. He denies chest pain, palpitations, dyspnea, dizziness or presyncope. He continues to smoke but says he's going to quit. He has some dyspnea on exertion that is unchanged.    Past Medical History:  Diagnosis Date  . Allergic rhinitis   . Arthritis   . Carotid artery stenosis    1-39% bilateral dopplers 03/2015  . Coronary artery disease 04/13/2008   Drug eluting stent in RCA  . Hypercholesteremia   . Hypertension   . RBBB   . Renal disorder   . Tobacco abuse     Past Surgical History:  Procedure Laterality Date  . CAROTID STENT  2010  . SHOULDER ACROMIOPLASTY  2010    Current Medications: Current Meds  Medication Sig  . amLODipine (NORVASC) 5 MG tablet Take 1 tablet (5 mg total) by mouth daily.  [name] aspirin 81 MG EC tablet Take 1 tablet (81 mg total) by mouth daily.  [name] atorvastatin (LIPITOR) 20 MG tablet Take 1 tablet (20 mg total) by mouth daily.  [name] co-enzyme Q-10 30 MG capsule Take 100 mg by mouth daily.  [name] EPINEPHrine (EPIPEN IJ) Inject as directed as needed (bee stings).   [name] etodolac (LODINE) 400 MG tablet TK 1 T PO BID PRN P     Allergies:   Bee venom   Social History   Social History  . Marital status: Married    Spouse name: N/A  . Number of children:  N/A  . Years of education: N/A   Social History Main Topics  . Smoking status: Current Every Day Smoker    Packs/day: 1.50    Years: 56.00    Start date: 10/27/1982  . Smokeless tobacco: Never Used  . Alcohol use No  . Drug use: No  . Sexual activity: Not Asked   Other Topics Concern  . None   Social History Narrative  . None     Family History:  The patient's family history includes Stroke in his father and mother.   ROS:   Please see the history of present illness.    Review of Systems  Constitution: Negative.  HENT: Negative.   Cardiovascular: Positive for dyspnea on exertion.  Respiratory: Positive for snoring.   Endocrine: Negative.   Hematologic/Lymphatic: Bruises/bleeds easily.  Musculoskeletal: Positive for arthritis, back pain, joint swelling and myalgias.  Gastrointestinal: Negative.   Genitourinary: Negative.   Neurological: Negative.    All other systems reviewed and are negative.   PHYSICAL EXAM:   VS:  BP 120/70 (BP Location: Left Arm, Patient Position: Sitting, Cuff Size: Normal)   Pulse 72   Ht  (1.753 m)   Wt 176 lb (79.8 kg)   SpO2 96%   BMI 25.99 kg/m   Physical Exam  GEN: Well nourished, well developed, in no acute distress  Neck: Bilateral carotid bruits no JVD, or masses Cardiac:RRR; positive S4 no murmurs, rubs Respiratory:  Decreased breath sounds but clear to auscultation bilaterally, normal work of breathing GI: soft, nontender, nondistended, + BS Ext: without cyanosis, clubbing, or edema, Good distal pulses bilaterally Neuro:  Alert and Oriented x 3 Psych: euthymic mood, full affect  Wt Readings from Last 3 Encounters:  12/23/16 176 lb (79.8 kg)  04/01/16 179 lb (81.2 kg)  12/05/15 170 lb (77.1 kg)      Studies/Labs Reviewed:   EKG:  EKG is not ordered today.     Recent Labs: 04/01/2016: ALT 17   Lipid Panel    Component Value Date/Time   CHOL 99 (L) 04/01/2016 1024   TRIG 58 04/01/2016 1024   HDL 40 04/01/2016  1024   CHOLHDL 2.5 04/01/2016 1024   CHOLHDL 2.5 05/24/2015 0816   VLDL 16 05/24/2015 0816   LDLCALC 47 04/01/2016 1024    Additional studies/ records that were reviewed today include:   Notes Recorded by Quintella Reichert, MD on 04/04/2015 at 3:23 PM 1-39% bilateral carotid artery stenosis - repeat study in 2 years           ASSESSMENT:    1. Essential hypertension, benign   2. Atherosclerosis of native coronary artery of native heart without angina pectoris   3. Bilateral carotid artery stenosis   4. Cigarette smoker      PLAN:  In order of problems listed above:  Essential hypertension blood pressure is good today but he brings a list of readings that are moderately high. It was also high when he saw primary care. He follows a low-sodium diet. He is on Norvasc 5 mg once daily. We'll add hydro-Diuril 25 mg once daily. Will check renal function today and in 2 weeks. I'll see him back in 2 weeks.  CAD with remote stenting without angina  Bilateral carotid artery stenosis for repeat Dopplers in January  Cigarette smoking patient says he is going to quit but has not set a date or plan yet.     Medication Adjustments/Labs and Tests Ordered: Current medicines are reviewed at length with the patient today.  Concerns regarding medicines are outlined above.  Medication changes, Labs and Tests ordered today are listed in the Patient Instructions below. There are no Patient Instructions on file for this visit.   Elson Clan, PA-C  12/23/2016 11:00 AM    [facility] [address] [place], [place], [place]  [address] Phone: [phone]; Fax: [phone]

## 2016-12-23 NOTE — Patient Instructions (Signed)
Medication Instructions: Your physician has recommended you make the following change in your medication:  -1) START Hydrochlorothiazide (Hydrodiuril) - Take 1 tablet (25 mg) by mouth daily  Labwork: Your physician recommends that you have lab work today: BMET  Your physician recommends that you return for lab work in: 2 WEEKS for a BMET  Procedures/Testing: None Ordered  Follow-Up: Your physician recommends that you schedule a follow-up appointment in: 2 WEEKS with Jacolyn Reedy, PA   If you need a refill on your cardiac medications before your next appointment, please call your pharmacy.

## 2016-12-23 NOTE — Telephone Encounter (Signed)
Tried to reach pt, per Jacolyn Reedy, PA-C, to let him know that, per Dr. Mayford Knife, he cannot restart Celebrex due to his CAD. I had to leave a message for pt to call back.

## 2016-12-23 NOTE — Progress Notes (Signed)
Can you let patient know that I asked Dr. Mayford Knife about him using Celebrex and because of his history of CAD she doesn't want him on this drug.  Thanks, Elon Jester

## 2016-12-23 NOTE — Telephone Encounter (Signed)
-----   Message from Dyann Kief, PA-C sent at 12/23/2016  3:48 PM EDT -----   ----- Message ----- From: Quintella Reichert, MD Sent: 12/23/2016   2:10 PM To: Dyann Kief, PA-C  He has CAD and would avoid Celebrex  Traci ----- Message ----- From: Collier Bullock Sent: 12/23/2016  11:05 AM To: Quintella Reichert, MD  Dr. Mayford Knife, this patient says you stopped his celebrex in 2010 when he had his stent. He wants me to ask you if he can start taking it again.

## 2016-12-24 NOTE — Telephone Encounter (Signed)
Returned pts call and left a message for him to call back. 

## 2016-12-24 NOTE — Telephone Encounter (Signed)
Follow up  ° ° ° °Pt is returning call to [name]. °

## 2016-12-24 NOTE — Telephone Encounter (Signed)
-----   Message from Dyann Kief, PA-C sent at 12/24/2016  8:00 AM EDT ----- Renal function normal

## 2016-12-24 NOTE — Telephone Encounter (Signed)
Follow up     Pt is returning call to [facility]. He asked for a message to be left because he's at work and can not answer the phone.

## 2016-12-25 ENCOUNTER — Telehealth: Payer: Self-pay | Admitting: Acute Care

## 2016-12-25 DIAGNOSIS — F17210 Nicotine dependence, cigarettes, uncomplicated: Principal | ICD-10-CM

## 2016-12-25 DIAGNOSIS — Z122 Encounter for screening for malignant neoplasm of respiratory organs: Secondary | ICD-10-CM

## 2016-12-25 NOTE — Telephone Encounter (Signed)
Routing to lung nodule pool.  

## 2016-12-25 NOTE — Telephone Encounter (Signed)
Reviewed lab results with patient who verbalized understanding and thanked me for the call 

## 2016-12-25 NOTE — Telephone Encounter (Signed)
Follow up ° ° ° ° °Patient returning call for results. ° ° ° ° °

## 2016-12-26 NOTE — Telephone Encounter (Signed)
Routing to the lung nodule pool.  

## 2016-12-26 NOTE — Telephone Encounter (Signed)
Patient returned phone call, pt contact # [phone], it ok to leave a message because at work.Marland KitchenMarland Kitchen

## 2016-12-26 NOTE — Telephone Encounter (Signed)
Pt informed of CT results per Sarah Groce, NP.  PT verbalized understanding.  Copy sent to PCP.  Order placed for 1 yr f/u CT.  

## 2016-12-26 NOTE — Telephone Encounter (Signed)
LMTC x 1  

## 2017-01-06 ENCOUNTER — Ambulatory Visit (INDEPENDENT_AMBULATORY_CARE_PROVIDER_SITE_OTHER): Payer: Medicare Other | Admitting: Physician Assistant

## 2017-01-06 ENCOUNTER — Other Ambulatory Visit: Payer: Medicare Other

## 2017-01-06 ENCOUNTER — Encounter: Payer: Self-pay | Admitting: Physician Assistant

## 2017-01-06 VITALS — BP 128/72 | HR 74 | Resp 16 | Ht 69.0 in | Wt 176.8 lb

## 2017-01-06 DIAGNOSIS — I2510 Atherosclerotic heart disease of native coronary artery without angina pectoris: Secondary | ICD-10-CM | POA: Diagnosis not present

## 2017-01-06 DIAGNOSIS — I6523 Occlusion and stenosis of bilateral carotid arteries: Secondary | ICD-10-CM | POA: Diagnosis not present

## 2017-01-06 DIAGNOSIS — F17210 Nicotine dependence, cigarettes, uncomplicated: Secondary | ICD-10-CM

## 2017-01-06 DIAGNOSIS — I10 Essential (primary) hypertension: Secondary | ICD-10-CM
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
## 2017-01-06 NOTE — Addendum Note (Signed)
Addended by: Lendon Ka on: 01/06/2017 11:58 AM   Modules accepted: Orders

## 2017-01-06 NOTE — Patient Instructions (Addendum)
Your physician recommends that you continue on your current medications as directed. Please refer to the Current Medication list given to you today.  Your physician recommends that you schedule a follow-up appointment in: about 1 month after starting hydrodiuril.  No lab work today.    Your physician has requested that you have an echocardiogram. Echocardiography is a painless test that uses sound waves to create images of your heart. It provides your doctor with information about the size and shape of your heart and how well your heart's chambers and valves are working. This procedure takes approximately one hour. There are no restrictions for this procedure.  Plan on seeing Dr. Mayford Knife in January, will will be calling or sending you a letter to make this appointment.

## 2017-01-08 ENCOUNTER — Telehealth (HOSPITAL_COMMUNITY): Payer: Self-pay | Admitting: Physician Assistant

## 2017-01-08 ENCOUNTER — Telehealth: Payer: Self-pay | Admitting: Physician Assistant

## 2017-01-08 ENCOUNTER — Telehealth: Payer: Self-pay | Admitting: *Deleted

## 2017-01-08 DIAGNOSIS — Z79899 Other long term (current) drug therapy: Secondary | ICD-10-CM

## 2017-01-08 NOTE — Telephone Encounter (Signed)
Lm for pt to keep B/p log and call with readings next week ./cy

## 2017-01-08 NOTE — Telephone Encounter (Signed)
Echo was ordered yesterday at ov.  This was not on AVS and patient did not schedule.  Called yesterday and left message to call back.  Called back today and left detailed message and that someone will be calling to make this appointment.  Message [facility] at this time.

## 2017-01-08 NOTE — Telephone Encounter (Signed)
Per pt had taken hctz yesterday and within few hours started sweating  ,dizzy,vommitted x 2 and tightness to chest has not taken any more.Will forward to Herma CarsonMichelle Lenze for review ./cy

## 2017-01-08 NOTE — Telephone Encounter (Signed)
Tell patient to keep track of BP's off HCTZ and let us know how they are

## 2017-01-08 NOTE — Telephone Encounter (Signed)
Pt calling to say he can not take Hydrodiuril given to him last visit, it made him vomit, light headed, had to leave work-pls advise

## 2017-01-09 NOTE — Telephone Encounter (Signed)
User: Julian Weaver, [name] A Date/time: 01/08/17 3:37 PM  Comment: Called pt and lmsg for him to CB to get scheduled for an echo.   Context:  Outcome: Left Message  Phone number: [phone] Phone Type: Home Phone  Comm. type: Telephone Call type: Outgoing  Contact: Park Literichardson, [name] Relation to patient: Self

## 2017-01-13 ENCOUNTER — Other Ambulatory Visit: Payer: Self-pay

## 2017-01-13 ENCOUNTER — Ambulatory Visit (HOSPITAL_COMMUNITY): Payer: Medicare Other | Attending: Cardiology

## 2017-01-13 DIAGNOSIS — I7789 Other specified disorders of arteries and arterioles: Secondary | ICD-10-CM | POA: Diagnosis not present

## 2017-01-13 DIAGNOSIS — I10 Essential (primary) hypertension: Secondary | ICD-10-CM

## 2017-01-13 DIAGNOSIS — I4510 Unspecified right bundle-branch block: Secondary | ICD-10-CM | POA: Insufficient documentation

## 2017-01-13 DIAGNOSIS — Z720 Tobacco use: Secondary | ICD-10-CM | POA: Diagnosis not present

## 2017-01-13 DIAGNOSIS — I2510 Atherosclerotic heart disease of native coronary artery without angina pectoris: Secondary | ICD-10-CM | POA: Insufficient documentation

## 2017-01-13 DIAGNOSIS — E7800 Pure hypercholesterolemia, unspecified: Secondary | ICD-10-CM | POA: Insufficient documentation

## 2017-01-14 ENCOUNTER — Telehealth: Payer: Self-pay | Admitting: *Deleted

## 2017-01-14 NOTE — Telephone Encounter (Signed)
Follow Up:    Returning your call, concerning his Echo results. 

## 2017-01-14 NOTE — Telephone Encounter (Signed)
Pt has been notified of echo results by phone with verbal understanding. Pt thanked me for my call.   

## 2017-01-14 NOTE — Telephone Encounter (Signed)
-----   Message from Dyann KiefMichele M Lenze, PA-C sent at 01/14/2017  7:56 AM EDT ----- Normal heart function with a little bit of thickening from HTN but overall looks good

## 2017-01-17 NOTE — Telephone Encounter (Signed)
PER PT B/P RUNNING 138-147/50-70 WILL FORWARD TO MICHELE LENZE PA FOR REVIEW .Zack Seal/CY

## 2017-01-20 MED ORDER — LISINOPRIL 5 MG PO TABS: 5.0000 mg | ORAL_TABLET | Freq: Every day | ORAL | 3 refills | Status: DC

## 2017-01-20 NOTE — Addendum Note (Signed)
Addended by: Scherrie BatemanYORK, Rella Egelston E on: 01/20/2017 03:52 PM   Modules accepted: Orders

## 2017-01-20 NOTE — Telephone Encounter (Signed)
BP on high side. Could try low dose lisinopril 5 mg daily. BMET 2 weeks after starting

## 2017-01-20 NOTE — Telephone Encounter (Signed)
PT AWARE IS WILLING TO TRY NEW AND HAVE BMET CHECKED IN 2 WEEKS ./CY

## 2017-01-27 ENCOUNTER — Other Ambulatory Visit: Payer: Medicare Other | Admitting: *Deleted

## 2017-01-27 DIAGNOSIS — Z79899 Other long term (current) drug therapy: Secondary | ICD-10-CM

## 2017-01-28 ENCOUNTER — Telehealth: Payer: Self-pay

## 2017-01-28 LAB — BASIC METABOLIC PANEL
BUN/Creatinine Ratio: 17 (ref 10–24)
BUN: 15 mg/dL (ref 8–27)
CALCIUM: 8.7 mg/dL (ref 8.6–10.2)
CHLORIDE: 102 mmol/L (ref 96–106)
CO2: 25 mmol/L (ref 20–29)
Creatinine, Ser: 0.87 mg/dL (ref 0.76–1.27)
GFR calc Af Amer: 102 mL/min/{1.73_m2} (ref >59)
GFR calc non Af Amer: 88 mL/min/{1.73_m2} (ref >59)
GLUCOSE: 105 mg/dL — AB (ref 65–99)
Potassium: 3.7 mmol/L (ref 3.5–5.2)
Sodium: 141 mmol/L (ref 134–144)

## 2017-01-28 NOTE — Telephone Encounter (Signed)
patient returned my phone call. spoke with patient about recent lab results. patient verbalized understanding. patient thanked me for my call.

## 2017-01-28 NOTE — Telephone Encounter (Signed)
-----   Message from Jacqlyn KraussAnne M Lankford, RN sent at 01/28/2017  7:50 AM EST ----- To CMA [employer]

## 2017-01-30 DIAGNOSIS — J069 Acute upper respiratory infection, unspecified: Secondary | ICD-10-CM | POA: Diagnosis not present

## 2017-01-30 DIAGNOSIS — F17200 Nicotine dependence, unspecified, uncomplicated: Secondary | ICD-10-CM | POA: Diagnosis not present

## 2017-01-30 DIAGNOSIS — R05 Cough: Secondary | ICD-10-CM | POA: Diagnosis not present

## 2017-03-28 ENCOUNTER — Other Ambulatory Visit: Payer: Self-pay | Admitting: Cardiology

## 2017-04-17 NOTE — Progress Notes (Signed)
Cardiology Office Note:    Date:  04/18/2017   ID:  Julian Julian, DOB 11-24-1945, MRN [id number]  PCP:  Julian Pollack, MD  Cardiologist:  No primary care provider on file.    Referring MD: Julian Pollack, MD   Chief Complaint  Patient presents with  . Coronary Artery Disease  . Hypertension  . Hyperlipidemia    History of Present Illness:    Julian Weaver is a 72 y.o. male with a hx of CAD status post PCI/DESRCA in 2010, hypertension, hyperlipidemia, tobacco abuse, carotid bruits 1-39% dopplers 03/2015.  He is here today for followup and is doing well.  He denies any chest pain or pressure, SOB, DOE, PND, orthopnea, LE edema, dizziness (except when getting up too fast), palpitations or syncope. He is compliant with his meds and is tolerating meds with no SE.    Past Medical History:  Diagnosis Date  . Allergic rhinitis   . Arthritis   . Carotid artery stenosis    1-39% bilateral dopplers 03/2015  . Coronary artery disease 04/13/2008   Drug eluting stent in RCA  . Hypercholesteremia   . Hypertension   . RBBB   . Renal disorder   . Tobacco abuse     Past Surgical History:  Procedure Laterality Date  . CAROTID STENT  2010  . SHOULDER ACROMIOPLASTY  2010    Current Medications: Current Meds  Medication Sig  . amLODipine (NORVASC) 5 MG tablet Take 1 tablet (5 mg total) by mouth daily.  [name] aspirin 81 MG EC tablet Take 1 tablet (81 mg total) by mouth daily.  [name] atorvastatin (LIPITOR) 20 MG tablet TAKE 1 TABLET(20 MG) BY MOUTH DAILY  . co-enzyme Q-10 30 MG capsule Take 100 mg by mouth daily.  [name] EPINEPHrine (EPIPEN IJ) Inject as directed as needed (bee stings).   [name] etodolac (LODINE) 400 MG tablet TK 1 T PO BID PRN P  . [DISCONTINUED] amLODipine (NORVASC) 5 MG tablet Take 1 tablet (5 mg total) by mouth daily.  . [DISCONTINUED] atorvastatin (LIPITOR) 20 MG tablet TAKE 1 TABLET(20 MG) BY MOUTH DAILY     Allergies:   Bee venom   Social History   Socioeconomic History   . Marital status: Married    Spouse name: None  . Number of children: None  . Years of education: None  . Highest education level: None  Social Needs  . Financial resource strain: None  . Food insecurity - worry: None  . Food insecurity - inability: None  . Transportation needs - medical: None  . Transportation needs - non-medical: None  Occupational History  . None  Tobacco Use  . Smoking status: Current Every Day Smoker    Packs/day: 1.50    Years: 56.00    Pack years: 84.00    Start date: 10/27/1982  . Smokeless tobacco: Never Used  Substance and Sexual Activity  . Alcohol use: No    Alcohol/week: 0.0 oz  . Drug use: No  . Sexual activity: None  Other Topics Concern  . None  Social History Narrative  . None     Family History: The patient's family history includes Stroke in his father and mother.  ROS:   Please see the history of present illness.    Review of Systems  Musculoskeletal: Positive for muscle cramps.    All other systems reviewed and negative.   EKGs/Labs/Other Studies Reviewed:    The following studies were reviewed today: none  EKG:  EKG is  not ordered today.    Recent Labs: 01/27/2017: BUN 15; Creatinine, Ser 0.87; Potassium 3.7; Sodium 141   Recent Lipid Panel    Component Value Date/Time   CHOL 99 (L) 04/01/2016 1024   TRIG 58 04/01/2016 1024   HDL 40 04/01/2016 1024   CHOLHDL 2.5 04/01/2016 1024   CHOLHDL 2.5 05/24/2015 0816   VLDL 16 05/24/2015 0816   LDLCALC 47 04/01/2016 1024    Physical Exam:    VS:  BP 118/64   Pulse 74   Ht 5\' 9"  (1.753 m)   Wt 173 lb 9.6 oz (78.7 kg)   SpO2 95%   BMI 25.64 kg/m     Wt Readings from Last 3 Encounters:  04/18/17 173 lb 9.6 oz (78.7 kg)  01/06/17 176 lb 12.8 oz (80.2 kg)  12/23/16 176 lb (79.8 kg)     GEN:  Well nourished, well developed in no acute distress HEENT: Normal NECK: No JVD; No carotid bruits LYMPHATICS: No lymphadenopathy CARDIAC: RRR, no murmurs, rubs,  gallops RESPIRATORY:  Clear to auscultation without rales, wheezing or rhonchi  ABDOMEN: Soft, non-tender, non-distended MUSCULOSKELETAL:  No edema; No deformity  SKIN: Warm and dry NEUROLOGIC:  Alert and oriented x 3 PSYCHIATRIC:  Normal affect   ASSESSMENT:    1. Atherosclerosis of native coronary artery of native heart without angina pectoris   2. Essential hypertension, benign   3. Bilateral carotid artery stenosis   4. Pure hypercholesterolemia    PLAN:    In order of problems listed above:  1.  ASCAD - s/p PCI of RCA.  He denies any anginal symptoms.  He will continue on ASA and statin.  2.  HTN - BP well controlled on exam today.  He will continue on amlodipine 5mg  daily and Lisinopril 5mg  daily. Creatinine stable at 0.87 in 01/2017.    3.  Carotid artery stenosis 1-39% by dopplers 03/2015.  Repeat dopplers for progression of disease. He wll continue on statin and ASA.  4.  Hyperlipidemia with LDL goal < 70.  He will continue on atorvastatin 20mg  daily.  I will repeat FLP and ALT.   Medication Adjustments/Labs and Tests Ordered: Current medicines are reviewed at length with the patient today.  Concerns regarding medicines are outlined above.  No orders of the defined types were placed in this encounter.  Meds ordered this encounter  Medications  . atorvastatin (LIPITOR) 20 MG tablet    Sig: TAKE 1 TABLET(20 MG) BY MOUTH DAILY    Dispense:  90 tablet    Refill:  2  . amLODipine (NORVASC) 5 MG tablet    Sig: Take 1 tablet (5 mg total) by mouth daily.    Dispense:  90 tablet    Refill:  3    Signed, Armanda Magicraci Butler Vegh, MD  04/18/2017 8:08 AM    [facility]

## 2017-04-18 ENCOUNTER — Ambulatory Visit (INDEPENDENT_AMBULATORY_CARE_PROVIDER_SITE_OTHER): Payer: Medicare Other | Admitting: Cardiology

## 2017-04-18 ENCOUNTER — Encounter: Payer: Self-pay | Admitting: Cardiology

## 2017-04-18 VITALS — BP 118/64 | HR 74 | Ht 69.0 in | Wt 173.6 lb

## 2017-04-18 DIAGNOSIS — I6523 Occlusion and stenosis of bilateral carotid arteries: Secondary | ICD-10-CM

## 2017-04-18 DIAGNOSIS — E7800 Pure hypercholesterolemia, unspecified: Secondary | ICD-10-CM

## 2017-04-18 DIAGNOSIS — I10 Essential (primary) hypertension: Secondary | ICD-10-CM | POA: Diagnosis not present

## 2017-04-18 DIAGNOSIS — I2510 Atherosclerotic heart disease of native coronary artery without angina pectoris: Secondary | ICD-10-CM | POA: Diagnosis not present

## 2017-04-18 LAB — LIPID PANEL
CHOL/HDL RATIO: 2.4 ratio (ref 0.0–5.0)
Cholesterol, Total: 115 mg/dL (ref 100–199)
HDL: 48 mg/dL (ref >39)
LDL Calculated: 52 mg/dL (ref 0–99)
TRIGLYCERIDES: 75 mg/dL (ref 0–149)
VLDL CHOLESTEROL CAL: 15 mg/dL (ref 5–40)

## 2017-04-18 LAB — HEPATIC FUNCTION PANEL
ALBUMIN: 4.4 g/dL (ref 3.6–4.8)
ALT: 17 IU/L (ref 0–44)
AST: 17 IU/L (ref 0–40)
Alkaline Phosphatase: 132 IU/L — ABNORMAL HIGH (ref 39–117)
BILIRUBIN TOTAL: 0.2 mg/dL (ref 0.0–1.2)
Bilirubin, Direct: 0.09 mg/dL (ref 0.00–0.40)
TOTAL PROTEIN: 7 g/dL (ref 6.0–8.5)

## 2017-04-18 MED ORDER — AMLODIPINE BESYLATE 5 MG PO TABS: 5.0000 mg | ORAL_TABLET | Freq: Every day | ORAL | 3 refills | Status: DC

## 2017-04-18 MED ORDER — ATORVASTATIN CALCIUM 20 MG PO TABS: ORAL_TABLET | ORAL | 2 refills | Status: DC

## 2017-04-18 NOTE — Patient Instructions (Signed)
Medication Instructions:  Your physician recommends that you continue on your current medications as directed. Please refer to the Current Medication list given to you today.  Labwork: Today for liver function, and fasting lipids  Testing/Procedures: Your physician has requested that you have a carotid duplex. This test is an ultrasound of the carotid arteries in your neck. It looks at blood flow through these arteries that supply the brain with blood. Allow one hour for this exam. There are no restrictions or special instructions.  Follow-Up: Your physician wants you to follow-up in: 1 year with Dr. Mayford Knifeurner. You will receive a reminder letter in the mail two months in advance. If you don't receive a letter, please call our office to schedule the follow-up appointment.  Any Other Special Instructions Will Be Listed Below (If Applicable).     If you need a refill on your cardiac medications before your next appointment, please call your pharmacy.

## 2017-04-23 DIAGNOSIS — R748 Abnormal levels of other serum enzymes: Secondary | ICD-10-CM | POA: Diagnosis not present

## 2017-04-23 DIAGNOSIS — J309 Allergic rhinitis, unspecified: Secondary | ICD-10-CM | POA: Diagnosis not present

## 2017-04-23 DIAGNOSIS — R110 Nausea: Secondary | ICD-10-CM | POA: Diagnosis not present

## 2017-04-29 ENCOUNTER — Encounter: Payer: Self-pay | Admitting: Cardiology

## 2017-04-29 ENCOUNTER — Ambulatory Visit (HOSPITAL_COMMUNITY)
Admission: RE | Admit: 2017-04-29 | Discharge: 2017-04-29 | Disposition: A | Discharge status: Home / Self Care | Payer: Medicare Other | Source: Ambulatory Visit | Attending: Cardiology | Admitting: Cardiology

## 2017-04-29 DIAGNOSIS — I6523 Occlusion and stenosis of bilateral carotid arteries: Secondary | ICD-10-CM | POA: Insufficient documentation

## 2017-05-23 DIAGNOSIS — R748 Abnormal levels of other serum enzymes: Secondary | ICD-10-CM | POA: Diagnosis not present

## 2017-05-28 ENCOUNTER — Telehealth: Payer: Self-pay | Admitting: Cardiology

## 2017-05-28 NOTE — Telephone Encounter (Signed)
Follow Up:    Returning your call,cocnerning his lab results. If he is not there leave a message.

## 2017-05-28 NOTE — Telephone Encounter (Signed)
Returned call to patient. Per patient's request left detailed message with normal lab results.

## 2017-06-24 ENCOUNTER — Other Ambulatory Visit: Payer: Self-pay | Admitting: Physician Assistant

## 2017-07-15 ENCOUNTER — Ambulatory Visit (INDEPENDENT_AMBULATORY_CARE_PROVIDER_SITE_OTHER): Payer: Medicare Other

## 2017-07-15 ENCOUNTER — Other Ambulatory Visit: Payer: Self-pay | Admitting: Sports Medicine

## 2017-07-15 ENCOUNTER — Encounter: Payer: Self-pay | Admitting: Sports Medicine

## 2017-07-15 ENCOUNTER — Ambulatory Visit (INDEPENDENT_AMBULATORY_CARE_PROVIDER_SITE_OTHER): Payer: Medicare Other | Admitting: Sports Medicine

## 2017-07-15 DIAGNOSIS — M1000 Idiopathic gout, unspecified site: Secondary | ICD-10-CM

## 2017-07-15 DIAGNOSIS — I6523 Occlusion and stenosis of bilateral carotid arteries: Secondary | ICD-10-CM

## 2017-07-15 DIAGNOSIS — M7751 Other enthesopathy of right foot: Secondary | ICD-10-CM

## 2017-07-15 DIAGNOSIS — M722 Plantar fascial fibromatosis: Secondary | ICD-10-CM | POA: Diagnosis not present

## 2017-07-15 DIAGNOSIS — M25571 Pain in right ankle and joints of right foot: Secondary | ICD-10-CM | POA: Diagnosis not present

## 2017-07-15 LAB — CBC WITH DIFFERENTIAL/PLATELET
Basophils Absolute: 37 cells/uL (ref 0–200)
Basophils Relative: 0.4 %
Eosinophils Absolute: 344 cells/uL (ref 15–500)
Eosinophils Relative: 3.7 %
HCT: 43.3 % (ref 38.5–50.0)
Hemoglobin: 15.2 g/dL (ref 13.2–17.1)
Lymphs Abs: 2102 cells/uL (ref 850–3900)
MCH: 31.9 pg (ref 27.0–33.0)
MCHC: 35.1 g/dL (ref 32.0–36.0)
MCV: 90.8 fL (ref 80.0–100.0)
MONOS PCT: 9 %
MPV: 10.6 fL (ref 7.5–12.5)
NEUTROS PCT: 64.3 %
Neutro Abs: 5980 cells/uL (ref 1500–7800)
PLATELETS: 225 10*3/uL (ref 140–400)
RBC: 4.77 10*6/uL (ref 4.20–5.80)
RDW: 13 % (ref 11.0–15.0)
TOTAL LYMPHOCYTE: 22.6 %
WBC mixed population: 837 cells/uL (ref 200–950)
WBC: 9.3 10*3/uL (ref 3.8–10.8)

## 2017-07-15 LAB — URIC ACID: URIC ACID, SERUM: 4.2 mg/dL (ref 4.0–8.0)

## 2017-07-15 LAB — SEDIMENTATION RATE: SED RATE: 6 mm/h (ref 0–20)

## 2017-07-15 MED ORDER — TRIAMCINOLONE ACETONIDE 10 MG/ML IJ SUSP: 10.0000 mg | Freq: Once | INTRAMUSCULAR | Status: AC

## 2017-07-15 MED ORDER — METHYLPREDNISOLONE 4 MG PO TBPK: ORAL_TABLET | ORAL | 0 refills | Status: DC

## 2017-07-15 NOTE — Progress Notes (Signed)
Subjective:  Geri SeminoleJames C Wigfall is a 72 y.o. male patient who presents to office for evaluation of right ankle pain and occasional 1st toe pain. Patient complains of continued pain in the ankle. Patient has tried rest and work boots with no relief in symptoms. Patient denies any other pedal complaints. Denies injury/trip/fall/sprain/any causative factors. Admits that pain has been waking him at night and feels unsteady with redness and swelling and burning. Denies nausea, vomiting, chills, or any other acute symptoms.   Patient Active Problem List   Diagnosis Date Noted  . Chronic pain of left knee 05/15/2016  . Swelling of left knee joint 05/15/2016  . Carotid artery bruit 04/01/2016  . Carotid artery stenosis   . Cigarette smoker 10/27/2014  . 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 Left inguinal hernia 11/12/2013  . Coronary atherosclerosis of native coronary artery 03/11/2013  . Pure hypercholesterolemia 03/11/2013  . Essential hypertension, benign 03/11/2013  . Encounter for long-term (current) use of other medications 03/11/2013  . Bruit 03/11/2013    Current Outpatient Medications on File Prior to Visit  Medication Sig Dispense Refill  . amLODipine (NORVASC) 5 MG tablet Take 1 tablet (5 mg total) by mouth daily. 90 tablet 3  . aspirin 81 MG EC tablet Take 1 tablet (81 mg total) by mouth daily.    [name]. atorvastatin (LIPITOR) 20 MG tablet TAKE 1 TABLET(20 MG) BY MOUTH DAILY 90 tablet 2  . co-enzyme Q-10 30 MG capsule Take 100 mg by mouth daily.    [name]. EPINEPHrine (EPIPEN IJ) Inject as directed as needed (bee stings).     [name]. etodolac (LODINE) 400 MG tablet TK 1 T PO BID PRN P  1   No current facility-administered medications on file prior to visit.     Allergies  Allergen Reactions  . Bee Venom Swelling    Objective:  General: Alert and oriented x3 in no acute distress  Dermatology:Old surgical scars healed, No open lesions bilateral lower extremities, no  webspace macerations, no ecchymosis bilateral, all nails well manicured.  Vascular: Dorsalis Pedis and Posterior Tibial pedal pulses palpable, Capillary Fill Time 3 seconds,(+) scant pedal hair growth bilateral, no edema bilateral lower extremities, Temperature gradient within normal limits.  Neurology: Michaell CowingGross sensation intact via light touch bilateral.   Musculoskeletal: Mild tenderness with palpation at Right dorsal medial ankle, Negative talar tilt, Negative tib-fib stress, No instability. No pain with calf compression bilateral. Range of motion within normal limits with mild guarding on right ankle. Strength within normal limits in all groups bilateral. 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   Assessment and Plan: Problem List Items Addressed This Visit    None    Visit Diagnoses    Acute right ankle pain    -  Primary   Relevant Orders   ANA   C-reactive protein   Rheumatoid factor   Sedimentation rate   Uric acid   CBC with Differential   Capsulitis of right ankle       Relevant Medications   methylPREDNISolone (MEDROL DOSEPAK) 4 MG TBPK tablet   triamcinolone acetonide (KENALOG) 10 MG/ML injection 10 mg   Acute idiopathic gout, unspecified site         -Complete examination performed -Xrays reviewed -Discussed treatement options for capsulitis vs gout  -  Rx Medrol dose pack -After oral consent and aseptic prep, injected a mixture containing 1 ml of 2%  plain lidocaine, 1 ml 0.5% plain marcaine, 0.5 ml of kenalog 10 and 0.5 ml of dexamethasone phosphate into right ankle without complication. Post-injection care discussed with patient.  -Rx Arthritic panel; WILL CALL WITH RESULTS -Recommend rest, protection, ice, elevation  -Patient to return to office as needed or sooner if  condition worsens.  Asencion Islam, DPM

## 2017-07-16 LAB — RHEUMATOID FACTOR: Rhuematoid fact SerPl-aCnc: <14 IU/mL (ref <14)

## 2017-07-16 LAB — C-REACTIVE PROTEIN: CRP: 2.7 mg/L (ref <8.0)

## 2017-07-17 LAB — ANA: Anti Nuclear Antibody(ANA): NEGATIVE

## 2017-07-22 ENCOUNTER — Telehealth: Payer: Self-pay | Admitting: Sports Medicine

## 2017-07-22 ENCOUNTER — Telehealth: Payer: Self-pay | Admitting: *Deleted

## 2017-07-22 NOTE — Telephone Encounter (Signed)
I'm a pt of Dr. Wynema Birch and I'm wanting to know if the results of my blood work have come in. Please call me back at [phone]. Thank you.

## 2017-07-22 NOTE — Telephone Encounter (Signed)
Left message informing pt of Dr. Stover's review of results. 

## 2017-07-22 NOTE — Telephone Encounter (Signed)
Left message informing pt Dr.Stover stated his arthritis panel was negative for gout or any inflammatory joint process, but could still be contributed to tendonitis or degenerative/osteoarthritis, to continue the steroid to completion and if problem continues, worsens or recurs contact our office.

## 2017-07-22 NOTE — Telephone Encounter (Signed)
-----   Message from Asencion Islam, [place] sent at 07/17/2017  1:08 PM EDT ----- Can you let patient know that his Arthritic panel was negative. No acute gout or inflammatory joint condition. His pain still however can be from tendonitis or degenerative/osteoarthritis. He should continue with his steroid by mouth and call office if he has any problems or if the pain worsens or recurs. -Dr. Marylene Land

## 2017-09-15 DIAGNOSIS — S61012A Laceration without foreign body of left thumb without damage to nail, initial encounter: Secondary | ICD-10-CM | POA: Diagnosis not present

## 2017-09-17 DIAGNOSIS — S61012D Laceration without foreign body of left thumb without damage to nail, subsequent encounter: Secondary | ICD-10-CM | POA: Diagnosis not present

## 2017-09-19 DIAGNOSIS — S61012D Laceration without foreign body of left thumb without damage to nail, subsequent encounter: Secondary | ICD-10-CM | POA: Diagnosis not present

## 2017-10-03 DIAGNOSIS — H6121 Impacted cerumen, right ear: Secondary | ICD-10-CM | POA: Diagnosis not present

## 2017-10-03 DIAGNOSIS — H9201 Otalgia, right ear: Secondary | ICD-10-CM | POA: Diagnosis not present

## 2017-12-10 DIAGNOSIS — K219 Gastro-esophageal reflux disease without esophagitis: Secondary | ICD-10-CM | POA: Diagnosis not present

## 2017-12-10 DIAGNOSIS — M1991 Primary osteoarthritis, unspecified site: Secondary | ICD-10-CM | POA: Diagnosis not present

## 2017-12-10 DIAGNOSIS — R1310 Dysphagia, unspecified: Secondary | ICD-10-CM | POA: Diagnosis not present

## 2017-12-10 DIAGNOSIS — F17200 Nicotine dependence, unspecified, uncomplicated: Secondary | ICD-10-CM | POA: Diagnosis not present

## 2017-12-12 ENCOUNTER — Other Ambulatory Visit: Payer: Self-pay | Admitting: Family Medicine

## 2017-12-12 DIAGNOSIS — R1310 Dysphagia, unspecified: Secondary | ICD-10-CM

## 2017-12-19 ENCOUNTER — Ambulatory Visit
Admission: RE | Admit: 2017-12-19 | Discharge: 2017-12-19 | Disposition: A | Discharge status: Home / Self Care | Payer: Medicare Other | Source: Ambulatory Visit | Attending: Family Medicine | Admitting: Family Medicine

## 2017-12-19 DIAGNOSIS — R1310 Dysphagia, unspecified: Secondary | ICD-10-CM | POA: Diagnosis not present

## 2017-12-29 DIAGNOSIS — Z23 Encounter for immunization: Secondary | ICD-10-CM | POA: Diagnosis not present

## 2017-12-29 DIAGNOSIS — M1991 Primary osteoarthritis, unspecified site: Secondary | ICD-10-CM | POA: Diagnosis not present

## 2017-12-29 DIAGNOSIS — E7800 Pure hypercholesterolemia, unspecified: Secondary | ICD-10-CM | POA: Diagnosis not present

## 2017-12-29 DIAGNOSIS — Z125 Encounter for screening for malignant neoplasm of prostate: Secondary | ICD-10-CM | POA: Diagnosis not present

## 2017-12-29 DIAGNOSIS — H9193 Unspecified hearing loss, bilateral: Secondary | ICD-10-CM | POA: Diagnosis not present

## 2017-12-29 DIAGNOSIS — K219 Gastro-esophageal reflux disease without esophagitis: Secondary | ICD-10-CM | POA: Diagnosis not present

## 2017-12-29 DIAGNOSIS — Z0000 Encounter for general adult medical examination without abnormal findings: Secondary | ICD-10-CM | POA: Diagnosis not present

## 2017-12-29 DIAGNOSIS — I700 Atherosclerosis of aorta: Secondary | ICD-10-CM | POA: Diagnosis not present

## 2017-12-29 DIAGNOSIS — F17200 Nicotine dependence, unspecified, uncomplicated: Secondary | ICD-10-CM | POA: Diagnosis not present

## 2017-12-29 DIAGNOSIS — Z1211 Encounter for screening for malignant neoplasm of colon: Secondary | ICD-10-CM | POA: Diagnosis not present

## 2017-12-29 DIAGNOSIS — I10 Essential (primary) hypertension: Secondary | ICD-10-CM | POA: Diagnosis not present

## 2017-12-29 DIAGNOSIS — J449 Chronic obstructive pulmonary disease, unspecified: Secondary | ICD-10-CM | POA: Diagnosis not present

## 2018-01-30 ENCOUNTER — Other Ambulatory Visit: Payer: Self-pay | Admitting: Physician Assistant

## 2018-02-24 DIAGNOSIS — H6123 Impacted cerumen, bilateral: Secondary | ICD-10-CM | POA: Diagnosis not present

## 2018-02-24 DIAGNOSIS — H903 Sensorineural hearing loss, bilateral: Secondary | ICD-10-CM | POA: Diagnosis not present

## 2018-03-27 ENCOUNTER — Ambulatory Visit (INDEPENDENT_AMBULATORY_CARE_PROVIDER_SITE_OTHER): Payer: Medicare Other | Admitting: Podiatry

## 2018-03-27 ENCOUNTER — Encounter: Payer: Self-pay | Admitting: Podiatry

## 2018-03-27 DIAGNOSIS — L600 Ingrowing nail: Secondary | ICD-10-CM | POA: Diagnosis not present

## 2018-03-27 MED ORDER — NEOMYCIN-POLYMYXIN-HC 3.5-10000-1 OT SOLN: OTIC | 0 refills | Status: DC

## 2018-03-27 NOTE — Progress Notes (Signed)
Subjective:  Patient ID: Julian Weaver, male    DOB: Sep 28, 1945,  MRN: [id number]  Chief Complaint  Patient presents with  . Toe Pain    Hallux right - medial border, throbbing x 3-4 weeks, red and swollen, tried trimming out-no help    73 y.o. male presents with the above complaint.    Review of Systems: Negative except as noted in the HPI. Denies N/V/F/Ch.  Past Medical History:  Diagnosis Date  . Allergic rhinitis   . Arthritis   . Carotid artery stenosis    1-39% bilateral dopplers 04/2017  . Coronary artery disease 04/13/2008   Drug eluting stent in RCA  . Hypercholesteremia   . Hypertension   . RBBB   . Renal disorder   . Tobacco abuse     Current Outpatient Medications:  .  omeprazole (PRILOSEC) 20 MG capsule, Take 20 mg by mouth daily., Disp: , Rfl:  .  amLODipine (NORVASC) 5 MG tablet, Take 1 tablet (5 mg total) by mouth daily., Disp: 90 tablet, Rfl: 3 .  aspirin 81 MG EC tablet, Take 1 tablet (81 mg total) by mouth daily., Disp: , Rfl:  .  atorvastatin (LIPITOR) 20 MG tablet, TAKE 1 TABLET(20 MG) BY MOUTH DAILY, Disp: 90 tablet, Rfl: 2 .  co-enzyme Q-10 30 MG capsule, Take 100 mg by mouth daily., Disp: , Rfl:  .  EPINEPHrine (EPIPEN IJ), Inject as directed as needed (bee stings). , Disp: , Rfl:  .  etodolac (LODINE) 400 MG tablet, TK 1 T PO BID PRN P, Disp: , Rfl: 1 .  neomycin-polymyxin-hydrocortisone (CORTISPORIN) OTIC solution, Apply 2 drops to the ingrown toenail site twice daily. Cover with band-aid., Disp: 10 mL, Rfl: 0  Current Facility-Administered Medications:  .  triamcinolone acetonide (KENALOG) 10 MG/ML injection 10 mg, 10 mg, Other, Once, Asencion Islam, DPM  Social History   Tobacco Use  Smoking Status Current Every Day Smoker  . Packs/day: 1.50  . Years: 56.00  . Pack years: 84.00  . Start date: 10/27/1982  Smokeless Tobacco Never Used    Allergies  Allergen Reactions  . Bee Venom Swelling   Objective:  There were no vitals filed  for this visit. There is no height or weight on file to calculate BMI. Constitutional Well developed. Well nourished.  Vascular Dorsalis pedis pulses palpable bilaterally. Posterior tibial pulses palpable bilaterally. Capillary refill normal to all digits.  No cyanosis or clubbing noted. Pedal hair growth normal.  Neurologic Normal speech. Oriented to person, place, and time. Epicritic sensation to light touch grossly present bilaterally.  Dermatologic Painful ingrowing nail at medial nail borders of the hallux nail right. No other open wounds. No skin lesions.  Orthopedic: Normal joint ROM without pain or crepitus bilaterally. No visible deformities. No bony tenderness.   Radiographs: None Assessment:   1. Ingrown right big toenail    Plan:  Patient was evaluated and treated and all questions answered.  Ingrown Nail, right -Patient elects to proceed with minor surgery to remove ingrown toenail removal today. Consent reviewed and signed by patient. -Ingrown nail excised. See procedure note. -Educated on post-procedure care including soaking. Written instructions provided and reviewed. -Patient to follow up in 2 weeks for nail check.  Procedure: Excision of Ingrown Toenail Location: Right 1st medial nail borders. Anesthesia: Lidocaine 1% plain; 1.5 mL and Marcaine 0.5% plain; 1.5 mL, digital block. Skin Prep: Betadine. Dressing: Silvadene; telfa; dry, sterile, compression dressing. Technique: Following skin prep, the toe was exsanguinated and a  tourniquet was secured at the base of the toe. The affected nail border was freed, split with a nail splitter, and excised. Chemical matrixectomy was then performed with phenol and irrigated out with alcohol. The tourniquet was then removed and sterile dressing applied. Disposition: Patient tolerated procedure well. Patient to return in 2 weeks for follow-up.   Return in about 2 weeks (around 04/10/2018) for nail check, Nail Check with  Nurse.

## 2018-03-27 NOTE — Patient Instructions (Signed)

## 2018-04-10 ENCOUNTER — Ambulatory Visit (INDEPENDENT_AMBULATORY_CARE_PROVIDER_SITE_OTHER): Payer: Medicare Other

## 2018-04-10 DIAGNOSIS — L600 Ingrowing nail: Secondary | ICD-10-CM

## 2018-04-10 NOTE — Patient Instructions (Signed)

## 2018-04-14 NOTE — Progress Notes (Signed)
Mr. Marczak is here for follow-up appointment, recent procedure performed removal of ingrown toenail.  He states the area is tender at times, but overall is doing well.  No redness, no erythema, no swelling, no drainage, no other signs and symptoms of infection.  The areas healing well.  Discussed signs and symptoms of infection with patient verbal and written instructions were given to the patient he is to follow-up as needed with any acute symptom changes

## 2018-05-05 ENCOUNTER — Other Ambulatory Visit: Payer: Self-pay | Admitting: Cardiology

## 2018-05-20 ENCOUNTER — Encounter: Payer: Self-pay | Admitting: Cardiology

## 2018-05-20 ENCOUNTER — Ambulatory Visit (INDEPENDENT_AMBULATORY_CARE_PROVIDER_SITE_OTHER): Payer: Medicare Other | Admitting: Cardiology

## 2018-05-20 ENCOUNTER — Encounter (INDEPENDENT_AMBULATORY_CARE_PROVIDER_SITE_OTHER): Payer: Self-pay

## 2018-05-20 VITALS — BP 110/62 | HR 69 | Ht 69.0 in | Wt 175.0 lb

## 2018-05-20 DIAGNOSIS — E7800 Pure hypercholesterolemia, unspecified: Secondary | ICD-10-CM

## 2018-05-20 DIAGNOSIS — I2510 Atherosclerotic heart disease of native coronary artery without angina pectoris: Secondary | ICD-10-CM

## 2018-05-20 DIAGNOSIS — I6523 Occlusion and stenosis of bilateral carotid arteries: Secondary | ICD-10-CM | POA: Diagnosis not present

## 2018-05-20 DIAGNOSIS — I10 Essential (primary) hypertension: Secondary | ICD-10-CM | POA: Diagnosis not present

## 2018-05-20 MED ORDER — ATORVASTATIN CALCIUM 20 MG PO TABS: ORAL_TABLET | ORAL | 3 refills | Status: DC

## 2018-05-20 MED ORDER — AMLODIPINE BESYLATE 5 MG PO TABS: 5.0000 mg | ORAL_TABLET | Freq: Every day | ORAL | 3 refills | Status: DC

## 2018-05-20 NOTE — Progress Notes (Signed)
Cardiology Office Note:    Date:  05/20/2018   ID:  Julian Weaver, DOB [birth year], MRN [id number]  PCP:  Patient, No Pcp Per  Cardiologist:  No primary care provider on file.    Referring MD: Shaune Pollack, MD   Chief Complaint  Patient presents with  . Coronary Artery Disease  . Hypertension  . Hyperlipidemia    History of Present Illness:    Julian Weaver is a 73 y.o. male with a hx of CAD status post PCI/DESRCA in 2010, hypertension, hyperlipidemia, tobacco abuse, carotid bruits 1-39% dopplers 03/2015. He is here today for followup and is doing well.  He denies any chest pain or pressure, SOB, DOE, PND, orthopnea, LE edema, dizziness, palpitations or syncope. He is compliant with his meds and is tolerating meds with no SE.    Past Medical History:  Diagnosis Date  . Allergic rhinitis   . Arthritis   . Carotid artery stenosis    1-39% bilateral dopplers 04/2017  . Coronary artery disease 04/13/2008   Drug eluting stent in RCA  . Hypercholesteremia   . Hypertension   . RBBB   . Renal disorder   . Tobacco abuse     Past Surgical History:  Procedure Laterality Date  . CAROTID STENT  2010  . SHOULDER ACROMIOPLASTY  2010    Current Medications: Current Meds  Medication Sig  . amLODipine (NORVASC) 5 MG tablet Take 1 tablet (5 mg total) by mouth daily. Please keep upcoming appt in February with Dr. Mayford Knife for future refills. Thank you  . aspirin 81 MG EC tablet Take 1 tablet (81 mg total) by mouth daily.  [name] atorvastatin (LIPITOR) 20 MG tablet TAKE 1 TABLET(20 MG) BY MOUTH DAILY  . buPROPion (WELLBUTRIN XL) 150 MG 24 hr tablet Take 150 mg by mouth daily.   [name] co-enzyme Q-10 30 MG capsule Take 100 mg by mouth daily.  [name] EPINEPHrine (EPIPEN IJ) Inject as directed as needed (bee stings).   [name] etodolac (LODINE) 400 MG tablet Take 400 mg by mouth 2 (two) times daily as needed.   . fluticasone (FLONASE) 50 MCG/ACT nasal spray Place 2 sprays into both nostrils  daily as needed.   . neomycin-polymyxin-hydrocortisone (CORTISPORIN) OTIC solution Apply 2 drops to the ingrown toenail site twice daily. Cover with band-aid.  [name] omeprazole (PRILOSEC) 20 MG capsule Take 20 mg by mouth daily.   Current Facility-Administered Medications for the 05/20/18 encounter (Office Visit) with Quintella Reichert, MD  Medication  . triamcinolone acetonide (KENALOG) 10 MG/ML injection 10 mg     Allergies:   Bee venom   Social History   Socioeconomic History  . Marital status: Married    Spouse name: Not on file  . Number of children: Not on file  . Years of education: Not on file  . Highest education level: Not on file  Occupational History  . Not on file  Social Needs  . Financial resource strain: Not on file  . Food insecurity:    Worry: Not on file    Inability: Not on file  . Transportation needs:    Medical: Not on file    Non-medical: Not on file  Tobacco Use  . Smoking status: Current Every Day Smoker    Packs/day: 1.50    Years: 56.00    Pack years: 84.00    Start date: 10/27/1982  . Smokeless tobacco: Never Used  Substance and Sexual Activity  . Alcohol use: No  Alcohol/week: 0.0 standard drinks  . Drug use: No  . Sexual activity: Not on file  Lifestyle  . Physical activity:    Days per week: Not on file    Minutes per session: Not on file  . Stress: Not on file  Relationships  . Social connections:    Talks on phone: Not on file    Gets together: Not on file    Attends religious service: Not on file    Active member of club or organization: Not on file    Attends meetings of clubs or organizations: Not on file    Relationship status: Not on file  Other Topics Concern  . Not on file  Social History Narrative  . Not on file     Family History: The patient's family history includes Stroke in his father and mother.  ROS:   Please see the history of present illness.    ROS  All other systems reviewed and negative.    EKGs/Labs/Other Studies Reviewed:    The following studies were reviewed today: none  EKG:  EKG is  ordered today.  The ekg ordered today demonstrates normal sinus rhythm at 69 bpm with right bundle branch block.  Recent Labs: 07/15/2017: Hemoglobin 15.2; Platelets 225   Recent Lipid Panel    Component Value Date/Time   CHOL 115 04/18/2017 0829   TRIG 75 04/18/2017 0829   HDL 48 04/18/2017 0829   CHOLHDL 2.4 04/18/2017 0829   CHOLHDL 2.5 05/24/2015 0816   VLDL 16 05/24/2015 0816   LDLCALC 52 04/18/2017 0829    Physical Exam:    VS:  BP 110/62   Pulse 69   Ht 5\' 9"  (1.753 m)   Wt 175 lb (79.4 kg)   BMI 25.84 kg/m     Wt Readings from Last 3 Encounters:  05/20/18 175 lb (79.4 kg)  04/18/17 173 lb 9.6 oz (78.7 kg)  01/06/17 176 lb 12.8 oz (80.2 kg)     GEN:  Well nourished, well developed in no acute distress HEENT: Normal NECK: No JVD; No carotid bruits LYMPHATICS: No lymphadenopathy CARDIAC: RRR, no murmurs, rubs, gallops RESPIRATORY:  Clear to auscultation without rales, wheezing or rhonchi  ABDOMEN: Soft, non-tender, non-distended MUSCULOSKELETAL:  No edema; No deformity  SKIN: Warm and dry NEUROLOGIC:  Alert and oriented x 3 PSYCHIATRIC:  Normal affect   ASSESSMENT:    1. Atherosclerosis of native coronary artery of native heart without angina pectoris   2. Essential hypertension, benign   3. Bilateral carotid artery stenosis   4. Pure hypercholesterolemia    PLAN:    In order of problems listed above:  1.  ASCAD - s/p  PCI/DESRCA in 2010.  He has not had any anginal symptoms since I saw him last.  He will continue on aspirin 81 mg daily, Lipitor 20 mg daily. LDL is at goal.  2.  HTN -BP is well controlled on exam today.  He will continue on amlodipine 5 mg daily.  3.  Bilateral carotid artery stenosis -Dopplers 04/29/2017 showed 1 to 39% stenosis bilaterally.  Continue aspirin and statin.  4.  Hyperlipidemia -LDL goal is less than 70.  His LDL  was 40 on 12/29/2017 and ALT was normal at 16.  He will continue on Lipitor 20 mg daily.   Medication Adjustments/Labs and Tests Ordered: Current medicines are reviewed at length with the patient today.  Concerns regarding medicines are outlined above.  No orders of the defined types were placed in  this encounter.  No orders of the defined types were placed in this encounter.   Signed, Armanda Magic, MD  05/20/2018 8:43 AM    [facility]

## 2018-05-20 NOTE — Patient Instructions (Signed)

## 2018-05-21 DIAGNOSIS — J209 Acute bronchitis, unspecified: Secondary | ICD-10-CM | POA: Diagnosis not present

## 2018-05-21 DIAGNOSIS — J019 Acute sinusitis, unspecified: Secondary | ICD-10-CM | POA: Diagnosis not present

## 2018-07-30 ENCOUNTER — Ambulatory Visit (INDEPENDENT_AMBULATORY_CARE_PROVIDER_SITE_OTHER): Payer: Medicare Other

## 2018-07-30 ENCOUNTER — Other Ambulatory Visit: Payer: Self-pay

## 2018-07-30 ENCOUNTER — Ambulatory Visit (INDEPENDENT_AMBULATORY_CARE_PROVIDER_SITE_OTHER): Payer: Medicare Other | Admitting: Orthopaedic Surgery

## 2018-07-30 ENCOUNTER — Encounter: Payer: Self-pay | Admitting: Orthopaedic Surgery

## 2018-07-30 VITALS — BP 148/71 | HR 71 | Ht 69.0 in | Wt 170.0 lb

## 2018-07-30 DIAGNOSIS — M25552 Pain in left hip: Secondary | ICD-10-CM

## 2018-07-30 DIAGNOSIS — M7072 Other bursitis of hip, left hip: Secondary | ICD-10-CM | POA: Diagnosis not present

## 2018-07-30 DIAGNOSIS — I6523 Occlusion and stenosis of bilateral carotid arteries: Secondary | ICD-10-CM | POA: Diagnosis not present

## 2018-07-30 NOTE — Progress Notes (Signed)
Office Visit Note   Patient: Julian Weaver           Date of Birth: [birth year]           MRN: [id number] Visit Date: 07/30/2018              Requested by: No referring provider defined for this encounter. PCP: Patient, No Pcp Per   Assessment & Plan: Visit Diagnoses:  1. Ischial bursitis of left side   2. Pain in left hip     Plan:  #1: We will have Dr. Alvester Morin inject the left ischial tuberosity under Doppler or fluoroscopy. #2: Follow back up in 3 to 4 weeks or earlier if no better.   Follow-Up Instructions: Return if symptoms worsen or fail to improve.   Orders:  Orders Placed This Encounter  Procedures  . XR Pelvis 1-2 Views  . XR Lumbar Spine 2-3 Views  . Ambulatory referral to Physical Medicine Rehab   No orders of the defined types were placed in this encounter.     Procedures: No procedures performed   Clinical Data: No additional findings.   Subjective: Chief Complaint  Patient presents with  . Left Hip - Pain   HPI  Patient presents today for left hip pain. He said that it has been hurting for about a month. No known injury. He said that it hurts posteriorly and stays there. The pain is worse with sitting. He feels better with standing. No pain radiates down his leg or into his groin. No back pain. No numbness or tingling in his lower extremities. He takes Etodolac daily.    Review of Systems  Constitutional: Negative for fatigue.  HENT: Negative for ear pain.   Eyes: Negative for pain.  Respiratory: Negative for shortness of breath.   Cardiovascular: Negative for leg swelling.  Gastrointestinal: Negative for constipation and diarrhea.  Endocrine: Negative for cold intolerance and heat intolerance.  Genitourinary: Negative for difficulty urinating.  Musculoskeletal: Positive for joint swelling.  Skin: Negative for rash.  Allergic/Immunologic: Negative for food allergies.  Neurological: Negative for weakness.  Hematological: Does not  bruise/bleed easily.  Psychiatric/Behavioral: Negative for sleep disturbance.     Objective: Vital Signs: BP (!) 148/71   Pulse 71   Ht 5\' 9"  (1.753 m)   Wt 170 lb (77.1 kg)   BMI 25.10 kg/m   Physical Exam Constitutional:      Appearance: He is well-developed.  Eyes:     Pupils: Pupils are equal, round, and reactive to light.  Pulmonary:     Effort: Pulmonary effort is normal.  Skin:    General: Skin is warm and dry.  Neurological:     Mental Status: He is alert and oriented to person, place, and time.  Psychiatric:        Behavior: Behavior normal.     Ortho Exam  Exam today reveals tenderness over the left ischial tuberosity to palpation.  He is got smooth motion of both hips without pain.  Limited motion though bilaterally but equal.  Not painful though.  Negative straight leg raising.  Deep tendon reflexes are absent in both lower extremities.  Good strength in the lower extremities bilaterally.  Specialty Comments:  No specialty comments available.  Imaging: Xr Lumbar Spine 2-3 Views  Result Date: 07/30/2018 2 view x-ray lumbar spine reveals facet sclerosing and changes.  L5-S1 disc space narrowing questionable mild retrolisthesis L5 on 1 also L3 on L4.  L3-4.  Calcification in the aorta.  Some degenerative changes in the SI joints bilaterally.  Xr Pelvis 1-2 Views  Result Date: 07/30/2018 AP pelvis reveals some degenerative spurring noted bilaterally at the superior lateral aspect of the superior acetabulum.  Maintaining good joint space.  There is some calcification in the lateral head.    PMFS History: Current Outpatient Medications  Medication Sig Dispense Refill  . amLODipine (NORVASC) 5 MG tablet Take 1 tablet (5 mg total) by mouth daily. Please keep upcoming appt in February with Dr. Mayford Knifeurner for future refills. Thank you 90 tablet 3  . aspirin 81 MG EC tablet Take 1 tablet (81 mg total) by mouth daily.    [name]. atorvastatin (LIPITOR) 20 MG tablet TAKE 1  TABLET(20 MG) BY MOUTH DAILY 90 tablet 3  . co-enzyme Q-10 30 MG capsule Take 100 mg by mouth daily.    [name]. EPINEPHrine (EPIPEN IJ) Inject as directed as needed (bee stings).     [name]. etodolac (LODINE) 400 MG tablet Take 400 mg by mouth 2 (two) times daily as needed.   1  . fluticasone (FLONASE) 50 MCG/ACT nasal spray Place 2 sprays into both nostrils daily as needed.     [name]. omeprazole (PRILOSEC) 20 MG capsule Take 20 mg by mouth daily.    [name]. buPROPion (WELLBUTRIN XL) 150 MG 24 hr tablet Take 150 mg by mouth daily.     [name]. neomycin-polymyxin-hydrocortisone (CORTISPORIN) OTIC solution Apply 2 drops to the ingrown toenail site twice daily. Cover with band-aid. (Patient not taking: Reported on 07/30/2018) 10 mL 0   Current Facility-Administered Medications  Medication Dose Route Frequency Provider Last Rate Last Dose  . triamcinolone acetonide (KENALOG) 10 MG/ML injection 10 mg  10 mg Other Once Asencion IslamStover, [place], DPM        Patient Active Problem List   Diagnosis Date Noted  . Chronic pain of left knee 05/15/2016  . Swelling of left knee joint 05/15/2016  . Carotid artery bruit 04/01/2016  . Carotid artery stenosis   . Cigarette smoker 10/27/2014  . 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 Left inguinal hernia 11/12/2013  . Coronary atherosclerosis of native coronary artery 03/11/2013  . Pure hypercholesterolemia 03/11/2013  . Essential hypertension, benign 03/11/2013  . Encounter for long-term (current) use of other medications 03/11/2013  . Bruit 03/11/2013   Past Medical History:  Diagnosis Date  . Allergic rhinitis   . Arthritis   . Carotid artery stenosis    1-39% bilateral dopplers 04/2017  . Coronary artery disease 04/13/2008   Drug eluting stent in RCA  . Hypercholesteremia   . Hypertension   . RBBB   . Renal disorder   . Tobacco abuse     Family History  Problem Relation Age of Onset  . Stroke Mother   . Stroke Father     Past Surgical History:  Procedure  Laterality Date  . CAROTID STENT  2010  . SHOULDER ACROMIOPLASTY  2010   Social History   Occupational History  . Not on file  Tobacco Use  . Smoking status: Current Every Day Smoker    Packs/day: 1.50    Years: 56.00    Pack years: 84.00    Start date: 10/27/1982  . Smokeless tobacco: Never Used  Substance and Sexual Activity  . Alcohol use: No    Alcohol/week: 0.0 standard drinks  . Drug use: No  . Sexual activity: Not on file

## 2018-09-02 ENCOUNTER — Ambulatory Visit: Payer: Medicare Other | Admitting: Orthopaedic Surgery

## 2018-12-19 DIAGNOSIS — Z23 Encounter for immunization: Secondary | ICD-10-CM | POA: Diagnosis not present

## 2019-01-07 ENCOUNTER — Encounter: Payer: Self-pay | Admitting: Family Medicine

## 2019-01-07 ENCOUNTER — Ambulatory Visit (INDEPENDENT_AMBULATORY_CARE_PROVIDER_SITE_OTHER): Payer: Medicare Other | Admitting: Family Medicine

## 2019-01-07 ENCOUNTER — Other Ambulatory Visit: Payer: Self-pay

## 2019-01-07 DIAGNOSIS — M25552 Pain in left hip: Secondary | ICD-10-CM | POA: Diagnosis not present

## 2019-01-07 MED ORDER — PREDNISONE 10 MG PO TABS: ORAL_TABLET | ORAL | 0 refills | Status: DC

## 2019-01-07 MED ORDER — HYDROCODONE-ACETAMINOPHEN 5-325 MG PO TABS: 1.0000 | ORAL_TABLET | Freq: Every evening | ORAL | 0 refills | Status: DC | PRN

## 2019-01-07 MED ORDER — BACLOFEN 10 MG PO TABS: 5.0000 mg | ORAL_TABLET | Freq: Three times a day (TID) | ORAL | 1 refills | Status: DC | PRN

## 2019-01-07 NOTE — Progress Notes (Signed)
   Office Visit Note   Patient: Julian Weaver           Date of Birth: [birth year]           MRN: [id number] Visit Date: 01/07/2019 Requested by: No referring provider defined for this encounter. PCP: Patient, No Pcp Per  Subjective: Chief Complaint  Patient presents with  . Left Hip - Pain    HPI: He is here with persistent left hip pain.  Symptoms for several months now, no injury.  Pain is in the left buttock area.  He saw Dr. Durward Fortes previously and was referred for injection but patient is very reluctant to have injections.  Recently his pain got worse so he came in for evaluation.               ROS: No fevers all other systems were reviewed and are negative.  Objective: Vital Signs: There were no vitals taken for this visit.  Physical Exam:  General:  Alert and oriented, in no acute distress. Pulm:  Breathing unlabored. Psy:  Normal mood, congruent affect. Skin: No visible rash. Left hip: He has good range of motion with internal and external rotation, with no significant pain.  Point tender over ischial tuberosity.  No pain with knee flexion against resistance.  LE strength is normal.   Imaging: none  Assessment & Plan: 1.  Left hip pain, possible ischial bursitis. - Prednisone taper, muscle relaxant and pain med. - PT or injection if persists.     Procedures: No procedures performed  No notes on file     PMFS History: Patient Active Problem List   Diagnosis Date Noted  . Chronic pain of left knee 05/15/2016  . Swelling of left knee joint 05/15/2016  . Carotid artery bruit 04/01/2016  . Carotid artery stenosis   . Cigarette smoker 10/27/2014  . Reactive airways dysfunction syndrome ([place]) 10/27/2014  . Left inguinal hernia 11/12/2013  . Coronary atherosclerosis of native coronary artery 03/11/2013  . Pure hypercholesterolemia 03/11/2013  . Essential hypertension, benign 03/11/2013  . Encounter for long-term (current) use of other medications  03/11/2013  . Bruit 03/11/2013   Past Medical History:  Diagnosis Date  . Allergic rhinitis   . Arthritis   . Carotid artery stenosis    1-39% bilateral dopplers 04/2017  . Coronary artery disease 04/13/2008   Drug eluting stent in RCA  . Hypercholesteremia   . Hypertension   . RBBB   . Renal disorder   . Tobacco abuse     Family History  Problem Relation Age of Onset  . Stroke Mother   . Stroke Father     Past Surgical History:  Procedure Laterality Date  . CAROTID STENT  2010  . SHOULDER ACROMIOPLASTY  2010   Social History   Occupational History  . Not on file  Tobacco Use  . Smoking status: Current Every Day Smoker    Packs/day: 1.50    Years: 56.00    Pack years: 84.00    Start date: 10/27/1982  . Smokeless tobacco: Never Used  Substance and Sexual Activity  . Alcohol use: No    Alcohol/week: 0.0 standard drinks  . Drug use: No  . Sexual activity: Not on file

## 2019-01-18 DIAGNOSIS — Z125 Encounter for screening for malignant neoplasm of prostate: Secondary | ICD-10-CM | POA: Diagnosis not present

## 2019-01-18 DIAGNOSIS — Z0000 Encounter for general adult medical examination without abnormal findings: Secondary | ICD-10-CM | POA: Diagnosis not present

## 2019-01-18 DIAGNOSIS — Z1211 Encounter for screening for malignant neoplasm of colon: Secondary | ICD-10-CM | POA: Diagnosis not present

## 2019-01-18 DIAGNOSIS — N521 Erectile dysfunction due to diseases classified elsewhere: Secondary | ICD-10-CM | POA: Diagnosis not present

## 2019-01-18 DIAGNOSIS — F17200 Nicotine dependence, unspecified, uncomplicated: Secondary | ICD-10-CM | POA: Diagnosis not present

## 2019-01-18 DIAGNOSIS — M1991 Primary osteoarthritis, unspecified site: Secondary | ICD-10-CM | POA: Diagnosis not present

## 2019-01-18 DIAGNOSIS — E7800 Pure hypercholesterolemia, unspecified: Secondary | ICD-10-CM | POA: Diagnosis not present

## 2019-01-18 DIAGNOSIS — Z91030 Bee allergy status: Secondary | ICD-10-CM | POA: Diagnosis not present

## 2019-01-18 DIAGNOSIS — I10 Essential (primary) hypertension: Secondary | ICD-10-CM | POA: Diagnosis not present

## 2019-01-19 DIAGNOSIS — Z1211 Encounter for screening for malignant neoplasm of colon: Secondary | ICD-10-CM | POA: Diagnosis not present

## 2019-01-22 ENCOUNTER — Telehealth: Payer: Self-pay | Admitting: Cardiology

## 2019-01-22 DIAGNOSIS — R42 Dizziness and giddiness: Secondary | ICD-10-CM | POA: Diagnosis not present

## 2019-01-22 NOTE — Telephone Encounter (Signed)
° ° °  STAT if patient feels like he/she is going to faint   1) Are you dizzy now?  PCP office calling- nurse  2) Do you feel faint or have you passed out? no  3) Do you have any other symptoms? Patient feels faint at times, ongoing for months  Have you checked your HR and BP (record if available)?  N/a  Patient had a virtual visit today with Dr Lindell Noe, patient reports dizziness for months when walking. Dr Lindell Noe wants to consider ETT/ advice from Dr Radford Pax. Please call [phone] ask for Dr Toni Amend nurse

## 2019-01-22 NOTE — Telephone Encounter (Signed)
I spoke with Julian Weaver at Dr Rosario Jacks office. Pt had well visit last week and did not mention having episodes of dizziness.  When called with lab results he told staff he was having dizziness at times.  Virtual visit was done today. Julian Weaver reports pt has been having 3-4 episodes of dizziness per week over the last several months.  Originally attributed to being out in the heat but episodes have continued. He is a full time Psychiatrist. Example of one episode is pt was walking through [place] and felt dizzy like he would faint. Lasted about 3-4 minutes.  No palpitations.  Dr Lindell Noe wanted to make Dr Radford Pax aware and see if ETT was indicated.  Recent lab results and last office note have been faxed to our office. Note from today's visit will be faxed.

## 2019-01-22 NOTE — Telephone Encounter (Signed)
Dr Radford Pax,  Do you want follow up appointment while he is wearing monitor or after results available?  Thanks

## 2019-01-22 NOTE — Telephone Encounter (Signed)
I spoke with pt and gave him information from Dr Radford Pax.  Follow up appointment made for December 21,2020 at 8:00 with Daune Perch, NP

## 2019-01-22 NOTE — Telephone Encounter (Signed)
After monitor completed

## 2019-01-22 NOTE — Telephone Encounter (Signed)
Left message on voicemail for Dr Rosario Jacks nurse Jac Canavan) to call office.

## 2019-01-22 NOTE — Telephone Encounter (Signed)
Please get a 30 day event monitor and have patient check BP when he gets dizzy - if sx worsen call back otherwise followup with extender in 2-3 weeks

## 2019-01-23 ENCOUNTER — Other Ambulatory Visit: Payer: Self-pay | Admitting: Family Medicine

## 2019-02-10 ENCOUNTER — Telehealth: Payer: Self-pay | Admitting: *Deleted

## 2019-02-10 NOTE — Telephone Encounter (Signed)
[employer] to ship a 30 day cardiac event monitor to the patients home.  Instructions included in monitor kit.  [employer] telephone number is [phone].  Please contact [employer] once you have applied your monitor to send baseline and confirm your monitor is transmitting.  They will answer any questions regarding monitor instructions at that time.

## 2019-02-15 DIAGNOSIS — N5201 Erectile dysfunction due to arterial insufficiency: Secondary | ICD-10-CM | POA: Diagnosis not present

## 2019-02-15 DIAGNOSIS — N200 Calculus of kidney: Secondary | ICD-10-CM | POA: Diagnosis not present

## 2019-02-16 ENCOUNTER — Other Ambulatory Visit: Payer: Self-pay | Admitting: Cardiology

## 2019-02-16 MED ORDER — AMLODIPINE BESYLATE 5 MG PO TABS: 5.0000 mg | ORAL_TABLET | Freq: Every day | ORAL | 0 refills | Status: DC

## 2019-02-26 NOTE — Telephone Encounter (Signed)
I spoke with pt. He received monitor but has not put it on yet.  Will start wearing this Sunday. He reports he is feeling OK and it agreeable to moving 12/21 appointment to later so results will be available.  Appointment rescheduled for patient to see Brierley Dopp, PA on January 19,2021 at 8:45

## 2019-03-15 ENCOUNTER — Ambulatory Visit: Payer: Medicare Other | Admitting: Cardiology

## 2019-04-12 NOTE — Progress Notes (Signed)
Cardiology Office Note:    Date:  04/13/2019   ID:  Julian Weaver, DOB 1945-06-27, MRN [id number]  PCP:  Patient, No Pcp Per  Cardiologist:  Armanda Magic, MD  Electrophysiologist:  None   Referring MD: No ref. provider found   Chief Complaint  Patient presents with  . Follow-up    Dizziness    History of Present Illness:    Julian Weaver is a 74 y.o. male with:   Coronary artery disease   S/p DES to RCA in 2010  Hypertension   Hyperlipidemia   RBBB  Tobacco use  Carotid artery Dz   Julian Weaver in 2017: Bilat 1-39  Julian Weaver was last seen by Dr. Mayford Knife in 04/2018. He called in to the office in 12/2018 with complaints of dizziness.  An event monitor was ordered.  There are no results available in the chart.Julian Weaver    He returns for follow-up.  He is here alone.  He works in [employer].  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  That job is finished and he can wear it now without difficulty.  He notes several episodes of near syncope.  These all occur with exertion.  It typically occurs when he is walking briskly through a store.  He has not passed out.  He has not had chest discomfort.  He has noted some exertional dyspnea as well over the past several months.  He has not had orthopnea, paroxysmal nocturnal dyspnea or leg swelling.    Prior CV studies:   The following studies were reviewed today:  Carotid Julian Weaver 04/2017 Bilat ICA 1-39  Echocardiogram 01/13/17 Mild conc LVH, EF 60-65, no RWMA, trivial MR, trivial TR  Cardiac catheterization 04/11/2018 LM dist 20-30 LAD irregs, apical 20-30 LCx irregs RCA prox 60-70 PCI: Xience DES to RCA   Past Medical History:  Diagnosis Date  . Allergic rhinitis   . Arthritis   . Carotid artery stenosis    1-39% bilateral dopplers 04/2017  . Coronary artery disease 04/13/2008   Drug eluting stent in RCA  . Hypercholesteremia   . Hypertension   . RBBB   . Renal disorder   .  Tobacco abuse    Surgical Hx: The patient  has a past surgical history that includes Carotid stent (2010) and Shoulder acromioplasty (2010).   Current Medications: Current Meds  Medication Sig  . amLODipine (NORVASC) 5 MG tablet Take 1 tablet (5 mg total) by mouth daily. Please keep upcoming appt in December for future refills. Thank you  . aspirin 81 MG EC tablet Take 1 tablet (81 mg total) by mouth daily.  Julian Weaver atorvastatin (LIPITOR) 20 MG tablet TAKE 1 TABLET(20 MG) BY MOUTH DAILY  . baclofen (LIORESAL) 10 MG tablet TAKE 1/2 TO 1 TABLET(5 TO 10 MG) BY MOUTH THREE TIMES DAILY AS NEEDED FOR MUSCLE SPASMS  . co-enzyme Q-10 30 MG capsule Take 100 mg by mouth daily.  Julian Weaver EPINEPHrine (EPIPEN IJ) Inject as directed as needed (bee stings).   Julian Weaver etodolac (LODINE) 400 MG tablet Take 400 mg by mouth 2 (two) times daily as needed.   . fluticasone (FLONASE) 50 MCG/ACT nasal spray Place 2 sprays into both nostrils daily as needed.   Julian Weaver HYDROcodone-acetaminophen (NORCO/VICODIN) 5-325 MG tablet Take 1-2 tablets by mouth at bedtime as needed for moderate pain.  Julian Weaver omeprazole (PRILOSEC) 20 MG capsule Take 20 mg by mouth daily.  . predniSONE (DELTASONE) 10 MG tablet Take  as directed for 12 days.  Daily dose 6,6,5,5,4,4,3,3,2,2,1,1.   Current Facility-Administered Medications for the 04/13/19 encounter (Office Visit) with Tereso Newcomer T, PA-C  Medication  . triamcinolone acetonide (KENALOG) 10 MG/ML injection 10 mg     Allergies:   Bee venom   Social History   Tobacco Use  . Smoking status: Current Every Day Smoker    Packs/day: 1.50    Years: 56.00    Pack years: 84.00    Start date: 10/27/1982  . Smokeless tobacco: Never Used  Substance Use Topics  . Alcohol use: No    Alcohol/week: 0.0 standard drinks  . Drug use: No     Family Hx: The patient's family history includes Stroke in his father and mother.  ROS:   Please see the history of present illness.    ROS All other systems reviewed and are  negative.   EKGs/Labs/Other Test Reviewed:    EKG:  EKG is  ordered today.  The ekg ordered today demonstrates normal sinus rhythm, heart rate 71, normal axis, right bundle branch block, QTC 445, no change from prior tracing  Recent Labs: No results found for requested labs within last 8760 hours.   Recent Lipid Panel Lab Results  Component Value Date/Time   CHOL 115 04/18/2017 08:29 AM   TRIG 75 04/18/2017 08:29 AM   HDL 48 04/18/2017 08:29 AM   CHOLHDL 2.4 04/18/2017 08:29 AM   CHOLHDL 2.5 05/24/2015 08:16 AM   LDLCALC 52 04/18/2017 08:29 AM     Physical Exam:    VS:  BP 130/78   Pulse 71   Ht 5\' 9"  (1.753 m)   Wt 177 lb (80.3 kg)   SpO2 97%   BMI 26.14 kg/m     Wt Readings from Last 3 Encounters:  04/13/19 177 lb (80.3 kg)  07/30/18 170 lb (77.1 kg)  05/20/18 175 lb (79.4 kg)     Physical Exam  Constitutional: He is oriented to person, place, and time. He appears well-developed and well-nourished. No distress.  HENT:  Head: Normocephalic and atraumatic.  Eyes: No scleral icterus.  Neck: No JVD present. Carotid bruit is not present. No thyromegaly present.  Cardiovascular: Normal rate, regular rhythm and normal heart sounds.  No murmur heard. Pulmonary/Chest: Effort normal and breath sounds normal. He has no rales.  Abdominal: Soft. There is no hepatomegaly.  Musculoskeletal:        General: No edema.  Lymphadenopathy:    He has no cervical adenopathy.  Neurological: He is alert and oriented to person, place, and time.  Skin: Skin is warm and dry.  Psychiatric: He has a normal mood and affect.    ASSESSMENT & PLAN:    1. Near syncope 2. DOE (dyspnea on exertion) He notes several episodes of near syncope.  These have all occurred with brisk walking.  He has not had frank syncope.  He has noted shortness of breath with exertion.  He has not had chest discomfort.  However, question if this is an anginal equivalent.  I recommended we proceed with stress  testing as well as event monitor.  -30-day event monitor  -Exercise Myoview  -Follow-up with Dr. 05/22/18 in 6 to 8 weeks  3. Coronary artery disease involving native coronary artery of native heart without angina pectoris History of drug-eluting stent to the RCA in 2010.  He had no significant disease elsewhere.  As noted, he has had some exertional shortness of breath as well as near syncope with activity.  I recommended proceeding with a stress Myoview to further evaluate.  This will help rule out hypertension with exercise, arrhythmias and ischemia.  Continue aspirin, statin.  4. Essential hypertension, benign The patient's blood pressure is controlled on his current regimen.  Continue current therapy.   5. Pure hypercholesterolemia LDL optimal on most recent lab work.  Continue current Rx.     Dispo:  Return in about 8 weeks (around 06/08/2019) for Follow up after testing w/ Dr. Radford Pax, (virtual or in-person).   Medication Adjustments/Labs and Tests Ordered: Current medicines are reviewed at length with the patient today.  Concerns regarding medicines are outlined above.  Tests Ordered: Orders Placed This Encounter  Procedures  . MYOCARDIAL PERFUSION IMAGING  . EKG 12-Lead   Medication Changes: No orders of the defined types were placed in this encounter.   Signed, Wester Dopp, PA-C  04/13/2019 9:31 AM    [facility] [place], [place], [place]  [address] Phone: [phone]; Fax: [phone]

## 2019-04-13 ENCOUNTER — Other Ambulatory Visit: Payer: Self-pay

## 2019-04-13 ENCOUNTER — Encounter: Payer: Self-pay | Admitting: Cardiology

## 2019-04-13 ENCOUNTER — Encounter (INDEPENDENT_AMBULATORY_CARE_PROVIDER_SITE_OTHER): Payer: Medicare Other

## 2019-04-13 ENCOUNTER — Encounter: Payer: Self-pay | Admitting: *Deleted

## 2019-04-13 ENCOUNTER — Encounter: Payer: Self-pay | Admitting: Physician Assistant

## 2019-04-13 ENCOUNTER — Ambulatory Visit (INDEPENDENT_AMBULATORY_CARE_PROVIDER_SITE_OTHER): Payer: Medicare Other | Admitting: Physician Assistant

## 2019-04-13 VITALS — BP 130/78 | HR 71 | Ht 69.0 in | Wt 177.0 lb

## 2019-04-13 DIAGNOSIS — R55 Syncope and collapse: Secondary | ICD-10-CM

## 2019-04-13 DIAGNOSIS — I10 Essential (primary) hypertension: Secondary | ICD-10-CM | POA: Diagnosis not present

## 2019-04-13 DIAGNOSIS — E7800 Pure hypercholesterolemia, unspecified: Secondary | ICD-10-CM | POA: Diagnosis not present

## 2019-04-13 DIAGNOSIS — R42 Dizziness and giddiness: Secondary | ICD-10-CM

## 2019-04-13 DIAGNOSIS — R0600 Dyspnea, unspecified: Secondary | ICD-10-CM | POA: Diagnosis not present

## 2019-04-13 DIAGNOSIS — I2510 Atherosclerotic heart disease of native coronary artery without angina pectoris: Secondary | ICD-10-CM

## 2019-04-13 DIAGNOSIS — R0609 Other forms of dyspnea: Secondary | ICD-10-CM

## 2019-04-13 NOTE — Progress Notes (Signed)
Patient ID: Julian Weaver, male   DOB: Sep 18, 1945, 74 y.o.   MRN: [id number] [employer] was contacted to inquire about cardiac event monitor results.  According to [employer], monitor was shipped to patient.  Patient did not apply and baseline monitor.  [employer] attempted to contact patient several times however, their calls were never returned.  [employer] cancelled order due to non-compliance.  [employer] records do not indicate that monitor was returned.

## 2019-04-13 NOTE — Progress Notes (Signed)
Patient ID: Julian Weaver, male   DOB: 10-03-1945, 74 y.o.   MRN: [id number] [name] from [employer] notified patient returned monitor from 02/10/2019 enrollment to our office.  We will return to [employer] in box with prepaid UPS shipping label. Patient re-enrolled for a 30 day cardiac event monitor and was given [id number] from our office inventory.

## 2019-04-13 NOTE — Patient Instructions (Addendum)
Medication Instructions:   Your physician recommends that you continue on your current medications as directed. Please refer to the Current Medication list given to you today.  *If you need a refill on your cardiac medications before your next appointment, please call your pharmacy*  Lab Work:  None ordered today  If you have labs (blood work) drawn today and your tests are completely normal, you will receive your results only by: [name] MyChart Message (if you have MyChart) OR . A paper copy in the mail If you have any lab test that is abnormal or we need to change your treatment, we will call you to review the results.  Testing/Procedures:  Your physician has requested that you have en exercise stress myoview. For further information please visit [email or web address] Please follow instruction sheet, as given.   Follow-Up: At [facility], you and your health needs are our priority.  As part of our continuing mission to provide you with exceptional heart care, we have created designated Provider Care Teams.  These Care Teams include your primary Cardiologist (physician) and Advanced Practice Providers (APPs -  Physician Assistants and Nurse Practitioners) who all work together to provide you with the care you need, when you need it.  Your next appointment:   8 week(s)  The format for your next appointment:   Either In Person or Virtual  Provider:   Armanda Magic, MD  Other Instructions  [email or web address]  [employer] Cardiac Event Monitor Instructions Your physician has requested you wear your cardiac event monitor for _____ days, (1-30). [employer] may call or text to confirm a shipping address. The monitor will be sent to a land address via UPS. [employer] will not ship a monitor to a PO BOX. It typically takes 3-5 days to receive your monitor after it has been enrolled. [employer] will assist with USPS tracking if your package is delayed. The telephone number  for [employer] is [phone]. Once you have received your monitor, please review the enclosed instructions. Instruction tutorials can also be viewed under help and settings on the enclosed cell phone. Your monitor has already been registered assigning a specific monitor serial # to you.  Applying the monitor Remove cell phone from case and turn it on. The cell phone works as IT consultant and needs to be within [employer] of you at all times. The cell phone will need to be charged on a daily basis. We recommend you plug the cell phone into the enclosed charger at your bedside table every night.  Monitor batteries: You will receive two monitor batteries labelled #1 and #2. These are your recorders. Plug battery #2 onto the second connection on the enclosed charger. Keep one battery on the charger at all times. This will keep the monitor battery deactivated. It will also keep it fully charged for when you need to switch your monitor batteries. A small light will be blinking on the battery emblem when it is charging. The light on the battery emblem will remain on when the battery is fully charged.  Open package of a Monitor strip. Insert battery #1 into black hood on strip and gently squeeze monitor battery onto connection as indicated in instruction booklet. Set aside while preparing skin.  Choose location for your strip, vertical or horizontal, as indicated in the instruction booklet. Shave to remove all hair from location. There cannot be any lotions, oils, powders, or colognes on skin where monitor is to be applied. Wipe skin clean with enclosed Saline wipe. Dry skin completely.  Peel paper labeled #1 off the back of the Monitor strip exposing the adhesive. Place the monitor on the chest in the vertical or horizontal position shown in the instruction booklet. One arrow on the monitor strip must be pointing upward. Carefully remove paper labeled #2, attaching remainder of strip to your  skin. Try not to create any folds or wrinkles in the strip as you apply it.  Firmly press and release the circle in the center of the monitor battery. You will hear a small beep. This is turning the monitor battery on. The heart emblem on the monitor battery will light up every 5 seconds if the monitor battery in turned on and connected to the patient securely. Do not push and hold the circle down as this turns the monitor battery off. The cell phone will locate the monitor battery. A screen will appear on the cell phone checking the connection of your monitor strip. This may read poor connection initially but change to good connection within the next minute. Once your monitor accepts the connection you will hear a series of 3 beeps followed by a climbing crescendo of beeps. A screen will appear on the cell phone showing the two monitor strip placement options. Touch the picture that demonstrates where you applied the monitor strip.  Your monitor strip and battery are waterproof. You are able to shower, bathe, or swim with the monitor on. They just ask you do not submerge deeper than 3 feet underwater. We recommend removing the monitor if you are swimming in a lake, river, or ocean.  Your monitor battery will need to be switched to a fully charged monitor battery approximately once a week. The cell phone will alert you of an action which needs to be made.  On the cell phone, tap for details to reveal connection status, monitor battery status, and cell phone battery status. The green dots indicates your monitor is in good status. A red dot indicates there is something that needs your attention.  To record a symptom, click the circle on the monitor battery. In 30-60 seconds a list of symptoms will appear on the cell phone. Select your symptom and tap save. Your monitor will record a sustained or significant arrhythmia regardless of you clicking the button. Some patients do not feel the heart  rhythm irregularities. [employer] will notify us of any serious or critical events.  Refer to instruction booklet for instructions on switching batteries, changing strips, the Do not disturb or Pause features, or any additional questions.  Call [employer] at [phone], to confirm your monitor is transmitting and record your baseline. They will answer any questions you may have regarding the monitor instructions at that time.  Returning the monitor to [place] all equipment back into blue box. Peel off strip of paper to expose adhesive and close box securely. There is a prepaid UPS shipping label on this box. Drop in a UPS drop box, or at a UPS facility like [employer]. You may also contact [employer] to arrange UPS to pick up monitor package at your home.

## 2019-04-17 ENCOUNTER — Telehealth: Payer: Self-pay | Admitting: Physician Assistant

## 2019-04-17 ENCOUNTER — Other Ambulatory Visit: Payer: Self-pay | Admitting: Physician Assistant

## 2019-04-17 DIAGNOSIS — I4891 Unspecified atrial fibrillation: Secondary | ICD-10-CM

## 2019-04-17 MED ORDER — METOPROLOL TARTRATE 25 MG PO TABS: 25.0000 mg | ORAL_TABLET | Freq: Two times a day (BID) | ORAL | 3 refills | Status: DC

## 2019-04-17 MED ORDER — APIXABAN 5 MG PO TABS: 5.0000 mg | ORAL_TABLET | Freq: Two times a day (BID) | ORAL | 1 refills | Status: DC

## 2019-04-17 NOTE — Progress Notes (Signed)
Paged by monitor company.  Patient went into atrial fibrillation 10 AM central time.  Heart rate been sustaining 160-170 since then.  I called the patient who stated at the time he went into atrial fibrillation he did felt dizziness for brief period of time.  He denies palpitation, chest pain, shortness of breath, orthopnea, PND, syncope, lower extremity edema or melena.  He is currently at work and Comptroller.  CHA2DS2-VASc 2 score is 3 for age, hypertension and vascular disease.  I had a review stroke prophylactic and he is agreed to start Eliquis 5 mg twice daily.  We will add metoprolol 25 mg twice daily for rate control.  Will check his vital signs when goes home.  If symptomatic he will give [place] a call or seek medical attention.  He will need follow-up in clinic early next week.  Device company will send strip for review.

## 2019-04-17 NOTE — Telephone Encounter (Signed)
See separate note.

## 2019-04-18 NOTE — Progress Notes (Signed)
Please get in to afib clinic early this week

## 2019-04-19 ENCOUNTER — Telehealth: Payer: Self-pay | Admitting: Cardiology

## 2019-04-19 NOTE — Telephone Encounter (Signed)
Patient returning call from today. He states he does not know who called, but that it may be in regards to his afib or heart monitor.

## 2019-04-19 NOTE — Telephone Encounter (Signed)
Spoke with patient who reports that he is feeling good and does not have any symptoms of A fib. He states that he has been taking the Eliquis and metoprolol that was prescribed by [employer], PA-C. I advised him that he will need to be seen in our atrial fibrillation clinic. Patient verbalized understanding and will call if he becomes symptomatic.

## 2019-04-19 NOTE — Progress Notes (Signed)
Left message to call back  

## 2019-04-19 NOTE — Addendum Note (Signed)
Addended by: Theresia Majors on: 04/19/2019 08:21 AM   Modules accepted: Orders

## 2019-04-22 ENCOUNTER — Other Ambulatory Visit: Payer: Self-pay

## 2019-04-22 ENCOUNTER — Telehealth (HOSPITAL_COMMUNITY): Payer: Self-pay

## 2019-04-22 ENCOUNTER — Encounter (HOSPITAL_COMMUNITY): Payer: Self-pay | Admitting: Nurse Practitioner

## 2019-04-22 ENCOUNTER — Ambulatory Visit (HOSPITAL_COMMUNITY)
Admission: RE | Admit: 2019-04-22 | Discharge: 2019-04-22 | Disposition: A | Discharge status: Home / Self Care | Payer: Medicare Other | Source: Ambulatory Visit | Attending: Nurse Practitioner | Admitting: Nurse Practitioner

## 2019-04-22 VITALS — BP 126/60 | HR 62 | Ht 69.0 in | Wt 175.6 lb

## 2019-04-22 DIAGNOSIS — R9431 Abnormal electrocardiogram [ECG] [EKG]: Secondary | ICD-10-CM | POA: Insufficient documentation

## 2019-04-22 DIAGNOSIS — Z91030 Bee allergy status: Secondary | ICD-10-CM | POA: Insufficient documentation

## 2019-04-22 DIAGNOSIS — F17210 Nicotine dependence, cigarettes, uncomplicated: Secondary | ICD-10-CM | POA: Diagnosis not present

## 2019-04-22 DIAGNOSIS — E7800 Pure hypercholesterolemia, unspecified: Secondary | ICD-10-CM | POA: Insufficient documentation

## 2019-04-22 DIAGNOSIS — I4510 Unspecified right bundle-branch block: Secondary | ICD-10-CM | POA: Insufficient documentation

## 2019-04-22 DIAGNOSIS — M1990 Unspecified osteoarthritis, unspecified site: Secondary | ICD-10-CM | POA: Insufficient documentation

## 2019-04-22 DIAGNOSIS — D6869 Other thrombophilia: Secondary | ICD-10-CM

## 2019-04-22 DIAGNOSIS — I480 Paroxysmal atrial fibrillation: Secondary | ICD-10-CM | POA: Diagnosis not present

## 2019-04-22 DIAGNOSIS — Z823 Family history of stroke: Secondary | ICD-10-CM | POA: Diagnosis not present

## 2019-04-22 DIAGNOSIS — I10 Essential (primary) hypertension: Secondary | ICD-10-CM | POA: Insufficient documentation

## 2019-04-22 DIAGNOSIS — I4891 Unspecified atrial fibrillation: Secondary | ICD-10-CM | POA: Diagnosis present

## 2019-04-22 DIAGNOSIS — Z7982 Long term (current) use of aspirin: Secondary | ICD-10-CM | POA: Diagnosis not present

## 2019-04-22 DIAGNOSIS — I4892 Unspecified atrial flutter: Secondary | ICD-10-CM | POA: Diagnosis not present

## 2019-04-22 DIAGNOSIS — I2510 Atherosclerotic heart disease of native coronary artery without angina pectoris: Secondary | ICD-10-CM | POA: Insufficient documentation

## 2019-04-22 DIAGNOSIS — Z79899 Other long term (current) drug therapy: Secondary | ICD-10-CM | POA: Diagnosis not present

## 2019-04-22 DIAGNOSIS — I6523 Occlusion and stenosis of bilateral carotid arteries: Secondary | ICD-10-CM | POA: Diagnosis not present

## 2019-04-22 DIAGNOSIS — Z7901 Long term (current) use of anticoagulants: Secondary | ICD-10-CM | POA: Insufficient documentation

## 2019-04-22 NOTE — Telephone Encounter (Signed)
Spoke with the patient, instructions given. Asked to call back with any questions. S.Leeandra Ellerson EMTP 

## 2019-04-22 NOTE — Progress Notes (Signed)
Primary Care Physician: Julian Weaver, No Pcp Per Referring Physician: Dr. Storm Frisk Weaver Julian Weaver is a 74 y.o. male with a h/o  CAD, HTN, Carotid disease, that is in the afib clinic for newly diagnosed afib. He has noticed for a while that he would have increased heart rate with presyncopal episodes. A 30 day holter monitor was recently placed. 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 Almost immediatly, he got a call from  the monitor company that told him he was out of rhythm and he was started on  metoprolol and eliquis for a CHA2DS2VASc of 4,  and felt  back to his baseline around 8 pm.  Strip showed afib vrs flutter with variable rate at 160 bpm.   He reports that he drinks one beer a week. He also smokes and drinks  around 8 cups of coffee a day, sometimes including a Starbucks with an extra shot or two of caffeine.  He does snore and he wife has woke him up at night because he was not breathing. No previous sleep study. No further episodes noted since starting BB. He is still wearing the monitor for several more  weeks. He is pending a stress test.   Today, he denies symptoms of palpitations, chest pain, shortness of breath, orthopnea, PND, lower extremity edema, dizziness, presyncope, syncope, or neurologic sequela. The Julian Weaver is tolerating medications without difficulties and is otherwise without complaint today.   Past Medical History:  Diagnosis Date  . Allergic rhinitis   . Arthritis   . Carotid artery stenosis    1-39% bilateral dopplers 04/2017  . Coronary artery disease 04/13/2008   Drug eluting stent in RCA  . Hypercholesteremia   . Hypertension   . RBBB   . Renal disorder   . Tobacco abuse    Past Surgical History:  Procedure Laterality Date  . CAROTID STENT  2010  . SHOULDER ACROMIOPLASTY  2010    Current Outpatient Medications  Medication Sig Dispense Refill  . amLODipine (NORVASC) 5 MG tablet Take 1 tablet (5 mg total)  by mouth daily. Please keep upcoming appt in December for future refills. Thank you 90 tablet 0  . apixaban (ELIQUIS) 5 MG TABS tablet Take 1 tablet (5 mg total) by mouth 2 (two) times daily. 60 tablet 1  . aspirin 81 MG EC tablet Take 1 tablet (81 mg total) by mouth daily.    [name] atorvastatin (LIPITOR) 20 MG tablet TAKE 1 TABLET(20 MG) BY MOUTH DAILY 90 tablet 3  . co-enzyme Q-10 30 MG capsule Take 100 mg by mouth daily.    [name] EPINEPHrine (EPIPEN IJ) Inject as directed as needed (bee stings).     . fluticasone (FLONASE) 50 MCG/ACT nasal spray Place 2 sprays into both nostrils daily as needed.     . metoprolol tartrate (LOPRESSOR) 25 MG tablet Take 1 tablet (25 mg total) by mouth 2 (two) times daily. 60 tablet 3  . omeprazole (PRILOSEC) 20 MG capsule Take 20 mg by mouth daily.     Current Facility-Administered Medications  Medication Dose Route Frequency Provider Last Rate Last Admin  . triamcinolone acetonide (KENALOG) 10 MG/ML injection 10 mg  10 mg Other Once Asencion Islam, DPM        Allergies  Allergen Reactions  . Bee Venom Swelling    Social History   Socioeconomic History  . Marital status: Married    Spouse name: Not on file  .  Number of children: Not on file  . Years of education: Not on file  . Highest education level: Not on file  Occupational History  . Not on file  Tobacco Use  . Smoking status: Current Every Day Smoker    Packs/day: 1.00    Years: 56.00    Pack years: 56.00    Types: Cigarettes    Start date: 10/27/1982  . Smokeless tobacco: Never Used  Substance and Sexual Activity  . Alcohol use: Yes    Alcohol/week: 1.0 standard drinks    Types: 1 Cans of beer per week    Comment: Rarely- 3 times a month  . Drug use: No  . Sexual activity: Not on file  Other Topics Concern  . Not on file  Social History Narrative  . Not on file   Social Determinants of Health   Financial Resource Strain:   . Difficulty of Paying Living Expenses: Not on file  Food  Insecurity:   . Worried About Charity fundraiser in the Last Year: Not on file  . Ran Out of Food in the Last Year: Not on file  Transportation Needs:   . Lack of Transportation (Medical): Not on file  . Lack of Transportation (Non-Medical): Not on file  Physical Activity:   . Days of Exercise per Week: Not on file  . Minutes of Exercise per Session: Not on file  Stress:   . Feeling of Stress : Not on file  Social Connections:   . Frequency of Communication with Friends and Family: Not on file  . Frequency of Social Gatherings with Friends and Family: Not on file  . Attends Religious Services: Not on file  . Active Member of Clubs or Organizations: Not on file  . Attends Archivist Meetings: Not on file  . Marital Status: Not on file  Intimate Partner Violence:   . Fear of Current or Ex-Partner: Not on file  . Emotionally Abused: Not on file  . Physically Abused: Not on file  . Sexually Abused: Not on file    Family History  Problem Relation Age of Onset  . Stroke Mother   . Stroke Father     ROS- All systems are reviewed and negative except as per the HPI above  Physical Exam: Vitals:   04/22/19 1031  BP: 126/60  Pulse: 62  Weight: 79.7 kg  Height: 5\' 9"  (1.753 m)   Wt Readings from Last 3 Encounters:  04/22/19 79.7 kg  04/13/19 80.3 kg  07/30/18 77.1 kg    Labs: Lab Results  Component Value Date   NA 141 01/27/2017   K 3.7 01/27/2017   CL 102 01/27/2017   CO2 25 01/27/2017   GLUCOSE 105 (H) 01/27/2017   BUN 15 01/27/2017   CREATININE 0.87 01/27/2017   CALCIUM 8.7 01/27/2017   No results found for: INR Lab Results  Component Value Date   CHOL 115 04/18/2017   HDL 48 04/18/2017   LDLCALC 52 04/18/2017   TRIG 75 04/18/2017     GEN- The Julian Weaver is well appearing, alert and oriented x 3 today.   Head- normocephalic, atraumatic Eyes-  Sclera clear, conjunctiva pink Ears- hearing intact Oropharynx- clear Neck- supple, no JVP Lymph- no  cervical lymphadenopathy Lungs- Clear to ausculation bilaterally, normal work of breathing Heart- Regular rate and rhythm, no murmurs, rubs or gallops, PMI not laterally displaced GI- soft, NT, ND, + BS Extremities- no clubbing, cyanosis, or edema MS- no significant deformity or  atrophy Skin- no rash or lesion Psych- euthymic mood, full affect Neuro- strength and sensation are intact  EKG-NSR with RBBB, v rate 62 bpm, pr int 140 ms, qrs int 134 ms, qtc 422 ms Last echo reviewed from 2018- no significant structural change  Strip reviewed from monitor company    Assessment and Plan: 1. New onset afib/flutter  General education re afib  Continue metoprolol tartrate 25 mg bid  Continue wearing heart monitor  Update echo   2. Lifestyle issues He will greatly cut back on caffeine He will reduce and stop tobacco use  Sleep study for snoring and witnessed apnea   3. CHA2DS2VASc score of at least 4 Bleeding precautions discussed  Continue eliquis 5 mg bid   4. CAD Pending  stress test   F/u in afib clinic as needed Pending appointment with Dr. Mayford Knife 3/24

## 2019-04-23 ENCOUNTER — Other Ambulatory Visit (HOSPITAL_COMMUNITY)
Admission: RE | Admit: 2019-04-23 | Discharge: 2019-04-23 | Disposition: A | Discharge status: Home / Self Care | Payer: Medicare Other | Source: Ambulatory Visit | Attending: Physician Assistant | Admitting: Physician Assistant

## 2019-04-23 DIAGNOSIS — Z20822 Contact with and (suspected) exposure to covid-19: Secondary | ICD-10-CM | POA: Diagnosis not present

## 2019-04-23 DIAGNOSIS — Z01812 Encounter for preprocedural laboratory examination: Secondary | ICD-10-CM | POA: Diagnosis present

## 2019-04-23 LAB — SARS CORONAVIRUS 2 (TAT 6-24 HRS): SARS Coronavirus 2: NEGATIVE

## 2019-04-26 ENCOUNTER — Telehealth: Payer: Self-pay | Admitting: *Deleted

## 2019-04-26 NOTE — Telephone Encounter (Signed)
Staff message sent to Julian Weaver ok to schedule sleep study. Patient has Medicare and does not require a PA. 

## 2019-04-26 NOTE — Telephone Encounter (Signed)
-----   Message from Shona Simpson, RN sent at 04/22/2019 11:07 AM EST ----- Regarding: sleep study Pt needs sleep study for snoring, afib per donna carroll Thanks [facility] RN [facility]

## 2019-04-27 ENCOUNTER — Encounter: Payer: Self-pay | Admitting: Physician Assistant

## 2019-04-27 ENCOUNTER — Ambulatory Visit (HOSPITAL_BASED_OUTPATIENT_CLINIC_OR_DEPARTMENT_OTHER): Payer: Medicare Other

## 2019-04-27 ENCOUNTER — Other Ambulatory Visit: Payer: Self-pay

## 2019-04-27 ENCOUNTER — Ambulatory Visit (HOSPITAL_COMMUNITY): Payer: Medicare Other | Attending: Cardiovascular Disease

## 2019-04-27 ENCOUNTER — Telehealth: Payer: Self-pay | Admitting: *Deleted

## 2019-04-27 DIAGNOSIS — R0600 Dyspnea, unspecified: Secondary | ICD-10-CM | POA: Insufficient documentation

## 2019-04-27 DIAGNOSIS — I480 Paroxysmal atrial fibrillation: Secondary | ICD-10-CM | POA: Insufficient documentation

## 2019-04-27 DIAGNOSIS — R55 Syncope and collapse: Secondary | ICD-10-CM | POA: Diagnosis present

## 2019-04-27 DIAGNOSIS — R0609 Other forms of dyspnea: Secondary | ICD-10-CM

## 2019-04-27 LAB — MYOCARDIAL PERFUSION IMAGING
Estimated workload: 10.1 METS
Exercise duration (min): 8 min
Exercise duration (sec): 0 s
LV dias vol: 94 mL (ref 62–150)
LV sys vol: 45 mL
MPHR: 149 {beats}/min
Peak HR: 134 {beats}/min
Percent HR: 89 %
Rest HR: 74 {beats}/min
SDS: 0
SRS: 0
SSS: 0
TID: 1.02

## 2019-04-27 LAB — ECHOCARDIOGRAM COMPLETE
Height: 69 in
Weight: 2832 oz

## 2019-04-27 MED ORDER — TECHNETIUM TC 99M TETROFOSMIN IV KIT
32.2000 | PACK | Freq: Once | INTRAVENOUS | Status: AC | PRN
  Administered 2019-04-27: 32.2 via INTRAVENOUS
  Filled 2019-04-27: qty 33

## 2019-04-27 MED ORDER — TECHNETIUM TC 99M TETROFOSMIN IV KIT
10.2000 | PACK | Freq: Once | INTRAVENOUS | Status: AC | PRN
  Administered 2019-04-27: 10.2 via INTRAVENOUS
  Filled 2019-04-27: qty 11

## 2019-04-27 NOTE — Telephone Encounter (Signed)
-----   Message from Gaynelle Cage, CMA sent at 04/26/2019  8:22 AM EST ----- Regarding: RE: sleep study Ok to schedule sleep study. Patient has Medicare and does not require a PA. ----- Message ----- From: Shona Simpson, RN Sent: 04/22/2019  11:07 AM EST To: [facility] Sleep Studies Subject: sleep study                                    Pt needs sleep study for snoring, afib per donna carroll Thanks [facility] RN [facility]

## 2019-04-27 NOTE — Telephone Encounter (Signed)
Patient is scheduled for lab study on 05/12/19. He is scheduled for COVID screening on 05/10/19 11:10 prior to SS. Patient understands his sleep study will be done at [facility] sleep lab. Patient understands he will receive a sleep packet in a week or so. Patient understands to call if he does not receive the sleep packet in a timely manner. Patient agrees with treatment and thanked me for call.

## 2019-04-29 ENCOUNTER — Encounter (HOSPITAL_COMMUNITY): Payer: Self-pay | Admitting: *Deleted

## 2019-04-30 ENCOUNTER — Other Ambulatory Visit (HOSPITAL_COMMUNITY): Payer: Medicare Other

## 2019-05-10 ENCOUNTER — Other Ambulatory Visit (HOSPITAL_COMMUNITY): Payer: Medicare Other

## 2019-05-12 ENCOUNTER — Encounter (HOSPITAL_BASED_OUTPATIENT_CLINIC_OR_DEPARTMENT_OTHER): Payer: Medicare Other | Admitting: Cardiology

## 2019-05-18 ENCOUNTER — Other Ambulatory Visit: Payer: Self-pay | Admitting: Cardiology

## 2019-05-18 MED ORDER — ATORVASTATIN CALCIUM 20 MG PO TABS: ORAL_TABLET | ORAL | 3 refills | Status: DC

## 2019-05-24 ENCOUNTER — Other Ambulatory Visit: Payer: Self-pay

## 2019-05-24 ENCOUNTER — Ambulatory Visit (INDEPENDENT_AMBULATORY_CARE_PROVIDER_SITE_OTHER): Payer: Medicare Other | Admitting: Family Medicine

## 2019-05-24 ENCOUNTER — Encounter: Payer: Self-pay | Admitting: Family Medicine

## 2019-05-24 ENCOUNTER — Ambulatory Visit: Payer: Self-pay

## 2019-05-24 VITALS — Ht 69.0 in | Wt 170.0 lb

## 2019-05-24 DIAGNOSIS — M25571 Pain in right ankle and joints of right foot: Secondary | ICD-10-CM | POA: Diagnosis not present

## 2019-05-24 MED ORDER — HYDROCODONE-ACETAMINOPHEN 5-325 MG PO TABS: 1.0000 | ORAL_TABLET | Freq: Four times a day (QID) | ORAL | 0 refills | Status: DC | PRN

## 2019-05-24 NOTE — Progress Notes (Signed)
Office Visit Note   Patient: Julian Weaver           Date of Birth: 1945/03/29           MRN: [id number] Visit Date: 05/24/2019 Requested by: Glenis Smoker, MD [place],  [place] [address] PCP: Glenis Smoker, MD  Subjective: Chief Complaint  Patient presents with  . Right Foot - Pain    DOI 05/21/2019    HPI: He is here with right ankle pain.  About 3 or 4 days ago he slipped on concrete floor, fell to the ground and slammed his ankle against the ground.  He felt pain shoot all the way up to his knee.  He has been unable to bear full weight on it since then and it has been swollen on the medial aspect.  He has had history of ankle problems over the years.  He had a cortisone injection in 2019 which gave him some relief.              ROS: No fevers or chills.  All other systems were reviewed and are negative.  Objective: Vital Signs: Ht 5\' 9"  (1.753 m)   Wt 170 lb (77.1 kg)   BMI 25.10 kg/m   Physical Exam:  General:  Alert and oriented, in no acute distress. Pulm:  Breathing unlabored. Psy:  Normal mood, congruent affect. Skin: There is a sore on the dorsum of his midfoot which does not look infected.  There is substantial soft tissue swelling and bruising on the medial side of his ankle. Right ankle: He is exquisitely tender to palpation around the medial malleolus.  No tenderness with syndesmosis squeeze or at the proximal fibula, no pain over the lateral malleolus.  He seems to be able to supinate actively with intact tendon function.  Imaging: X-rays right ankle: There are old appearing calcifications distal to the medial malleolus, probably from prior injury.  On the lateral view, question whether the anterior talus has a horizontal nondisplaced fracture line.  I am unable to visualize this on the AP or oblique view.  No other acute abnormality seen.    Assessment & Plan: 1.  Several days status post fall with right ankle pain,  question nondisplaced fracture of talus.  Cannot rule out tibialis posterior tendon rupture. -Short fracture boot, try to minimize standing and walking activities as able.  Return in about 7 to 10 days for recheck.  If still in severe pain, we will obtain another three-view x-ray.  If unrevealing, then possibly MRI scan. -Hydrocodone as needed for pain.      Procedures: No procedures performed  No notes on file     PMFS History: Patient Active Problem List   Diagnosis Date Noted  . Chronic pain of left knee 05/15/2016  . Swelling of left knee joint 05/15/2016  . Carotid artery bruit 04/01/2016  . Carotid artery stenosis   . Cigarette smoker 10/27/2014  . Reactive airways dysfunction syndrome ([place]) 10/27/2014  . Left inguinal hernia 11/12/2013  . Coronary atherosclerosis of native coronary artery 03/11/2013  . Pure hypercholesterolemia 03/11/2013  . Essential hypertension, benign 03/11/2013  . Encounter for long-term (current) use of other medications 03/11/2013  . Bruit 03/11/2013   Past Medical History:  Diagnosis Date  . Allergic rhinitis   . Arthritis   . Carotid artery stenosis    1-39% bilateral dopplers 04/2017  . Coronary artery disease 04/13/2008   Drug eluting stent in RCA //  Myoview 04/2019: EF 52, normal perfusion; low risk  . Hypercholesteremia   . Hypertension   . RBBB   . Renal disorder   . Tobacco abuse     Family History  Problem Relation Age of Onset  . Stroke Mother   . Stroke Father     Past Surgical History:  Procedure Laterality Date  . CAROTID STENT  2010  . SHOULDER ACROMIOPLASTY  2010   Social History   Occupational History  . Not on file  Tobacco Use  . Smoking status: Current Every Day Smoker    Packs/day: 1.00    Years: 56.00    Pack years: 56.00    Types: Cigarettes    Start date: 10/27/1982  . Smokeless tobacco: Never Used  Substance and Sexual Activity  . Alcohol use: Yes    Alcohol/week: 1.0 standard drinks    Types: 1  Cans of beer per week    Comment: Rarely- 3 times a month  . Drug use: No  . Sexual activity: Not on file

## 2019-06-10 ENCOUNTER — Other Ambulatory Visit: Payer: Self-pay | Admitting: Physician Assistant

## 2019-06-10 NOTE — Telephone Encounter (Signed)
Eliquis 5mg  refill request received, pt is 74yrs old, weight-77.1kg, Crea-0.95 on 01/18/2019 via scanned labs from [facility] PCP, Diagnosis-Afib, and last seen by [place] [facility] on 04/22/2019. Dose is appropriate based on dosing criteria. Will send in refill to requested pharmacy.

## 2019-06-15 ENCOUNTER — Ambulatory Visit: Payer: Medicare Other | Admitting: Physician Assistant

## 2019-06-16 ENCOUNTER — Ambulatory Visit (INDEPENDENT_AMBULATORY_CARE_PROVIDER_SITE_OTHER): Payer: Medicare Other | Admitting: Cardiology

## 2019-06-16 ENCOUNTER — Other Ambulatory Visit: Payer: Self-pay

## 2019-06-16 ENCOUNTER — Telehealth: Payer: Self-pay | Admitting: *Deleted

## 2019-06-16 ENCOUNTER — Encounter: Payer: Self-pay | Admitting: Cardiology

## 2019-06-16 VITALS — BP 116/70 | HR 54 | Ht 69.0 in | Wt 171.6 lb

## 2019-06-16 DIAGNOSIS — I6523 Occlusion and stenosis of bilateral carotid arteries: Secondary | ICD-10-CM | POA: Diagnosis not present

## 2019-06-16 DIAGNOSIS — R0609 Other forms of dyspnea: Secondary | ICD-10-CM

## 2019-06-16 DIAGNOSIS — I10 Essential (primary) hypertension: Secondary | ICD-10-CM

## 2019-06-16 DIAGNOSIS — R0600 Dyspnea, unspecified: Secondary | ICD-10-CM | POA: Diagnosis not present

## 2019-06-16 DIAGNOSIS — I480 Paroxysmal atrial fibrillation: Secondary | ICD-10-CM | POA: Diagnosis not present

## 2019-06-16 DIAGNOSIS — E7800 Pure hypercholesterolemia, unspecified: Secondary | ICD-10-CM

## 2019-06-16 LAB — COMPREHENSIVE METABOLIC PANEL
ALT: 18 IU/L (ref 0–44)
AST: 21 IU/L (ref 0–40)
Albumin/Globulin Ratio: 1.9 (ref 1.2–2.2)
Albumin: 4.5 g/dL (ref 3.7–4.7)
Alkaline Phosphatase: 108 IU/L (ref 39–117)
BUN/Creatinine Ratio: 13 (ref 10–24)
BUN: 13 mg/dL (ref 8–27)
Bilirubin Total: 0.5 mg/dL (ref 0.0–1.2)
CO2: 25 mmol/L (ref 20–29)
Calcium: 9.3 mg/dL (ref 8.6–10.2)
Chloride: 100 mmol/L (ref 96–106)
Creatinine, Ser: 0.98 mg/dL (ref 0.76–1.27)
GFR calc Af Amer: 89 mL/min/{1.73_m2} (ref >59)
GFR calc non Af Amer: 77 mL/min/{1.73_m2} (ref >59)
Globulin, Total: 2.4 g/dL (ref 1.5–4.5)
Glucose: 102 mg/dL — ABNORMAL HIGH (ref 65–99)
Potassium: 4.5 mmol/L (ref 3.5–5.2)
Sodium: 138 mmol/L (ref 134–144)
Total Protein: 6.9 g/dL (ref 6.0–8.5)

## 2019-06-16 LAB — LIPID PANEL
Chol/HDL Ratio: 2.2 ratio (ref 0.0–5.0)
Cholesterol, Total: 99 mg/dL — ABNORMAL LOW (ref 100–199)
HDL: 46 mg/dL (ref >39)
LDL Chol Calc (NIH): 38 mg/dL (ref 0–99)
Triglycerides: 68 mg/dL (ref 0–149)
VLDL Cholesterol Cal: 15 mg/dL (ref 5–40)

## 2019-06-16 MED ORDER — AMLODIPINE BESYLATE 2.5 MG PO TABS: 2.5000 mg | ORAL_TABLET | Freq: Every day | ORAL | 3 refills | Status: DC

## 2019-06-16 MED ORDER — METOPROLOL TARTRATE 25 MG PO TABS: 25.0000 mg | ORAL_TABLET | Freq: Two times a day (BID) | ORAL | 3 refills | Status: DC

## 2019-06-16 MED ORDER — ATORVASTATIN CALCIUM 20 MG PO TABS: ORAL_TABLET | ORAL | 3 refills | Status: DC

## 2019-06-16 NOTE — Patient Instructions (Addendum)
Medication Instructions:  Your physician has recommended you make the following change in your medication:  1) Decrease Amlodipine to 2.5 mg daily   *If you need a refill on your cardiac medications before your next appointment, please call your pharmacy*   Lab Work: TODAY: CMET and fasting lab  If you have labs (blood work) drawn today and your tests are completely normal, you will receive your results only by: [name] MyChart Message (if you have MyChart) OR . A paper copy in the mail If you have any lab test that is abnormal or we need to change your treatment, we will call you to review the results.   Testing/Procedures: Your physician has requested that you have a carotid duplex. This test is an ultrasound of the carotid arteries in your neck. It looks at blood flow through these arteries that supply the brain with blood. Allow one hour for this exam. There are no restrictions or special instructions.  Your physician has recommended that you have a sleep study. This test records several body functions during sleep, including: brain activity, eye movement, oxygen and carbon dioxide blood levels, heart rate and rhythm, breathing rate and rhythm, the flow of air through your mouth and nose, snoring, body muscle movements, and chest and belly movement.   Follow-Up: At [facility], you and your health needs are our priority.  As part of our continuing mission to provide you with exceptional heart care, we have created designated Provider Care Teams.  These Care Teams include your primary Cardiologist (physician) and Advanced Practice Providers (APPs -  Physician Assistants and Nurse Practitioners) who all work together to provide you with the care you need, when you need it.  We recommend signing up for the patient portal called "MyChart".  Sign up information is provided on this After Visit Summary.  MyChart is used to connect with patients for Virtual Visits (Telemedicine).  Patients are able  to view lab/test results, encounter notes, upcoming appointments, etc.  Non-urgent messages can be sent to your provider as well.   To learn more about what you can do with MyChart, go to [email or web address].    Your next appointment:   4 week(s)  The format for your next appointment:   In Person  Provider:   Melina Copa, PA-C or Ermalinda Barrios, PA-C  Follow up with Dr. Radford Pax in 6 months.   Other Instructions: Patient Name: Julian Weaver        DOB: 12-08-1945      Height:     Weight:  171 lb  Office Name: [facility]         Referring Provider: Dr. Fransico Him  Today's Date: 06/16/19  Date:   STOP BANG RISK ASSESSMENT S (snore) Have you been told that you snore?     YES   T (tired) Are you often tired, fatigued, or sleepy during the day?   NO  O (obstruction) Do you stop breathing, choke, or gasp during sleep? YES   P (pressure) Do you have or are you being treated for high blood pressure? YES   B (BMI) Is your body index greater than 35 kg/m? NO   A (age) Are you 70 years old or older? YES   N (neck) Do you have a neck circumference greater than 16 inches?   NO   G (gender) Are you a male? YES   TOTAL STOP/BANG "YES" ANSWERS  For Office Use Only              Procedure Order Form    YES to 3+ Stop Brennan Bailey questions OR two clinical symptoms - patient qualifies for WatchPAT (CPT 95800)     Submit: This Form + Patient Face Sheet + Clinical Note via CloudPAT or Fax: [phone]         Clinical Notes: Will consult Sleep Specialist and refer for management of therapy due to patient increased risk of Sleep Apnea. Ordering a sleep study due to the following two clinical symptoms: Excessive daytime sleepiness G47.10 / Gastroesophageal reflux K21.9 / Nocturia R35.1 / Morning Headaches G44.221 / Difficulty concentrating R41.840 / Memory problems or poor judgment G31.84 / Personality changes or  irritability R45.4 / Loud snoring R06.83 / Depression F32.9 / Unrefreshed by sleep G47.8 / Impotence N52.9 / History of high blood pressure R03.0 / Insomnia G47.00    I understand that I am proceeding with a home sleep apnea test as ordered by my treating physician. I understand that untreated sleep apnea is a serious cardiovascular risk factor and it is my responsibility to perform the test and seek management for sleep apnea. I will be contacted with the results and be managed for sleep apnea by a local sleep physician. I will be receiving equipment and further instructions from [facility]. I shall promptly ship back the equipment via the included mailing label. I understand my insurance will be billed for the test and as the patient I am responsible for any insurance related out-of-pocket costs incurred. I have been provided with written instructions and can call for additional video or telephonic instruction, with 24-hour availability of qualified personnel to answer any questions: Patient Help Desk [phone].

## 2019-06-16 NOTE — Progress Notes (Signed)
Cardiology Office Note:    Date:  06/16/2019   ID:  Julian Weaver, DOB 12/18/1945, MRN [id number]  PCP:  Shon Hale, MD  Cardiologist:  Armanda Magic, MD    Referring MD: No ref. provider found   Chief Complaint  Patient presents with  . Coronary Artery Disease  . Atrial Fibrillation  . Hypertension  . Hyperlipidemia    History of Present Illness:    Julian Weaver is a 74 y.o. male with a hx of CAD status post PCI/DESRCA in 2010, hypertension, hyperlipidemia, tobacco abuse, carotid bruits 1-39% dopplers 04/2017. He had a 2D echo which showed normal LVF with G1DD.  He also had a nuclear stress test that showed no ischemia.  Since I saw him last he developed PAF and has been seen in afib clinic and placed on apixaban and Lopressor.    He is here today for followup and is doing well.  He denies any chest pain or pressure, SOB, DOE, PND, orthopnea, LE edema,  palpitations or syncope. He does have problems occasionally with dizzy spells that come out of the blue but has never passed out.  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  He brought in his BP readings today which are normal at range in the 115-120 range systolic. He is compliant with his meds and is tolerating meds with no SE.    Past Medical History:  Diagnosis Date  . Allergic rhinitis   . Arthritis   . Carotid artery stenosis    1-39% bilateral dopplers 04/2017  . Coronary artery disease 04/13/2008   Drug eluting stent in RCA // Myoview 04/2019: EF 52, normal perfusion; low risk  . Hypercholesteremia   . Hypertension   . RBBB   . Renal disorder   . Tobacco abuse     Past Surgical History:  Procedure Laterality Date  . CAROTID STENT  2010  . SHOULDER ACROMIOPLASTY  2010    Current Medications: Current Meds  Medication Sig  . aspirin 81 MG EC tablet Take 1 tablet (81 mg total) by mouth daily.  [name] atorvastatin (LIPITOR) 20 MG tablet TAKE 1 TABLET(20 MG) BY MOUTH DAILY  . co-enzyme  Q-10 30 MG capsule Take 100 mg by mouth daily.  [name] ELIQUIS 5 MG TABS tablet TAKE 1 TABLET(5 MG) BY MOUTH TWICE DAILY  . EPINEPHrine (EPIPEN IJ) Inject as directed as needed (bee stings).   . fluticasone (FLONASE) 50 MCG/ACT nasal spray Place 2 sprays into both nostrils daily as needed.   [name] HYDROcodone-acetaminophen (NORCO/VICODIN) 5-325 MG tablet Take 1 tablet by mouth every 6 (six) hours as needed for moderate pain.  . metoprolol tartrate (LOPRESSOR) 25 MG tablet Take 1 tablet (25 mg total) by mouth 2 (two) times daily.  [name] omeprazole (PRILOSEC) 20 MG capsule Take 20 mg by mouth daily.  . [DISCONTINUED] amLODipine (NORVASC) 5 MG tablet Take 1 tablet (5 mg total) by mouth daily.  . [DISCONTINUED] atorvastatin (LIPITOR) 20 MG tablet TAKE 1 TABLET(20 MG) BY MOUTH DAILY  . [DISCONTINUED] metoprolol tartrate (LOPRESSOR) 25 MG tablet Take 1 tablet (25 mg total) by mouth 2 (two) times daily.   Current Facility-Administered Medications for the 06/16/19 encounter (Office Visit) with Quintella Reichert, MD  Medication  . triamcinolone acetonide (KENALOG) 10 MG/ML injection 10 mg     Allergies:   Bee venom   Social History   Socioeconomic History  . Marital status: Married    Spouse name: Not on file  .  Number of children: Not on file  . Years of education: Not on file  . Highest education level: Not on file  Occupational History  . Not on file  Tobacco Use  . Smoking status: Current Every Day Smoker    Packs/day: 1.00    Years: 56.00    Pack years: 56.00    Types: Cigarettes    Start date: 10/27/1982  . Smokeless tobacco: Never Used  Substance and Sexual Activity  . Alcohol use: Yes    Alcohol/week: 1.0 standard drinks    Types: 1 Cans of beer per week    Comment: Rarely- 3 times a month  . Drug use: No  . Sexual activity: Not on file  Other Topics Concern  . Not on file  Social History Narrative  . Not on file   Social Determinants of Health   Financial Resource Strain:   .  Difficulty of Paying Living Expenses:   Food Insecurity:   . Worried About Programme researcher, broadcasting/film/video in the Last Year:   . Barista in the Last Year:   Transportation Needs:   . Freight forwarder (Medical):   [name] Lack of Transportation (Non-Medical):   Physical Activity:   . Days of Exercise per Week:   . Minutes of Exercise per Session:   Stress:   . Feeling of Stress :   Social Connections:   . Frequency of Communication with Friends and Family:   . Frequency of Social Gatherings with Friends and Family:   . Attends Religious Services:   . Active Member of Clubs or Organizations:   . Attends Banker Meetings:   [name] Marital Status:      Family History: The patient's family history includes Stroke in his father and mother.  ROS:   Please see the history of present illness.    ROS  All other systems reviewed and negative.   EKGs/Labs/Other Studies Reviewed:    The following studies were reviewed today: 2D echo, nuclear stress test, OV notes from afib clinc  EKG:  EKG is done today and showed  sinus bradycardia at 54bpm   Recent Labs: No results found for requested labs within last 8760 hours.   Recent Lipid Panel    Component Value Date/Time   CHOL 115 04/18/2017 0829   TRIG 75 04/18/2017 0829   HDL 48 04/18/2017 0829   CHOLHDL 2.4 04/18/2017 0829   CHOLHDL 2.5 05/24/2015 0816   VLDL 16 05/24/2015 0816   LDLCALC 52 04/18/2017 0829    Physical Exam:    VS:  BP 116/70   Pulse (!) 54   Ht 5\' 9"  (1.753 m)   Wt 171 lb 9.6 oz (77.8 kg)   SpO2 96%   BMI 25.34 kg/m     Wt Readings from Last 3 Encounters:  06/16/19 171 lb 9.6 oz (77.8 kg)  05/24/19 170 lb (77.1 kg)  04/27/19 177 lb (80.3 kg)     GEN:  Well nourished, well developed in no acute distress HEENT: Normal NECK: No JVD; faint left carotid bruit LYMPHATICS: No lymphadenopathy CARDIAC:RRR, no murmurs, rubs, gallops RESPIRATORY:  Clear to auscultation without rales, wheezing or  rhonchi  ABDOMEN: Soft, non-tender, non-distended MUSCULOSKELETAL:  No edema; No deformity  SKIN: Warm and dry NEUROLOGIC:  Alert and oriented x 3 PSYCHIATRIC:  Normal affect   ASSESSMENT:    1. Paroxysmal atrial fibrillation (HCC)   3. Bilateral carotid artery stenosis   4. Essential hypertension, benign  5. Pure hypercholesterolemia    PLAN:    In order of problems listed above:  1.  PAF -he is maintaining sinus bradycardia on EKG -he has not had any further palpitations or weak spell -he has no bleeding problems on the Apixaban -continue Lopressor 25mg  BID and apixaban 5mg  BID.   -check BMET and Hbg today -He did not want to pursue the split night sleep study as he did not want to leave his dogs so we will get an [place] home sleep study  2.  Bilateral carotid artery stenosis -I do hear a faint bruit on the left -will repeat dopplers which were 1-39% bilateral in 2019 -continue statin -no ASA due to DOAC  3.  HTN -well controlled BP -he has dizzy spells at times so I have instructed him to decrease his Amlodipine to 2.5mg  daily -he will followup with my PA in 4 weeks for reassessment of BP  4.  HLD -LDL was 22 and HDL 53 6 months ago -repeat CMET and FLP as he is fasting today    Medication Adjustments/Labs and Tests Ordered: Current medicines are reviewed at length with the patient today.  Concerns regarding medicines are outlined above.  Orders Placed This Encounter  Procedures  . Comprehensive metabolic panel  . Lipid panel  . EKG 12-Lead  . [place] Sleep Study  . VAS US CAROTID   Meds ordered this encounter  Medications  . atorvastatin (LIPITOR) 20 MG tablet    Sig: TAKE 1 TABLET(20 MG) BY MOUTH DAILY    Dispense:  90 tablet    Refill:  3  . metoprolol tartrate (LOPRESSOR) 25 MG tablet    Sig: Take 1 tablet (25 mg total) by mouth 2 (two) times daily.    Dispense:  90 tablet    Refill:  3  . amLODipine (NORVASC) 2.5 MG tablet    Sig: Take 1 tablet  (2.5 mg total) by mouth daily.    Dispense:  90 tablet    Refill:  3    Dose decreased    Signed, Fransico Him, MD  06/16/2019 8:45 AM    [facility]

## 2019-06-16 NOTE — Telephone Encounter (Signed)
-----   Message from Theresia Majors, RN sent at 06/16/2019  9:03 AM EDT ----- Julian Weaver sleep study has been ordered. Office note, StopBang questionnaire, and pt demographics have been faxed to [employer]. Patient is aware to expect a call from [employer].  Thanks!

## 2019-06-16 NOTE — Addendum Note (Signed)
Addended by: Theresia Majors on: 06/16/2019 08:54 AM   Modules accepted: Orders

## 2019-06-23 ENCOUNTER — Encounter: Payer: Self-pay | Admitting: Cardiology

## 2019-06-23 ENCOUNTER — Ambulatory Visit (HOSPITAL_COMMUNITY)
Admission: RE | Admit: 2019-06-23 | Discharge: 2019-06-23 | Disposition: A | Discharge status: Home / Self Care | Payer: Medicare Other | Source: Ambulatory Visit | Attending: Internal Medicine | Admitting: Internal Medicine

## 2019-06-23 ENCOUNTER — Other Ambulatory Visit: Payer: Self-pay

## 2019-06-23 DIAGNOSIS — I6523 Occlusion and stenosis of bilateral carotid arteries: Secondary | ICD-10-CM | POA: Diagnosis present

## 2019-06-24 ENCOUNTER — Telehealth: Payer: Self-pay

## 2019-06-24 DIAGNOSIS — I6523 Occlusion and stenosis of bilateral carotid arteries: Secondary | ICD-10-CM

## 2019-06-24 NOTE — Telephone Encounter (Signed)
The patient has been notified of the result and verbalized understanding.  All questions (if any) were answered. Theresia Majors, RN 06/24/2019 9:46 AM

## 2019-06-24 NOTE — Telephone Encounter (Signed)
-----   Message from Quintella Reichert, MD sent at 06/23/2019 10:29 PM EDT ----- Stable 1-39% bilateral carotid stenosis.  Repeat dopplers in 2 years

## 2019-07-07 ENCOUNTER — Ambulatory Visit (INDEPENDENT_AMBULATORY_CARE_PROVIDER_SITE_OTHER): Payer: Medicare Other

## 2019-07-07 ENCOUNTER — Ambulatory Visit (INDEPENDENT_AMBULATORY_CARE_PROVIDER_SITE_OTHER): Payer: Medicare Other | Admitting: Orthopaedic Surgery

## 2019-07-07 ENCOUNTER — Other Ambulatory Visit: Payer: Self-pay

## 2019-07-07 ENCOUNTER — Other Ambulatory Visit: Payer: Self-pay | Admitting: Orthopaedic Surgery

## 2019-07-07 ENCOUNTER — Encounter: Payer: Self-pay | Admitting: Orthopaedic Surgery

## 2019-07-07 VITALS — Ht 69.0 in | Wt 170.0 lb

## 2019-07-07 DIAGNOSIS — M25562 Pain in left knee: Secondary | ICD-10-CM | POA: Diagnosis not present

## 2019-07-07 DIAGNOSIS — I6523 Occlusion and stenosis of bilateral carotid arteries: Secondary | ICD-10-CM

## 2019-07-07 DIAGNOSIS — M7072 Other bursitis of hip, left hip: Secondary | ICD-10-CM

## 2019-07-07 DIAGNOSIS — M25462 Effusion, left knee: Secondary | ICD-10-CM

## 2019-07-07 MED ORDER — METHYLPREDNISOLONE ACETATE 40 MG/ML IJ SUSP
80.0000 mg | INTRAMUSCULAR | Status: AC | PRN
  Administered 2019-07-07: 17:00:00 80 mg via INTRA_ARTICULAR

## 2019-07-07 MED ORDER — LIDOCAINE HCL 1 % IJ SOLN
2.0000 mL | INTRAMUSCULAR | Status: AC | PRN
  Administered 2019-07-07: 2 mL

## 2019-07-07 MED ORDER — BUPIVACAINE HCL 0.5 % IJ SOLN
2.0000 mL | INTRAMUSCULAR | Status: AC | PRN
  Administered 2019-07-07: 17:00:00 2 mL via INTRA_ARTICULAR

## 2019-07-07 NOTE — Progress Notes (Signed)
Office Visit Note   Patient: Julian Weaver           Date of Birth: 03/02/1946           MRN: [id number] Visit Date: 07/07/2019              Requested by: Shon Hale, MD [address] [place],  [place] [address] PCP: Shon Hale, MD   Assessment & Plan: Visit Diagnoses:  1. Acute pain of left knee   2. Ischial bursitis of left side   3. 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  Will inject the knee with cortisone and monitor response.  He does have a knee support at home.  I think he also stretched his MCL and is probably a grade 1 injury at the time of his injury this past weekend.  He also needs to follow-up with Dr. Alvester Morin for cortisone injection into the ischial tuberosity on the left.  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  We have talked about appropriate shoeing and an arch support  Follow-Up Instructions: Return if symptoms worsen or fail to improve.   Orders:  Orders Placed This Encounter  Procedures  . Large Joint Inj: L knee  . XR KNEE 3 VIEW LEFT  . Ambulatory referral to Physical Medicine Rehab   No orders of the defined types were placed in this encounter.     Procedures: Large Joint Inj: L knee on 07/07/2019 4:56 PM Indications: pain and diagnostic evaluation Details: 25 G 1.5 in needle, anteromedial approach  Arthrogram: No  Medications: 2 mL lidocaine 1 %; 2 mL bupivacaine 0.5 %; 80 mg methylPREDNISolone acetate 40 MG/ML Procedure, treatment alternatives, risks and benefits explained, specific risks discussed. Consent was given by the patient. Patient was prepped and draped in the usual sterile fashion.       Clinical Data: No additional findings.   Subjective: Chief Complaint  Patient presents with  . Left Knee - Pain  Patient presents today for left  knee pain. He said that some steel hit his lower leg Saturday and caused his knee to bend to the side. He had immediate pain all the way down to his toes. The following day it was swollen. He has had a difficult time flexing his knee. His pain is located medially. He had some leftover Hydrocodone and has taken that sparingly. No previous surgery to that left knee.  Has prior diagnosis of osteoarthritis and I believe he is had a prior cortisone injection after aspiration of an effusion.  Also has a chronic pain over the ischial bursa of his left hip and did not follow up with Dr. Alvester Morin.  Will need to reschedule that  In addition has some issues with his right foot.  He has had a painful callus on the plantar aspect of the great toe  HPI  Review of Systems   Objective: Vital Signs: Ht 5\' 9"  (1.753 m)   Wt 170 lb (77.1 kg)   BMI 25.10 kg/m   Physical Exam Constitutional:      Appearance: He is well-developed.  Eyes:     Pupils: Pupils are equal, round, and reactive to light.  Pulmonary:     Effort: Pulmonary effort is normal.  Skin:    General: Skin is warm and  dry.  Neurological:     Mental Status: He is alert and oriented to person, place, and time.  Psychiatric:        Behavior: Behavior normal.     Ortho Exam awake alert and oriented x3.  Comfortable sitting.  No effusion left knee.  Does have some tenderness and mild soft tissue swelling over the MCL but no opening with a valgus stress.  Some mild anterior medial joint pain.  Full extension of flexed over 110 degrees.  No popliteal pain or mass.  No calf pain.  Local tenderness directly over the ischial tuberosity of his left buttock  Examination of the right foot reveals pes planus with what may be deficiency of the posterior tibial tendon.  There is some discomfort and tenderness behind the medial malleolus I am not sure I can palpate a posterior tibial tendon along its distal course.  Has a painful callus on the plantar  aspect of the great toe we have discussed shaving that with a pumice stone.  Has had prior amputation of the second toe.  Good capillary refill to the foot  Specialty Comments:  No specialty comments available.  Imaging: XR KNEE 3 VIEW LEFT  Result Date: 07/07/2019 Films of the left knee obtained in 3 projections standing.  There is evidence of calcium pyrophosphate deposition with calcification within the menisci.  There are degenerative changes in all 3 compartments but predominate in the medial compartment.  The joint spaces are well-maintained.  No acute changes.  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 Pain in left knee 05/15/2016  . Swelling of left knee joint 05/15/2016  . Carotid artery bruit 04/01/2016  . Carotid artery stenosis   . Cigarette smoker 10/27/2014  . 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 Left inguinal hernia 11/12/2013  . Coronary atherosclerosis of native coronary artery 03/11/2013  . Pure hypercholesterolemia 03/11/2013  . Essential hypertension, benign 03/11/2013  . Encounter for long-term (current) use of other medications 03/11/2013  . Bruit 03/11/2013   Past Medical History:  Diagnosis Date  . Allergic rhinitis   . Arthritis   . Carotid artery stenosis    1-39% bilateral dopplers 05/2019  . Coronary artery disease 04/13/2008   Drug eluting stent in RCA // Myoview 04/2019: EF 52, normal perfusion; low risk  . Hypercholesteremia   . Hypertension   . RBBB   . Renal disorder   . Tobacco abuse     Family History  Problem Relation Age of Onset  . Stroke Mother   . Stroke Father     Past Surgical History:  Procedure Laterality Date  . CAROTID STENT  2010  . SHOULDER ACROMIOPLASTY  2010   Social History   Occupational History  . Not on file  Tobacco Use  . Smoking status: Current Every Day Smoker    Packs/day: 1.00    Years: 56.00      Pack years: 56.00    Types: Cigarettes    Start date: 10/27/1982  . Smokeless tobacco: Never Used  Substance and Sexual Activity  . Alcohol use: Yes    Alcohol/week: 1.0 standard drinks    Types: 1 Cans of beer per week    Comment: Rarely- 3 times a month  . Drug use: No  . Sexual activity: Not on file

## 2019-07-13 NOTE — Progress Notes (Signed)
Cardiology Office Note    Date:  07/14/2019   ID:  Julian Weaver [birth year], MRN [id number]  PCP:  Shon Hale, MD  Cardiologist: Armanda Magic, MD EPS: None  Chief Complaint  Patient presents with  . Follow-up    History of Present Illness:  Julian Weaver is a 74 y.o. male  with a hx of CAD status post PCI/DES RCA in 2010, hypertension, hyperlipidemia, tobacco abuse, carotid bruits 1-39% dopplers 06/23/19. He had a 2D echo 04/27/19 which showed normal LVEF with G1DD.  He also had a nuclear stress test 04/27/19 that showed no ischemia. Also has PAF on apixaban and lopressor.   Patient saw Dr. Mayford Knife 06/16/19 and was having some dizzy spells so amlodipine decreased to 2.5 mg daily and scheduled at home sleep study. Carotid dopplers stable 06/23/19.  Patient comes in for f/u. Brings list of BP's running 120-140/60-70's. P-50-70's.Complains dizziness when walking down an isle to get things for his job, but not when he first gets up. Does work on roofs about every other week but hasn't had dizziness. Continues to smoke 1/2 ppd. Quit before with wellbutrin but not ready to quit right now.   Past Medical History:  Diagnosis Date  . Allergic rhinitis   . Arthritis   . Carotid artery stenosis    1-39% bilateral dopplers 05/2019  . Coronary artery disease 04/13/2008   Drug eluting stent in RCA // Myoview 04/2019: EF 52, normal perfusion; low risk  . Hypercholesteremia   . Hypertension   . RBBB   . Renal disorder   . Tobacco abuse     Past Surgical History:  Procedure Laterality Date  . CAROTID STENT  2010  . SHOULDER ACROMIOPLASTY  2010    Current Medications: Current Meds  Medication Sig  . amLODipine (NORVASC) 2.5 MG tablet Take 1 tablet (2.5 mg total) by mouth daily.  [name] atorvastatin (LIPITOR) 20 MG tablet TAKE 1 TABLET(20 MG) BY MOUTH DAILY  . co-enzyme Q-10 30 MG capsule Take 100 mg by mouth daily.  [name] ELIQUIS 5 MG TABS tablet TAKE 1 TABLET(5 MG)  BY MOUTH TWICE DAILY  . EPINEPHrine (EPIPEN IJ) Inject as directed as needed (bee stings).   . fluticasone (FLONASE) 50 MCG/ACT nasal spray Place 2 sprays into both nostrils daily as needed.   [name] HYDROcodone-acetaminophen (NORCO/VICODIN) 5-325 MG tablet Take 1 tablet by mouth every 6 (six) hours as needed for moderate pain.  . metoprolol tartrate (LOPRESSOR) 25 MG tablet Take 1 tablet (25 mg total) by mouth 2 (two) times daily.  [name] omeprazole (PRILOSEC) 20 MG capsule Take 20 mg by mouth daily.  . [DISCONTINUED] aspirin 81 MG EC tablet Take 1 tablet (81 mg total) by mouth daily.   Current Facility-Administered Medications for the 07/14/19 encounter (Office Visit) with Dyann Kief, PA-C  Medication  . triamcinolone acetonide (KENALOG) 10 MG/ML injection 10 mg     Allergies:   Bee venom   Social History   Socioeconomic History  . Marital status: Married    Spouse name: Not on file  . Number of children: Not on file  . Years of education: Not on file  . Highest education level: Not on file  Occupational History  . Not on file  Tobacco Use  . Smoking status: Current Every Day Smoker    Packs/day: 1.00    Years: 56.00    Pack years: 56.00    Types: Cigarettes    Start date: 10/27/1982  .  Smokeless tobacco: Never Used  Substance and Sexual Activity  . Alcohol use: Yes    Alcohol/week: 1.0 standard drinks    Types: 1 Cans of beer per week    Comment: Rarely- 3 times a month  . Drug use: No  . Sexual activity: Not on file  Other Topics Concern  . Not on file  Social History Narrative  . Not on file   Social Determinants of Health   Financial Resource Strain:   . Difficulty of Paying Living Expenses:   Food Insecurity:   . Worried About Programme researcher, broadcasting/film/video in the Last Year:   . Barista in the Last Year:   Transportation Needs:   . Freight forwarder (Medical):   [name] Lack of Transportation (Non-Medical):   Physical Activity:   . Days of Exercise per Week:   .  Minutes of Exercise per Session:   Stress:   . Feeling of Stress :   Social Connections:   . Frequency of Communication with Friends and Family:   . Frequency of Social Gatherings with Friends and Family:   . Attends Religious Services:   . Active Member of Clubs or Organizations:   . Attends Banker Meetings:   [name] Marital Status:      Family History:  The patient's family history includes Stroke in his father and mother.   ROS:   Please see the history of present illness.    ROS All other systems reviewed and are negative.   PHYSICAL EXAM:   VS:  BP 110/70   Pulse (!) 56   Ht 5\' 9"  (1.753 m)   Wt 170 lb (77.1 kg)   SpO2 98%   BMI 25.10 kg/m   Physical Exam  GEN: Well nourished, well developed, in no acute distress  Neck: no JVD, carotid bruits, or masses Cardiac:RRR; no murmurs, rubs, or gallops  Respiratory:  Decreased breath sounds but clear to auscultation bilaterally, normal work of breathing GI: soft, nontender, nondistended, + BS Ext: without cyanosis, clubbing, or edema, Good distal pulses bilaterally Neuro:  Alert and Oriented x 3 Psych: euthymic mood, full affect  Wt Readings from Last 3 Encounters:  07/14/19 170 lb (77.1 kg)  07/07/19 170 lb (77.1 kg)  06/16/19 171 lb 9.6 oz (77.8 kg)      Studies/Labs Reviewed:   EKG:  EKG is not ordered today.    Recent Labs: 06/16/2019: ALT 18; BUN 13; Creatinine, Ser 0.98; Potassium 4.5; Sodium 138   Lipid Panel    Component Value Date/Time   CHOL 99 (L) 06/16/2019 0844   TRIG 68 06/16/2019 0844   HDL 46 06/16/2019 0844   CHOLHDL 2.2 06/16/2019 0844   CHOLHDL 2.5 05/24/2015 0816   VLDL 16 05/24/2015 0816   LDLCALC 38 06/16/2019 0844    Additional studies/ records that were reviewed today include:   Carotid dopplers 06/23/19 Summary:  Right Carotid: Velocities in the right ICA are consistent with a 1-39%  stenosis.                Non-hemodynamically significant plaque <50% noted in the  CCA.    Monitor 06/04/19  Sinus bradycardia, normal sinus rhythm and sinus tachycardia. The average heart rate was 72bpm and dropped as low as 48bpm.  Frequent Paroxysmal atrial fibrillation with RVR up to 193bpm.  Wide complex tachycardia up to 5 beats.     Left Carotid: Velocities in the left ICA are consistent with a 1-39%  stenosis.               Non-hemodynamically significant plaque <50% noted in the  CCA.   Vertebrals: Bilateral vertebral arteries demonstrate antegrade flow.  Subclavians: Normal flow hemodynamics were seen in bilateral subclavian               arteries.   *See table(s) above for measurements and observations.      Electronically signed by Jenkins Rouge MD on 06/23/2019 at 12:50:53 PM.        Final    Echo 04/27/19 IMPRESSIONS     1. Left ventricular ejection fraction, by visual estimation, is 55 to  60%. The left ventricle has normal function. There is no left ventricular  hypertrophy.   2. Left ventricular diastolic parameters are consistent with Grade I  diastolic dysfunction (impaired relaxation).   3. The average left ventricular global longitudinal strain is -20.2 %.   4. The left ventricle has no regional wall motion abnormalities.   5. Global right ventricle has normal systolic function.The right  ventricular size is normal. No increase in right ventricular wall  thickness.   6. Left atrial size was normal.   7. Right atrial size was normal.   8. The mitral valve is abnormal. Trivial mitral valve regurgitation.   9. The pulmonic valve was grossly normal. Pulmonic valve regurgitation is  not visualized.  10. The inferior vena cava is normal in size with greater than 50%  respiratory variability, suggesting right atrial pressure of 3 mmHg.  11. The aortic valve is tricuspid. Aortic valve regurgitation is not  visualized.  12. The tricuspid valve is grossly normal.  13. The tricuspid valve is grossly normal. Tricuspid valve regurgitation  is  trivial.   NST 04/27/19 Study Highlights   Nuclear stress EF: 52%.  Blood pressure demonstrated a normal response to exercise.  There was no ST segment deviation noted during stress.  No T wave inversion was noted during stress.  The study is normal.  This is a low risk study.  The left ventricular ejection fraction is mildly decreased (45-54%).      ASSESSMENT:    1. Paroxysmal atrial fibrillation (HCC)   2. Essential hypertension   3. Coronary artery disease involving native coronary artery of native heart without angina pectoris   4. Hyperlipidemia, unspecified hyperlipidemia type   5. Bilateral carotid artery stenosis   6. Tobacco abuse      PLAN:  In order of problems listed above:  PAF on eliquis and metoprolol bmet stable, no bleeding problems. Still sound like he having some fast HR when walking associated with dizziness. He'll keep track of his pulse during this and let us know. Set up another appt with afib clinic.  HTN-amlodipine decreased 2.5 mg daily-BP stable-no orthostatic symptoms   CAD status post PCI/DES RCA in 2010-still on ASA. Reviewed with Dr. Radford Pax and will continue ASA.  HLD-LDL 38  Carotid stenosis dopplers stable 06/23/19  Tobacco abuse-smoking cessation discussed but not ready to quit.  Medication Adjustments/Labs and Tests Ordered: Current medicines are reviewed at length with the patient today.  Concerns regarding medicines are outlined above.  Medication changes, Labs and Tests ordered today are listed in the Patient Instructions below. There are no Patient Instructions on file for this visit.   Signed, Ermalinda Barrios, PA-C  07/14/2019 9:20 AM    [facility] [place], [place], [place]  [address] Phone: [phone]; Fax: [phone]

## 2019-07-14 ENCOUNTER — Encounter: Payer: Self-pay | Admitting: Physician Assistant

## 2019-07-14 ENCOUNTER — Other Ambulatory Visit: Payer: Self-pay

## 2019-07-14 ENCOUNTER — Ambulatory Visit (INDEPENDENT_AMBULATORY_CARE_PROVIDER_SITE_OTHER): Payer: Medicare Other | Admitting: Physician Assistant

## 2019-07-14 VITALS — BP 110/70 | HR 56 | Ht 69.0 in | Wt 170.0 lb

## 2019-07-14 DIAGNOSIS — I6523 Occlusion and stenosis of bilateral carotid arteries: Secondary | ICD-10-CM

## 2019-07-14 DIAGNOSIS — I10 Essential (primary) hypertension: Secondary | ICD-10-CM | POA: Diagnosis not present

## 2019-07-14 DIAGNOSIS — Z720 Tobacco use: Secondary | ICD-10-CM

## 2019-07-14 DIAGNOSIS — I480 Paroxysmal atrial fibrillation: Secondary | ICD-10-CM

## 2019-07-14 DIAGNOSIS — E785 Hyperlipidemia, unspecified: Secondary | ICD-10-CM

## 2019-07-14 DIAGNOSIS — I2510 Atherosclerotic heart disease of native coronary artery without angina pectoris: Secondary | ICD-10-CM | POA: Diagnosis not present

## 2019-07-14 MED ORDER — ASPIRIN EC 81 MG PO TBEC: 81.0000 mg | DELAYED_RELEASE_TABLET | Freq: Every day | ORAL | 3 refills | Status: DC

## 2019-07-14 NOTE — Patient Instructions (Signed)
Medication Instructions:  Your physician recommends that you continue on your current medications as directed. Please refer to the Current Medication list given to you today.  Lab Work: None ordered  If you have labs (blood work) drawn today and your tests are completely normal, you will receive your results only by: [name] MyChart Message (if you have MyChart) OR . A paper copy in the mail If you have any lab test that is abnormal or we need to change your treatment, we will call you to review the results.   Testing/Procedures: None ordered   Follow-Up: Follow up with Dr. Radford Pax on 09/13/19 at 8:20 AM   Other Instructions Monitor your heart rate when you are feeling dizzy   Steps to Quit Smoking Smoking tobacco is the leading cause of preventable death. It can affect almost every organ in the body. Smoking puts you and people around you at risk for many serious, long-lasting (chronic) diseases. Quitting smoking can be hard, but it is one of the best things that you can do for your health. It is never too late to quit. How do I get ready to quit? When you decide to quit smoking, make a plan to help you succeed. Before you quit:  Pick a date to quit. Set a date within the next 2 weeks to give you time to prepare.  Write down the reasons why you are quitting. Keep this list in places where you will see it often.  Tell your family, friends, and co-workers that you are quitting. Their support is important.  Talk with your doctor about the choices that may help you quit.  Find out if your health insurance will pay for these treatments.  Know the people, places, things, and activities that make you want to smoke (triggers). Avoid them. What first steps can I take to quit smoking?  Throw away all cigarettes at home, at work, and in your car.  Throw away the things that you use when you smoke, such as ashtrays and lighters.  Clean your car. Make sure to empty the ashtray.  Clean  your home, including curtains and carpets. What can I do to help me quit smoking? Talk with your doctor about taking medicines and seeing a counselor at the same time. You are more likely to succeed when you do both.  If you are pregnant or breastfeeding, talk with your doctor about counseling or other ways to quit smoking. Do not take medicine to help you quit smoking unless your doctor tells you to do so. To quit smoking: Quit right away  Quit smoking totally, instead of slowly cutting back on how much you smoke over a period of time.  Go to counseling. You are more likely to quit if you go to counseling sessions regularly. Take medicine You may take medicines to help you quit. Some medicines need a prescription, and some you can buy over-the-counter. Some medicines may contain a drug called nicotine to replace the nicotine in cigarettes. Medicines may:  Help you to stop having the desire to smoke (cravings).  Help to stop the problems that come when you stop smoking (withdrawal symptoms). Your doctor may ask you to use:  Nicotine patches, gum, or lozenges.  Nicotine inhalers or sprays.  Non-nicotine medicine that is taken by mouth. Find resources Find resources and other ways to help you quit smoking and remain smoke-free after you quit. These resources are most helpful when you use them often. They include:  Online chats with  a Veterinary surgeon.  Phone quitlines.  Printed Materials engineer.  Support groups or group counseling.  Text messaging programs.  Mobile phone apps. Use apps on your mobile phone or tablet that can help you stick to your quit plan. There are many free apps for mobile phones and tablets as well as websites. Examples include Quit Guide from the [employer] and smokefree.gov  What things can I do to make it easier to quit?   Talk to your family and friends. Ask them to support and encourage you.  Call a phone quitline (1-800-QUIT-NOW), reach out to support  groups, or work with a Veterinary surgeon.  Ask people who smoke to not smoke around you.  Avoid places that make you want to smoke, such as: ? Bars. ? Parties. ? Smoke-break areas at work.  Spend time with people who do not smoke.  Lower the stress in your life. Stress can make you want to smoke. Try these things to help your stress: ? Getting regular exercise. ? Doing deep-breathing exercises. ? Doing yoga. ? Meditating. ? Doing a body scan. To do this, close your eyes, focus on one area of your body at a time from head to toe. Notice which parts of your body are tense. Try to relax the muscles in those areas. How will I feel when I quit smoking? Day 1 to 3 weeks Within the first 24 hours, you may start to have some problems that come from quitting tobacco. These problems are very bad 2-3 days after you quit, but they do not often last for more than 2-3 weeks. You may get these symptoms:  Mood swings.  Feeling restless, nervous, angry, or annoyed.  Trouble concentrating.  Dizziness.  Strong desire for high-sugar foods and nicotine.  Weight gain.  Trouble pooping (constipation).  Feeling like you may vomit (nausea).  Coughing or a sore throat.  Changes in how the medicines that you take for other issues work in your body.  Depression.  Trouble sleeping (insomnia). Week 3 and afterward After the first 2-3 weeks of quitting, you may start to notice more positive results, such as:  Better sense of smell and taste.  Less coughing and sore throat.  Slower heart rate.  Lower blood pressure.  Clearer skin.  Better breathing.  Fewer sick days. Quitting smoking can be hard. Do not give up if you fail the first time. Some people need to try a few times before they succeed. Do your best to stick to your quit plan, and talk with your doctor if you have any questions or concerns. Summary  Smoking tobacco is the leading cause of preventable death. Quitting smoking can be  hard, but it is one of the best things that you can do for your health.  When you decide to quit smoking, make a plan to help you succeed.  Quit smoking right away, not slowly over a period of time.  When you start quitting, seek help from your doctor, family, or friends. This information is not intended to replace advice given to you by your health care provider. Make sure you discuss any questions you have with your health care provider. Document Revised: 12/04/2018 Document Reviewed: 05/30/2018 Elsevier Patient Education  2020 [employer].

## 2019-07-29 ENCOUNTER — Ambulatory Visit: Payer: Medicare Other | Admitting: Physical Medicine and Rehabilitation

## 2019-08-12 ENCOUNTER — Other Ambulatory Visit: Payer: Self-pay | Admitting: Physician Assistant

## 2019-09-13 ENCOUNTER — Other Ambulatory Visit: Payer: Self-pay

## 2019-09-13 ENCOUNTER — Encounter: Payer: Self-pay | Admitting: Cardiology

## 2019-09-13 ENCOUNTER — Ambulatory Visit (INDEPENDENT_AMBULATORY_CARE_PROVIDER_SITE_OTHER): Payer: Medicare Other | Admitting: Cardiology

## 2019-09-13 ENCOUNTER — Other Ambulatory Visit: Payer: Self-pay | Admitting: Cardiology

## 2019-09-13 VITALS — BP 112/60 | HR 55 | Ht 69.0 in | Wt 175.0 lb

## 2019-09-13 DIAGNOSIS — I2510 Atherosclerotic heart disease of native coronary artery without angina pectoris: Secondary | ICD-10-CM | POA: Diagnosis not present

## 2019-09-13 DIAGNOSIS — I480 Paroxysmal atrial fibrillation: Secondary | ICD-10-CM

## 2019-09-13 DIAGNOSIS — I10 Essential (primary) hypertension: Secondary | ICD-10-CM | POA: Diagnosis not present

## 2019-09-13 DIAGNOSIS — E7800 Pure hypercholesterolemia, unspecified: Secondary | ICD-10-CM | POA: Diagnosis not present

## 2019-09-13 DIAGNOSIS — I6523 Occlusion and stenosis of bilateral carotid arteries: Secondary | ICD-10-CM | POA: Diagnosis not present

## 2019-09-13 LAB — HEMOGLOBIN: Hemoglobin: 14.2 g/dL (ref 13.0–17.7)

## 2019-09-13 MED ORDER — METOPROLOL TARTRATE 25 MG PO TABS: 12.5000 mg | ORAL_TABLET | Freq: Two times a day (BID) | ORAL | 3 refills | Status: DC

## 2019-09-13 NOTE — Patient Instructions (Addendum)
Please take your BP daily for one week at lunch time and call us with your readings.   Medication Instructions:  Your physician has recommended you make the following change in your medication:  1) DECREASE Lopressor to 12.5 mg twice daily  *If you need a refill on your cardiac medications before your next appointment, please call your pharmacy*  Labs TODAY: hemoglobin   Follow-Up: At [facility], you and your health needs are our priority.  As part of our continuing mission to provide you with exceptional heart care, we have created designated Provider Care Teams.  These Care Teams include your primary Cardiologist (physician) and Advanced Practice Providers (APPs -  Physician Assistants and Nurse Practitioners) who all work together to provide you with the care you need, when you need it.  We recommend signing up for the patient portal called "MyChart".  Sign up information is provided on this After Visit Summary.  MyChart is used to connect with patients for Virtual Visits (Telemedicine).  Patients are able to view lab/test results, encounter notes, upcoming appointments, etc.  Non-urgent messages can be sent to your provider as well.   To learn more about what you can do with MyChart, go to [email or web address].    Your next appointment:   6 month(s)  The format for your next appointment:   In Person  Provider:   You may see Armanda Magic, MD or one of the following Advanced Practice Providers on your designated Care Team:    Ronie Spies, PA-C  Jacolyn Reedy, PA-C

## 2019-09-13 NOTE — Progress Notes (Signed)
Cardiology Office Note:    Date:  09/13/2019   ID:  Julian Weaver, DOB 28-Feb-1946, MRN [id number]  PCP:  Shon Hale, MD  Cardiologist:  Armanda Magic, MD    Referring MD: Shon Hale, *   Chief Complaint  Patient presents with  . Coronary Artery Disease  . Hypertension  . Hyperlipidemia    History of Present Illness:    Julian Weaver is a 74 y.o. male with a hx of CAD status post PCI/DESRCA in 2010, hypertension, hyperlipidemia, tobacco abuse, carotid bruits 1-39% dopplers 04/2017. He had a 2D echo which showed normal LVF with G1DD.  He also had a nuclear stress test that showed no ischemia.  He also has PAF and has been seen in afib clinic and placed on apixaban and Lopressor. The last time I saw him he was having problems with dizziness and his amlodipine was decreased to 2.5mg  daily.     He is here today for followup and is doing well.  he denies any chest pain or pressure, SOB, DOE, PND, orthopnea, LE edema,  palpitations or syncope. He will still once in a while have dizziness when going from sitting to standing.  He is compliant with his meds and is tolerating meds with no SE.    Past Medical History:  Diagnosis Date  . Allergic rhinitis   . Arthritis   . Carotid artery stenosis    1-39% bilateral dopplers 05/2019  . Coronary artery disease 04/13/2008   Drug eluting stent in RCA // Myoview 04/2019: EF 52, normal perfusion; low risk  . Hypercholesteremia   . Hypertension   . RBBB   . Renal disorder   . Tobacco abuse     Past Surgical History:  Procedure Laterality Date  . CAROTID STENT  2010  . SHOULDER ACROMIOPLASTY  2010    Current Medications: Current Meds  Medication Sig  . amLODipine (NORVASC) 2.5 MG tablet Take 1 tablet (2.5 mg total) by mouth daily.  [name] aspirin EC 81 MG tablet Take 1 tablet (81 mg total) by mouth daily.  [name] atorvastatin (LIPITOR) 20 MG tablet TAKE 1 TABLET(20 MG) BY MOUTH DAILY  . co-enzyme Q-10 30 MG  capsule Take 100 mg by mouth daily.  [name] ELIQUIS 5 MG TABS tablet TAKE 1 TABLET(5 MG) BY MOUTH TWICE DAILY  . EPINEPHrine (EPIPEN IJ) Inject as directed as needed (bee stings).   . fluticasone (FLONASE) 50 MCG/ACT nasal spray Place 2 sprays into both nostrils daily as needed.   [name] HYDROcodone-acetaminophen (NORCO/VICODIN) 5-325 MG tablet Take 1 tablet by mouth every 6 (six) hours as needed for moderate pain.  . metoprolol tartrate (LOPRESSOR) 25 MG tablet TAKE 1 TABLET(25 MG) BY MOUTH TWICE DAILY  . omeprazole (PRILOSEC) 20 MG capsule Take 20 mg by mouth daily.   Current Facility-Administered Medications for the 09/13/19 encounter (Office Visit) with Julian Reichert, MD  Medication  . triamcinolone acetonide (KENALOG) 10 MG/ML injection 10 mg     Allergies:   Bee venom   Social History   Socioeconomic History  . Marital status: Married    Spouse name: Not on file  . Number of children: Not on file  . Years of education: Not on file  . Highest education level: Not on file  Occupational History  . Not on file  Tobacco Use  . Smoking status: Current Every Day Smoker    Packs/day: 1.00    Years: 56.00    Pack years: 56.00  Types: Cigarettes    Start date: 10/27/1982  . Smokeless tobacco: Never Used  Substance and Sexual Activity  . Alcohol use: Yes    Alcohol/week: 1.0 standard drink    Types: 1 Cans of beer per week    Comment: Rarely- 3 times a month  . Drug use: No  . Sexual activity: Not on file  Other Topics Concern  . Not on file  Social History Narrative  . Not on file   Social Determinants of Health   Financial Resource Strain:   . Difficulty of Paying Living Expenses:   Food Insecurity:   . Worried About Programme researcher, broadcasting/film/video in the Last Year:   . Barista in the Last Year:   Transportation Needs:   . Freight forwarder (Medical):   [name] Lack of Transportation (Non-Medical):   Physical Activity:   . Days of Exercise per Week:   . Minutes of Exercise per  Session:   Stress:   . Feeling of Stress :   Social Connections:   . Frequency of Communication with Friends and Family:   . Frequency of Social Gatherings with Friends and Family:   . Attends Religious Services:   . Active Member of Clubs or Organizations:   . Attends Banker Meetings:   [name] Marital Status:      Family History: The patient's family history includes Stroke in his father and mother.  ROS:   Please see the history of present illness.    ROS  All other systems reviewed and negative.   EKGs/Labs/Other Studies Reviewed:    The following studies were reviewed today: 2D echo, nuclear stress test, OV notes from afib clinc  EKG:  EKG is done today and showed  sinus bradycardia at 54bpm   Recent Labs: 06/16/2019: ALT 18; BUN 13; Creatinine, Ser 0.98; Potassium 4.5; Sodium 138   Recent Lipid Panel    Component Value Date/Time   CHOL 99 (L) 06/16/2019 0844   TRIG 68 06/16/2019 0844   HDL 46 06/16/2019 0844   CHOLHDL 2.2 06/16/2019 0844   CHOLHDL 2.5 05/24/2015 0816   VLDL 16 05/24/2015 0816   LDLCALC 38 06/16/2019 0844    Physical Exam:    VS:  BP 112/60   Pulse (!) 55   Ht 5\' 9"  (1.753 m)   Wt 175 lb (79.4 kg)   SpO2 97%   BMI 25.84 kg/m     Wt Readings from Last 3 Encounters:  09/13/19 175 lb (79.4 kg)  07/14/19 170 lb (77.1 kg)  07/07/19 170 lb (77.1 kg)     GEN: Well nourished, well developed in no acute distress HEENT: Normal NECK: No JVD; No carotid bruits LYMPHATICS: No lymphadenopathy CARDIAC:RRR, no murmurs, rubs, gallops RESPIRATORY:  Clear to auscultation without rales, wheezing or rhonchi  ABDOMEN: Soft, non-tender, non-distended MUSCULOSKELETAL:  No edema; No deformity  SKIN: Warm and dry NEUROLOGIC:  Alert and oriented x 3 PSYCHIATRIC:  Normal affect    ASSESSMENT:    1. Paroxysmal atrial fibrillation (HCC)   3. Bilateral carotid artery stenosis   4. Essential hypertension, benign   5. Pure hypercholesterolemia     PLAN:    In order of problems listed above:  1.  PAF -maintaining NSR on exam and denies any palpitations -he has no bleeding problems on the Apixaban -Continue Lopresssor 25mg  BID and apixaban 5mg  BID -Creatinine was stable at 0.98 in March 2021 -check Hbg today  2.  Bilateral carotid artery  stenosis -will repeat dopplers which were 1-39% bilateral in 05/2019 -continue statin -no ASA due to DOAC  3.  HTN -BP controlled on exam today -dizziness has improved with decreased amlodipine dose but still has some when going from sitting to standing.  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   4.  HLD -LDL goal < 70 -LDL was 22 last fall -continue atorvastatin 20mg  daily  5.  ASCAD -status post PCI/DESRCA in 2010 -denies any anginal sx -continue ASA, BB and statin    Medication Adjustments/Labs and Tests Ordered: Current medicines are reviewed at length with the patient today.  Concerns regarding medicines are outlined above.  No orders of the defined types were placed in this encounter.  No orders of the defined types were placed in this encounter.   Signed, Fransico Him, MD  09/13/2019 8:32 AM    [place]

## 2019-09-20 ENCOUNTER — Telehealth: Payer: Self-pay | Admitting: Cardiology

## 2019-09-20 NOTE — Telephone Encounter (Signed)
Patient wanted to give Dr. Mayford Knife the name of his house painter. The patient says his house painter is Julian Weaver and his phone number is  [phone]

## 2019-12-03 ENCOUNTER — Other Ambulatory Visit: Payer: Self-pay | Admitting: Cardiology

## 2019-12-03 NOTE — Telephone Encounter (Signed)
Prescription refill request for Eliquis received.  Last office visit: Turner, 09/13/2019 Scr: 0.98, 06/16/2019 Age: 74 y.o. Weight: 79.4 kg   Prescription refill sent.

## 2020-01-22 ENCOUNTER — Ambulatory Visit: Payer: Medicare Other

## 2020-03-20 ENCOUNTER — Encounter: Payer: Self-pay | Admitting: Cardiology

## 2020-03-20 ENCOUNTER — Other Ambulatory Visit: Payer: Self-pay

## 2020-03-20 ENCOUNTER — Ambulatory Visit (INDEPENDENT_AMBULATORY_CARE_PROVIDER_SITE_OTHER): Payer: Medicare Other | Admitting: Cardiology

## 2020-03-20 VITALS — BP 146/72 | HR 68 | Ht 69.0 in | Wt 170.2 lb

## 2020-03-20 DIAGNOSIS — E7800 Pure hypercholesterolemia, unspecified: Secondary | ICD-10-CM

## 2020-03-20 DIAGNOSIS — I2510 Atherosclerotic heart disease of native coronary artery without angina pectoris: Secondary | ICD-10-CM | POA: Diagnosis not present

## 2020-03-20 DIAGNOSIS — I6523 Occlusion and stenosis of bilateral carotid arteries: Secondary | ICD-10-CM | POA: Diagnosis not present

## 2020-03-20 DIAGNOSIS — I480 Paroxysmal atrial fibrillation: Secondary | ICD-10-CM

## 2020-03-20 DIAGNOSIS — I10 Essential (primary) hypertension: Secondary | ICD-10-CM

## 2020-03-20 LAB — CBC
Hematocrit: 44.9 % (ref 37.5–51.0)
Hemoglobin: 15.5 g/dL (ref 13.0–17.7)
MCH: 31.9 pg (ref 26.6–33.0)
MCHC: 34.5 g/dL (ref 31.5–35.7)
MCV: 92 fL (ref 79–97)
Platelets: 251 10*3/uL (ref 150–450)
RBC: 4.86 x10E6/uL (ref 4.14–5.80)
RDW: 12.2 % (ref 11.6–15.4)
WBC: 8.5 10*3/uL (ref 3.4–10.8)

## 2020-03-20 LAB — COMPREHENSIVE METABOLIC PANEL
ALT: 19 IU/L (ref 0–44)
AST: 28 IU/L (ref 0–40)
Albumin/Globulin Ratio: 1.8 (ref 1.2–2.2)
Albumin: 4.4 g/dL (ref 3.7–4.7)
Alkaline Phosphatase: 111 IU/L (ref 44–121)
BUN/Creatinine Ratio: 16 (ref 10–24)
BUN: 13 mg/dL (ref 8–27)
Bilirubin Total: 0.5 mg/dL (ref 0.0–1.2)
CO2: 26 mmol/L (ref 20–29)
Calcium: 9.2 mg/dL (ref 8.6–10.2)
Chloride: 103 mmol/L (ref 96–106)
Creatinine, Ser: 0.81 mg/dL (ref 0.76–1.27)
GFR calc Af Amer: 103 mL/min/{1.73_m2} (ref >59)
GFR calc non Af Amer: 89 mL/min/{1.73_m2} (ref >59)
Globulin, Total: 2.4 g/dL (ref 1.5–4.5)
Glucose: 88 mg/dL (ref 65–99)
Potassium: 4.3 mmol/L (ref 3.5–5.2)
Sodium: 140 mmol/L (ref 134–144)
Total Protein: 6.8 g/dL (ref 6.0–8.5)

## 2020-03-20 LAB — LIPID PANEL
Chol/HDL Ratio: 2.4 ratio (ref 0.0–5.0)
Cholesterol, Total: 118 mg/dL (ref 100–199)
HDL: 49 mg/dL (ref >39)
LDL Chol Calc (NIH): 49 mg/dL (ref 0–99)
Triglycerides: 109 mg/dL (ref 0–149)
VLDL Cholesterol Cal: 20 mg/dL (ref 5–40)

## 2020-03-20 NOTE — Progress Notes (Signed)
Cardiology Office Note:    Date:  03/20/2020   ID:  Julian Weaver, DOB 08-28-1945, MRN [id number]  PCP:  Shon Hale, MD  Cardiologist:  Armanda Magic, MD    Referring MD: Shon Hale, *   Chief Complaint  Patient presents with  . Coronary Artery Disease  . Hypertension  . Hyperlipidemia    History of Present Illness:    Julian Weaver is a 74 y.o. male with a hx of CAD status post PCI/DESRCA in 2010, hypertension, hyperlipidemia, tobacco abuse, carotid bruits 1-39% dopplers 04/2017. He had a 2D echo which showed normal LVF with G1DD.  He also had a nuclear stress test that showed no ischemia.  He also has PAF and has been seen in afib clinic and placed on apixaban and Lopressor.   She is here today for followup and is doing well.  She denies any chest pain or pressure, SOB, DOE, PND, orthopnea, LE edema, dizziness, palpitations or syncope. She is compliant with her meds and is tolerating meds with no SE.    Past Medical History:  Diagnosis Date  . Allergic rhinitis   . Arthritis   . Carotid artery stenosis    1-39% bilateral dopplers 05/2019  . Coronary artery disease 04/13/2008   Drug eluting stent in RCA // Myoview 04/2019: EF 52, normal perfusion; low risk  . Hypercholesteremia   . Hypertension   . RBBB   . Renal disorder   . Tobacco abuse     Past Surgical History:  Procedure Laterality Date  . CAROTID STENT  2010  . SHOULDER ACROMIOPLASTY  2010    Current Medications: Current Meds  Medication Sig  . amLODipine (NORVASC) 5 MG tablet Take 5 mg by mouth daily.  [name] aspirin EC 81 MG tablet Take 1 tablet (81 mg total) by mouth daily.  [name] atorvastatin (LIPITOR) 20 MG tablet TAKE 1 TABLET(20 MG) BY MOUTH DAILY  . co-enzyme Q-10 30 MG capsule Take 100 mg by mouth daily.  [name] ELIQUIS 5 MG TABS tablet TAKE 1 TABLET(5 MG) BY MOUTH TWICE DAILY  . EPINEPHrine (EPIPEN IJ) Inject as directed as needed (bee stings).   . fluticasone (FLONASE)  50 MCG/ACT nasal spray Place 2 sprays into both nostrils daily as needed.   [name] HYDROcodone-acetaminophen (NORCO/VICODIN) 5-325 MG tablet Take 1 tablet by mouth every 6 (six) hours as needed for moderate pain.  . metoprolol tartrate (LOPRESSOR) 25 MG tablet Take 0.5 tablets (12.5 mg total) by mouth 2 (two) times daily. (Patient taking differently: Take 25 mg by mouth 2 (two) times daily.)  . omeprazole (PRILOSEC) 20 MG capsule Take 20 mg by mouth daily.  . [DISCONTINUED] amLODipine (NORVASC) 2.5 MG tablet Take 1 tablet (2.5 mg total) by mouth daily.   Current Facility-Administered Medications for the 03/20/20 encounter (Office Visit) with Quintella Reichert, MD  Medication  . triamcinolone acetonide (KENALOG) 10 MG/ML injection 10 mg     Allergies:   Bee venom and Amoxicillin-pot clavulanate   Social History   Socioeconomic History  . Marital status: Married    Spouse name: Not on file  . Number of children: Not on file  . Years of education: Not on file  . Highest education level: Not on file  Occupational History  . Not on file  Tobacco Use  . Smoking status: Current Every Day Smoker    Packs/day: 1.00    Years: 56.00    Pack years: 56.00    Types: Cigarettes  Start date: 10/27/1982  . Smokeless tobacco: Never Used  Substance and Sexual Activity  . Alcohol use: Yes    Alcohol/week: 1.0 standard drink    Types: 1 Cans of beer per week    Comment: Rarely- 3 times a month  . Drug use: No  . Sexual activity: Not on file  Other Topics Concern  . Not on file  Social History Narrative  . Not on file   Social Determinants of Health   Financial Resource Strain: Not on file  Food Insecurity: Not on file  Transportation Needs: Not on file  Physical Activity: Not on file  Stress: Not on file  Social Connections: Not on file     Family History: The patient's family history includes Stroke in his father and mother.  ROS:   Please see the history of present illness.    ROS   All other systems reviewed and negative.   EKGs/Labs/Other Studies Reviewed:    The following studies were reviewed today: EKG  EKG:  EKG is done today and showed   Recent Labs: 06/16/2019: ALT 18; BUN 13; Creatinine, Ser 0.98; Potassium 4.5; Sodium 138 09/13/2019: Hemoglobin 14.2   Recent Lipid Panel    Component Value Date/Time   CHOL 99 (L) 06/16/2019 0844   TRIG 68 06/16/2019 0844   HDL 46 06/16/2019 0844   CHOLHDL 2.2 06/16/2019 0844   CHOLHDL 2.5 05/24/2015 0816   VLDL 16 05/24/2015 0816   LDLCALC 38 06/16/2019 0844    Physical Exam:    VS:  BP (!) 146/72   Pulse 68   Ht 5\' 9"  (1.753 m)   Wt 170 lb 3.2 oz (77.2 kg)   SpO2 95%   BMI 25.13 kg/m     Wt Readings from Last 3 Encounters:  03/20/20 170 lb 3.2 oz (77.2 kg)  09/13/19 175 lb (79.4 kg)  07/14/19 170 lb (77.1 kg)     GEN: Well nourished, well developed in no acute distress HEENT: Normal NECK: No JVD; No carotid bruits LYMPHATICS: No lymphadenopathy CARDIAC:RRR, no murmurs, rubs, gallops RESPIRATORY:  Clear to auscultation without rales, wheezing or rhonchi  ABDOMEN: Soft, non-tender, non-distended MUSCULOSKELETAL:  No edema; No deformity  SKIN: Warm and dry NEUROLOGIC:  Alert and oriented x 3 PSYCHIATRIC:  Normal affect    ASSESSMENT:    1. Paroxysmal atrial fibrillation (HCC)   3. Bilateral carotid artery stenosis   4. Essential hypertension, benign   5. Pure hypercholesterolemia    PLAN:    In order of problems listed above:  1.  PAF -he continues to maintain NSR with no palpitations -he has not had any bleeding issues on DOAC -Continue Lopresssor 25mg  BID and apixaban 5mg  BID -check BMET and CBC  2.  Bilateral carotid artery stenosis -1-39% bilateral in 05/2019 by dopplers   -Repeat 06/2020 -continue statin -no ASA due to DOAC  3.  HTN -BP is borderline controlled on exam today but he has not had any meds this am yet -continue amlodipine 5mg  daily and Lopressor 12.5mg   BID  4.  HLD -LDL goal < 70 -LDL was 38 in March 2021 -repeat FLP and ALT -continue atorvastatin 20mg  daily  5.  ASCAD -status post PCI/DESRCA in 2010 -he has not had any anginal symptoms since I saw him last -continue ASA, BB and statin    Medication Adjustments/Labs and Tests Ordered: Current medicines are reviewed at length with the patient today.  Concerns regarding medicines are outlined above.  No orders of  the defined types were placed in this encounter.  No orders of the defined types were placed in this encounter.   Signed, Armanda Magic, MD  03/20/2020 8:19 AM    [facility]

## 2020-03-20 NOTE — Patient Instructions (Addendum)
Medication Instructions:  Your physician recommends that you continue on your current medications as directed. Please refer to the Current Medication list given to you today.  *If you need a refill on your cardiac medications before your next appointment, please call your pharmacy*  Lab Work: TODAY: CBC, Lipids, CMET *If you have labs (blood work) drawn today and your tests are completely normal, you will receive your results only by:  MyChart Message (if you have MyChart) OR  A paper copy in the mail If you have any lab test that is abnormal or we need to change your treatment, we will call you to review the results.  Testing/Procedures: Your physician has requested that you have a carotid duplex. This test is an ultrasound of the carotid arteries in your neck. It looks at blood flow through these arteries that supply the brain with blood. Allow one hour for this exam. There are no restrictions or special instructions.  Follow-Up: At [facility], you and your health needs are our priority.  As part of our continuing mission to provide you with exceptional heart care, we have created designated Provider Care Teams.  These Care Teams include your primary Cardiologist (physician) and Advanced Practice Providers (APPs -  Physician Assistants and Nurse Practitioners) who all work together to provide you with the care you need, when you need it.  Your next appointment:   6 month(s)  The format for your next appointment:   In Person  Provider:   You may see Armanda Magic, MD or one of the following Advanced Practice Providers on your designated Care Team:    Ronie Spies, PA-C  Jacolyn Reedy, PA-C

## 2020-03-20 NOTE — Addendum Note (Signed)
Addended by: Theresia Majors on: 03/20/2020 08:27 AM   Modules accepted: Orders

## 2020-05-01 ENCOUNTER — Other Ambulatory Visit: Payer: Self-pay | Admitting: Cardiology

## 2020-05-31 ENCOUNTER — Other Ambulatory Visit: Payer: Self-pay | Admitting: Cardiology

## 2020-05-31 NOTE — Telephone Encounter (Signed)
Prescription refill request for Eliquis received. Indication: Afib Last office visit: Turner, 03/20/2020 Scr: 0.81, 03/20/2020 Age: 75 yo  Weight: 77.2 kg   Pt is on the correct dose of Eliquis per dosing criteria, prescription refill sent for Eliquis 5mg  Bid.

## 2020-06-26 ENCOUNTER — Other Ambulatory Visit: Payer: Self-pay

## 2020-06-26 ENCOUNTER — Encounter: Payer: Self-pay | Admitting: Cardiology

## 2020-06-26 ENCOUNTER — Ambulatory Visit (HOSPITAL_COMMUNITY)
Admission: RE | Admit: 2020-06-26 | Discharge: 2020-06-26 | Disposition: A | Discharge status: Home / Self Care | Payer: Medicare Other | Source: Ambulatory Visit | Attending: Cardiology | Admitting: Cardiology

## 2020-06-26 DIAGNOSIS — I6523 Occlusion and stenosis of bilateral carotid arteries: Secondary | ICD-10-CM | POA: Insufficient documentation

## 2020-06-27 ENCOUNTER — Ambulatory Visit (INDEPENDENT_AMBULATORY_CARE_PROVIDER_SITE_OTHER): Payer: Medicare Other | Admitting: Podiatry

## 2020-06-27 DIAGNOSIS — L84 Corns and callosities: Secondary | ICD-10-CM | POA: Diagnosis not present

## 2020-06-27 DIAGNOSIS — M2041 Other hammer toe(s) (acquired), right foot: Secondary | ICD-10-CM | POA: Diagnosis not present

## 2020-06-27 NOTE — Progress Notes (Signed)
  Subjective:  Patient ID: Julian Weaver, male    DOB: 11-16-1945,  MRN: [id number]  No chief complaint on file.   75 y.o. male presents with the above complaint. History confirmed with patient. States a recurrence of painful 5th toe corn, hurting for 2 weeks. Denies redness, has had some swelling. States he has tried shaving it down over the past few weeks.  Objective:  Physical Exam: warm, good capillary refill, no trophic changes or ulcerative lesions, normal DP and PT pulses and normal sensory exam. 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 5th toe PIPJ corn without ulceration   Assessment:   1. Hammer toe of right foot   2. Pre-ulcerative calluses     Plan:  Patient was evaluated and treated and all questions answered.  Hammertoe -Educated on etiology of deformity -Discussed padding and shoe gear changes  -Courtesy debridement of lesion -Dispsensed silicone toe caps.  Return if symptoms worsen or fail to improve.

## 2020-06-28 DIAGNOSIS — H2513 Age-related nuclear cataract, bilateral: Secondary | ICD-10-CM | POA: Diagnosis not present

## 2020-08-01 ENCOUNTER — Other Ambulatory Visit: Payer: Self-pay | Admitting: Cardiology

## 2020-08-03 ENCOUNTER — Telehealth: Payer: Self-pay | Admitting: *Deleted

## 2020-08-09 ENCOUNTER — Encounter: Payer: Self-pay | Admitting: Acute Care

## 2020-08-09 DIAGNOSIS — L819 Disorder of pigmentation, unspecified: Secondary | ICD-10-CM | POA: Diagnosis not present

## 2020-08-09 DIAGNOSIS — R42 Dizziness and giddiness: Secondary | ICD-10-CM | POA: Diagnosis not present

## 2020-08-09 DIAGNOSIS — I2510 Atherosclerotic heart disease of native coronary artery without angina pectoris: Secondary | ICD-10-CM | POA: Diagnosis not present

## 2020-08-18 ENCOUNTER — Telehealth: Payer: Self-pay | Admitting: Cardiology

## 2020-08-18 DIAGNOSIS — R55 Syncope and collapse: Secondary | ICD-10-CM

## 2020-08-18 DIAGNOSIS — I480 Paroxysmal atrial fibrillation: Secondary | ICD-10-CM

## 2020-08-18 NOTE — Telephone Encounter (Signed)
I received a call from Mary-Kathryn, Dr. Christena Flake (pcp) nurse, regarding patient's condition. She states the patient was seen in their office on 08/09/20 and reported syncopal episodes and dizziness. Patient passes out after walking moderate distances. No SOB reported. She states the patient normally minimizes his symptoms, so it is concerning that he is now verbalizing them. Orthostatic vitals were taken on 05/18: BP- 135/51 HR- 69 Dr. Chanetta Marshall also reviewed patient's holter monitor results and assumes patient is having arrhythmic issues. She is considering referring patient to the [facility] for workup, but would like to ensure Dr. Mayford Knife agrees with plan.  Patient is scheduled to see Dr. Mayford Knife on 09/05/20. Mary-Kathryn states they faxed a copy of the office visit notes to our office.

## 2020-08-18 NOTE — Telephone Encounter (Signed)
Left detailed message for nurse.  Advised Dr. Mayford Knife is off today and next Monday.  Advised that Dr. Mayford Knife would probably agree that Pt needs to be seen in the afib clinic.  Advised PCP note had not arrived yet, requested refax.  Left this nurse direct # for call back if any further questions.

## 2020-08-22 ENCOUNTER — Other Ambulatory Visit: Payer: Self-pay | Admitting: *Deleted

## 2020-08-22 DIAGNOSIS — F17210 Nicotine dependence, cigarettes, uncomplicated: Secondary | ICD-10-CM

## 2020-08-22 DIAGNOSIS — Z87891 Personal history of nicotine dependence: Secondary | ICD-10-CM

## 2020-08-23 NOTE — Telephone Encounter (Signed)
Spoke with the patient and advised him that Dr. Mayford Knife would like him referred to EP. Patient verbalized understanding. Referral has been placed.

## 2020-08-30 ENCOUNTER — Encounter: Payer: Self-pay | Admitting: Radiology

## 2020-08-30 NOTE — Addendum Note (Signed)
Addended by: Theresia Majors on: 08/30/2020 09:45 AM   Modules accepted: Orders

## 2020-08-30 NOTE — Telephone Encounter (Signed)
Called [facility] to obtain holter monitor results. They do not have any records of a holter monitor. Office visit note was faxed over last week.  Per EP patient will need monitor results for pre-cert prior to loop recorder.

## 2020-08-30 NOTE — Telephone Encounter (Signed)
Spoke with the patient and advised him that Dr. Mayford Knife has ordered a heart monitor for him to wear. Also advised that he should not be driving and he should stay off high elevations. Patient verbalized understanding.

## 2020-08-30 NOTE — Progress Notes (Signed)
Enrolled patient for a 30 day [employer] Event Monitor to be mailed to patients home  

## 2020-08-31 ENCOUNTER — Other Ambulatory Visit (HOSPITAL_COMMUNITY): Payer: Self-pay | Admitting: Family Medicine

## 2020-08-31 DIAGNOSIS — L97319 Non-pressure chronic ulcer of right ankle with unspecified severity: Secondary | ICD-10-CM

## 2020-08-31 DIAGNOSIS — F17200 Nicotine dependence, unspecified, uncomplicated: Secondary | ICD-10-CM

## 2020-08-31 DIAGNOSIS — I2510 Atherosclerotic heart disease of native coronary artery without angina pectoris: Secondary | ICD-10-CM

## 2020-09-04 ENCOUNTER — Ambulatory Visit (HOSPITAL_COMMUNITY)
Admission: RE | Admit: 2020-09-04 | Discharge: 2020-09-04 | Disposition: A | Discharge status: Home / Self Care | Payer: Medicare Other | Source: Ambulatory Visit | Attending: Family Medicine | Admitting: Family Medicine

## 2020-09-04 ENCOUNTER — Other Ambulatory Visit: Payer: Self-pay

## 2020-09-04 DIAGNOSIS — I2510 Atherosclerotic heart disease of native coronary artery without angina pectoris: Secondary | ICD-10-CM | POA: Diagnosis not present

## 2020-09-04 DIAGNOSIS — F17200 Nicotine dependence, unspecified, uncomplicated: Secondary | ICD-10-CM | POA: Diagnosis not present

## 2020-09-04 DIAGNOSIS — L97319 Non-pressure chronic ulcer of right ankle with unspecified severity: Secondary | ICD-10-CM | POA: Insufficient documentation

## 2020-09-05 ENCOUNTER — Ambulatory Visit (INDEPENDENT_AMBULATORY_CARE_PROVIDER_SITE_OTHER): Payer: Medicare Other | Admitting: Cardiology

## 2020-09-05 ENCOUNTER — Encounter: Payer: Self-pay | Admitting: Cardiology

## 2020-09-05 VITALS — BP 120/62 | HR 53 | Ht 69.0 in | Wt 171.2 lb

## 2020-09-05 DIAGNOSIS — I6523 Occlusion and stenosis of bilateral carotid arteries: Secondary | ICD-10-CM | POA: Diagnosis not present

## 2020-09-05 DIAGNOSIS — I10 Essential (primary) hypertension: Secondary | ICD-10-CM

## 2020-09-05 DIAGNOSIS — I480 Paroxysmal atrial fibrillation: Secondary | ICD-10-CM

## 2020-09-05 DIAGNOSIS — E7800 Pure hypercholesterolemia, unspecified: Secondary | ICD-10-CM | POA: Diagnosis not present

## 2020-09-05 DIAGNOSIS — R200 Anesthesia of skin: Secondary | ICD-10-CM

## 2020-09-05 DIAGNOSIS — I2510 Atherosclerotic heart disease of native coronary artery without angina pectoris: Secondary | ICD-10-CM

## 2020-09-05 MED ORDER — ATORVASTATIN CALCIUM 20 MG PO TABS: 20.0000 mg | ORAL_TABLET | Freq: Every day | ORAL | 1 refills | Status: DC

## 2020-09-05 MED ORDER — AMLODIPINE BESYLATE 5 MG PO TABS: ORAL_TABLET | ORAL | 1 refills | Status: DC

## 2020-09-05 MED ORDER — METOPROLOL TARTRATE 25 MG PO TABS: 25.0000 mg | ORAL_TABLET | Freq: Two times a day (BID) | ORAL | 1 refills | Status: DC

## 2020-09-05 MED ORDER — METOPROLOL TARTRATE 25 MG PO TABS: 12.5000 mg | ORAL_TABLET | Freq: Two times a day (BID) | ORAL | 3 refills | Status: DC

## 2020-09-05 MED ORDER — ELIQUIS 5 MG PO TABS: ORAL_TABLET | ORAL | 1 refills | Status: DC

## 2020-09-05 NOTE — Patient Instructions (Addendum)
Medication Instructions:  Your physician has recommended you make the following change in your medication:  1) DECREASE Lopressor (metoprolol tartrate) to 12.5 mg (1/2 tablet) twice daily  *If you need a refill on your cardiac medications before your next appointment, please call your pharmacy*   Testing/Procedures: Your physician has requested that you have an upper extremity arterial duplex. This test is an ultrasound of the arteries in the arms. It looks at arterial blood flow in the arms. Allow one hour for Upper Arterial scans. There are no restrictions or special instructions   Follow-Up: At [facility], you and your health needs are our priority.  As part of our continuing mission to provide you with exceptional heart care, we have created designated Provider Care Teams.  These Care Teams include your primary Cardiologist (physician) and Advanced Practice Providers (APPs -  Physician Assistants and Nurse Practitioners) who all work together to provide you with the care you need, when you need it.   Your next appointment:   6 month(s)  The format for your next appointment:   In Person  Provider:   You may see Armanda Magic, MD or one of the following Advanced Practice Providers on your designated Care Team:   Ronie Spies, PA-C Jacolyn Reedy, PA-C

## 2020-09-05 NOTE — Addendum Note (Signed)
Addended by: Theresia Majors on: 09/05/2020 09:41 AM   Modules accepted: Orders

## 2020-09-05 NOTE — Progress Notes (Signed)
Cardiology Office Note:    Date:  09/05/2020   ID:  Julian Weaver, DOB Jan 22, 1946, MRN [id number]  PCP:  Shon Hale, MD  Cardiologist:  Armanda Magic, MD    Referring MD: Shon Hale, *   Chief Complaint  Patient presents with   Follow-up    Dizziness, PAF, CAD, HTN, HLD, carotid stenosis     History of Present Illness:    Julian Weaver is a 75 y.o. male with a hx of CAD status post PCI/DES RCA in 2010, hypertension, hyperlipidemia, tobacco abuse, carotid bruits 1-39% dopplers 04/2017. He had a 2D echo which showed normal LVF with G1DD.  He also had a nuclear stress test that showed no ischemia.  He also has PAF and has been seen in afib clinic and placed on apixaban and Lopressor.   He is here today for followup.  He tells me that he has been having tingling and numbness that usually starts after he gets up and will last a few hours and then it will eventually resolve.  This has been going on for about 2 weeks.  The arm is also weak.  He feels knots in his neck and right shoulder as well.  Carotid dopplers showed disturbed flow in both subclavian arteries.    He also has been having problems with dizziness.  It will hit all of a sudden and he will feel like he is going to pass out and will get nauseated.  He also has noticed that he will feel his heart beating weird when he has the dizzy spells.  He just got a heart monitor to wear to try to determine if he is having an arrhythmia.  He had carotid dopplers recently that showed 1-39% bilateral carotid stenosis and normal vertebral arteries. He denies any chest pain or pressure, SOB, DOE, PND, orthopnea, LE edema,  palpitations or syncope. He is compliant with his meds and is tolerating meds with no SE.      Past Medical History:  Diagnosis Date   Allergic rhinitis    Arthritis    Carotid artery stenosis    1-39% bilateral dopplers 05/2020   Coronary artery disease 04/13/2008   Drug eluting stent  in RCA // Myoview 04/2019: EF 52, normal perfusion; low risk   Hypercholesteremia    Hypertension    RBBB    Renal disorder    Tobacco abuse     Past Surgical History:  Procedure Laterality Date   CAROTID STENT  2010   SHOULDER ACROMIOPLASTY  2010    Current Medications: Current Meds  Medication Sig   amLODipine (NORVASC) 5 MG tablet TAKE 1 TABLET(5 MG) BY MOUTH DAILY   aspirin EC 81 MG tablet Take 1 tablet (81 mg total) by mouth daily.   atorvastatin (LIPITOR) 20 MG tablet TAKE 1 TABLET(20 MG) BY MOUTH DAILY   ELIQUIS 5 MG TABS tablet TAKE 1 TABLET(5 MG) BY MOUTH TWICE DAILY   EPINEPHrine (EPIPEN IJ) Inject as directed as needed (bee stings).    fluticasone (FLONASE) 50 MCG/ACT nasal spray Place 2 sprays into both nostrils daily as needed.    HYDROcodone-acetaminophen (NORCO/VICODIN) 5-325 MG tablet Take 1 tablet by mouth every 6 (six) hours as needed for moderate pain.   metoprolol tartrate (LOPRESSOR) 25 MG tablet Take 25 mg by mouth 2 (two) times daily.   omeprazole (PRILOSEC) 20 MG capsule Take 20 mg by mouth daily.   Current Facility-Administered Medications for the 09/05/20 encounter (Office Visit) with  Quintella Reichert, MD  Medication   triamcinolone acetonide (KENALOG) 10 MG/ML injection 10 mg     Allergies:   Bee venom, Amoxicillin-pot clavulanate, Hydromorphone, and Tramadol   Social History   Socioeconomic History   Marital status: Married    Spouse name: Not on file   Number of children: Not on file   Years of education: Not on file   Highest education level: Not on file  Occupational History   Not on file  Tobacco Use   Smoking status: Every Day    Packs/day: 1.00    Years: 56.00    Pack years: 56.00    Types: Cigarettes    Start date: 10/27/1982   Smokeless tobacco: Never  Substance and Sexual Activity   Alcohol use: Yes    Alcohol/week: 1.0 standard drink    Types: 1 Cans of beer per week    Comment: Rarely- 3 times a month   Drug use: No   Sexual  activity: Not on file  Other Topics Concern   Not on file  Social History Narrative   Not on file   Social Determinants of Health   Financial Resource Strain: Not on file  Food Insecurity: Not on file  Transportation Needs: Not on file  Physical Activity: Not on file  Stress: Not on file  Social Connections: Not on file     Family History: The patient's family history includes Stroke in his father and mother.  ROS:   Please see the history of present illness.    ROS  All other systems reviewed and negative.   EKGs/Labs/Other Studies Reviewed:    The following studies were reviewed today: EKG  EKG:  EKG is done today and showed sins bradycardia at 53bpm with RBBB  Recent Labs: 03/20/2020: ALT 19; BUN 13; Creatinine, Ser 0.81; Hemoglobin 15.5; Platelets 251; Potassium 4.3; Sodium 140   Recent Lipid Panel    Component Value Date/Time   CHOL 118 03/20/2020 0831   TRIG 109 03/20/2020 0831   HDL 49 03/20/2020 0831   CHOLHDL 2.4 03/20/2020 0831   CHOLHDL 2.5 05/24/2015 0816   VLDL 16 05/24/2015 0816   LDLCALC 49 03/20/2020 0831    Physical Exam:    VS:  BP 120/62   Pulse (!) 53   Ht 5\' 9"  (1.753 m)   Wt 171 lb 3.2 oz (77.7 kg)   SpO2 98%   BMI 25.28 kg/m     Orthostatic VS for the past 24 hrs (Last 3 readings):  BP- Lying Pulse- Lying BP- Sitting Pulse- Sitting BP- Standing at 0 minutes Pulse- Standing at 0 minutes BP- Standing at 3 minutes Pulse- Standing at 3 minutes  09/05/20 0917 123/68 (!) 49 117/67 (!) 48 116/63 52 125/71 51     Wt Readings from Last 3 Encounters:  09/05/20 171 lb 3.2 oz (77.7 kg)  03/20/20 170 lb 3.2 oz (77.2 kg)  09/13/19 175 lb (79.4 kg)    GEN: Well nourished, well developed in no acute distress HEENT: Normal NECK: No JVD; No carotid bruits LYMPHATICS: No lymphadenopathy CARDIAC:RRR, no murmurs, rubs, gallops RESPIRATORY:  Clear to auscultation without rales, wheezing or rhonchi  ABDOMEN: Soft, non-tender,  non-distended MUSCULOSKELETAL:  No edema; No deformity  SKIN: Warm and dry NEUROLOGIC:  Alert and oriented x 3 PSYCHIATRIC:  Normal affect   ASSESSMENT:    1. Paroxysmal atrial fibrillation (HCC)   3. Bilateral carotid artery stenosis   4. Essential hypertension, benign   5. Pure hypercholesterolemia  PLAN:    In order of problems listed above:  1.  PAF -he is maintaining NSR On exam and denies any palpitations -he denies any bleeding problems on DOAC -Continue prescription drug management with Lopressor 25mg  BID and Apixaban 5mg  BID>>refills given for 6 months  -I have personally reviewed and interpreted outside labs performed by patient's PCP which showed SCr 1.01, K+ 3.9 and TSH 1.87   2.  Bilateral carotid artery stenosis -Repeat dopplers 06/2020 showed stable 1-39% bilateral stenosis -continue statin -he is not on ASA due to DOAC -no ASA due to DOAC  3.  HTN -his BP is controlled on exam today -Continue prescription drug management with amlodipine 5mg  BID and Lopressor 12.5mg  BID>>refills given for 1 months  4.  HLD -LDL goal < 70 -LDL was 49 in Dec 2021 -Continue prescription drug management with Atorvastatin 20mg  daily>>refills given for 6 months   5.  ASCAD -status post PCI/DES RCA in 2010 -he denies any anginal symptoms -continue ASA, BB and statin  6.  Dizziness -his symptoms are concerning for possible arrhythmia (he had a 5 beat run of WCT a year ago) -he is starting a heart monitor today to assess for tachy or brady arrhythmias -orthostatic BPs are normal on exam today -decrease lopressor to 12.5mg  BID due to low HR  7.  Right arm numbness and weakness -this seems to start up in the am and lasts a few hours and then subsides -? Carpel tunnel vs cervical disc vs. Subclavian artery stenosis -recent carotid dopplers showed disturbed flow in the subclavian artery -I will get a RUE arterial duplex   Medication Adjustments/Labs and Tests  Ordered: Current medicines are reviewed at length with the patient today.  Concerns regarding medicines are outlined above.  No orders of the defined types were placed in this encounter.  No orders of the defined types were placed in this encounter.   Signed, , MD  09/05/2020 9:07 AM    [facility]

## 2020-09-05 NOTE — Addendum Note (Signed)
Addended by: Madalyn Rob A on: 09/05/2020 04:47 PM   Modules accepted: Orders

## 2020-09-08 ENCOUNTER — Other Ambulatory Visit: Payer: Self-pay

## 2020-09-08 ENCOUNTER — Ambulatory Visit
Admission: RE | Admit: 2020-09-08 | Discharge: 2020-09-08 | Disposition: A | Discharge status: Home / Self Care | Payer: Medicare Other | Source: Ambulatory Visit | Attending: Acute Care | Admitting: Acute Care

## 2020-09-08 DIAGNOSIS — F17210 Nicotine dependence, cigarettes, uncomplicated: Secondary | ICD-10-CM

## 2020-09-08 DIAGNOSIS — Z87891 Personal history of nicotine dependence: Secondary | ICD-10-CM

## 2020-09-09 ENCOUNTER — Ambulatory Visit (INDEPENDENT_AMBULATORY_CARE_PROVIDER_SITE_OTHER): Payer: Medicare Other

## 2020-09-09 ENCOUNTER — Encounter: Payer: Self-pay | Admitting: Cardiology

## 2020-09-09 DIAGNOSIS — R55 Syncope and collapse: Secondary | ICD-10-CM | POA: Diagnosis not present

## 2020-09-09 DIAGNOSIS — I480 Paroxysmal atrial fibrillation: Secondary | ICD-10-CM

## 2020-09-10 DIAGNOSIS — I480 Paroxysmal atrial fibrillation: Secondary | ICD-10-CM | POA: Diagnosis not present

## 2020-09-10 DIAGNOSIS — R55 Syncope and collapse: Secondary | ICD-10-CM | POA: Diagnosis not present

## 2020-09-11 DIAGNOSIS — Z85828 Personal history of other malignant neoplasm of skin: Secondary | ICD-10-CM | POA: Diagnosis not present

## 2020-09-11 DIAGNOSIS — L821 Other seborrheic keratosis: Secondary | ICD-10-CM | POA: Diagnosis not present

## 2020-09-11 DIAGNOSIS — L570 Actinic keratosis: Secondary | ICD-10-CM | POA: Diagnosis not present

## 2020-09-11 DIAGNOSIS — L300 Nummular dermatitis: Secondary | ICD-10-CM | POA: Diagnosis not present

## 2020-09-11 DIAGNOSIS — D692 Other nonthrombocytopenic purpura: Secondary | ICD-10-CM | POA: Diagnosis not present

## 2020-09-11 DIAGNOSIS — L814 Other melanin hyperpigmentation: Secondary | ICD-10-CM | POA: Diagnosis not present

## 2020-09-12 ENCOUNTER — Other Ambulatory Visit: Payer: Self-pay | Admitting: Cardiology

## 2020-09-12 DIAGNOSIS — R42 Dizziness and giddiness: Secondary | ICD-10-CM

## 2020-09-12 DIAGNOSIS — R200 Anesthesia of skin: Secondary | ICD-10-CM

## 2020-09-19 ENCOUNTER — Ambulatory Visit (HOSPITAL_COMMUNITY)
Admission: RE | Admit: 2020-09-19 | Discharge: 2020-09-19 | Disposition: A | Discharge status: Home / Self Care | Payer: Medicare Other | Source: Ambulatory Visit | Attending: Internal Medicine | Admitting: Internal Medicine

## 2020-09-19 ENCOUNTER — Other Ambulatory Visit: Payer: Self-pay

## 2020-09-19 ENCOUNTER — Other Ambulatory Visit: Payer: Self-pay | Admitting: *Deleted

## 2020-09-19 ENCOUNTER — Ambulatory Visit (HOSPITAL_BASED_OUTPATIENT_CLINIC_OR_DEPARTMENT_OTHER)
Admission: RE | Admit: 2020-09-19 | Discharge: 2020-09-19 | Disposition: A | Discharge status: Home / Self Care | Payer: Medicare Other | Source: Ambulatory Visit | Attending: Internal Medicine | Admitting: Internal Medicine

## 2020-09-19 DIAGNOSIS — Z87891 Personal history of nicotine dependence: Secondary | ICD-10-CM

## 2020-09-19 DIAGNOSIS — R200 Anesthesia of skin: Secondary | ICD-10-CM | POA: Insufficient documentation

## 2020-09-19 DIAGNOSIS — R42 Dizziness and giddiness: Secondary | ICD-10-CM

## 2020-09-19 DIAGNOSIS — F17210 Nicotine dependence, cigarettes, uncomplicated: Secondary | ICD-10-CM

## 2020-09-19 NOTE — Progress Notes (Signed)
Please call patient and let them  know their  low dose Ct was read as a Lung RADS 1, negative study: no nodules or definitely benign nodules. Radiology recommendation is for a repeat LDCT in 12 months. .Please let them  know we will order and schedule their  annual screening scan for 08/2021. Please let them  know there was notation of CAD on their  scan.  Please remind the patient  that this is a non-gated exam therefore degree or severity of disease  cannot be determined. Please have them  follow up with their PCP regarding potential risk factor modification, dietary therapy or pharmacologic therapy if clinically indicated. Pt.  is  currently on statin therapy. Please place order for annual  screening scan for  08/2021 and fax results to PCP. Thanks so much.  Pt has CAD , aortic atherosclerosis and Emphysema. He has had a recent echo. He is on a statin. Just make sure he is aware.,and shares with his PCP.  Thanks so much.

## 2020-10-09 ENCOUNTER — Telehealth: Payer: Self-pay | Admitting: Cardiology

## 2020-10-09 DIAGNOSIS — I480 Paroxysmal atrial fibrillation: Secondary | ICD-10-CM

## 2020-10-09 NOTE — Telephone Encounter (Signed)
AF clinic referral placed

## 2020-10-09 NOTE — Telephone Encounter (Signed)
Monitor strip received showing AFib/Flutter with a rate of 137-200bpm.  This occurred 10/07/20 at 9:33pm CST.  Pt denies any symptoms other than he did feel his heart racing.  Pt states he took his Metoprolol and a little while later it broke.  Today was the last day of his monitor so pt has already removed monitor.  Advised I will speak with DOD and would only call back if he needed to make any changes.  Spoke with Dr. Eden Emms.  No changes.

## 2020-10-09 NOTE — Addendum Note (Signed)
Addended by: Julio Sicks on: 10/09/2020 11:26 AM   Modules accepted: Orders

## 2020-10-16 ENCOUNTER — Telehealth: Payer: Self-pay | Admitting: Cardiology

## 2020-10-16 DIAGNOSIS — I480 Paroxysmal atrial fibrillation: Secondary | ICD-10-CM

## 2020-10-16 NOTE — Telephone Encounter (Signed)
Pt is following up on CARDIAC TELEMETRY MONITORING-INTERPRETATION results...[name] please advise

## 2020-10-16 NOTE — Telephone Encounter (Signed)
Called patient reviewed results and MD recommendations.  Order placed for echo scheduled for 10/31/20 at 0720 told to arrive 10 minutes early.  Results routed to Rudi Coco, NP.  All questions answered.

## 2020-10-31 ENCOUNTER — Ambulatory Visit (HOSPITAL_COMMUNITY): Payer: Medicare Other | Attending: Internal Medicine

## 2020-10-31 ENCOUNTER — Telehealth: Payer: Self-pay | Admitting: Cardiology

## 2020-10-31 ENCOUNTER — Other Ambulatory Visit: Payer: Self-pay

## 2020-10-31 DIAGNOSIS — I480 Paroxysmal atrial fibrillation: Secondary | ICD-10-CM | POA: Diagnosis not present

## 2020-10-31 LAB — ECHOCARDIOGRAM COMPLETE
Area-P 1/2: 3.45 cm2
S' Lateral: 3.8 cm

## 2020-10-31 NOTE — Telephone Encounter (Signed)
Quintella Reichert, MD  10/31/2020  3:24 PM EDT      2D echo showed normal heart function with trivial leakiness of MV - stable froma year ago   The patient has been notified of the result and verbalized understanding.  All questions (if any) were answered. Theresia Majors, RN 10/31/2020 4:55 PM

## 2020-10-31 NOTE — Telephone Encounter (Signed)
Pt is returning call for results please advise  

## 2020-11-09 ENCOUNTER — Other Ambulatory Visit (HOSPITAL_COMMUNITY): Payer: Self-pay | Admitting: Nurse Practitioner

## 2020-11-09 ENCOUNTER — Ambulatory Visit (HOSPITAL_COMMUNITY)
Admission: RE | Admit: 2020-11-09 | Discharge: 2020-11-09 | Disposition: A | Discharge status: Home / Self Care | Payer: Medicare Other | Source: Ambulatory Visit | Attending: Nurse Practitioner | Admitting: Nurse Practitioner

## 2020-11-09 ENCOUNTER — Encounter (HOSPITAL_COMMUNITY): Payer: Self-pay | Admitting: Nurse Practitioner

## 2020-11-09 ENCOUNTER — Other Ambulatory Visit: Payer: Self-pay

## 2020-11-09 VITALS — BP 128/72 | HR 53 | Ht 69.0 in | Wt 172.6 lb

## 2020-11-09 DIAGNOSIS — F17210 Nicotine dependence, cigarettes, uncomplicated: Secondary | ICD-10-CM | POA: Insufficient documentation

## 2020-11-09 DIAGNOSIS — Z885 Allergy status to narcotic agent status: Secondary | ICD-10-CM | POA: Diagnosis not present

## 2020-11-09 DIAGNOSIS — I4510 Unspecified right bundle-branch block: Secondary | ICD-10-CM | POA: Insufficient documentation

## 2020-11-09 DIAGNOSIS — Z7901 Long term (current) use of anticoagulants: Secondary | ICD-10-CM | POA: Insufficient documentation

## 2020-11-09 DIAGNOSIS — I480 Paroxysmal atrial fibrillation: Secondary | ICD-10-CM | POA: Insufficient documentation

## 2020-11-09 DIAGNOSIS — Z79899 Other long term (current) drug therapy: Secondary | ICD-10-CM | POA: Insufficient documentation

## 2020-11-09 DIAGNOSIS — D6869 Other thrombophilia: Secondary | ICD-10-CM | POA: Diagnosis not present

## 2020-11-09 DIAGNOSIS — Z880 Allergy status to penicillin: Secondary | ICD-10-CM | POA: Diagnosis not present

## 2020-11-09 DIAGNOSIS — I4892 Unspecified atrial flutter: Secondary | ICD-10-CM | POA: Diagnosis not present

## 2020-11-09 DIAGNOSIS — Z7982 Long term (current) use of aspirin: Secondary | ICD-10-CM | POA: Diagnosis not present

## 2020-11-09 DIAGNOSIS — I2510 Atherosclerotic heart disease of native coronary artery without angina pectoris: Secondary | ICD-10-CM | POA: Insufficient documentation

## 2020-11-09 MED ORDER — MULTAQ 400 MG PO TABS: 400.0000 mg | ORAL_TABLET | Freq: Two times a day (BID) | ORAL | 3 refills | Status: DC

## 2020-11-09 MED ORDER — METOPROLOL TARTRATE 25 MG PO TABS: 12.5000 mg | ORAL_TABLET | Freq: Two times a day (BID) | ORAL | 3 refills | Status: DC

## 2020-11-09 NOTE — Progress Notes (Signed)
Primary Care Physician: Shon Hale, MD Referring Physician: Dr. Storm Frisk Seydou Julian Weaver is a 75 y.o. male with a h/o  CAD, HTN, Carotid disease, that is in the afib clinic for newly diagnosed afib. He has noticed for a while that he would have increased heart rate with presyncopal episodes. A 30 day holter monitor was recently placed. 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 Almost immediatly, he got a call from  the monitor company that told him he was out of rhythm and he was started on  metoprolol and eliquis for a CHA2DS2VASc of 4,  and felt  back to his baseline around 8 pm.  Strip showed afib vrs flutter with variable rate at 160 bpm.   He reports that he drinks one beer a week. He also smokes and drinks  around 8 cups of coffee a day, sometimes including a [employer] with an extra shot or two of caffeine.  He does snore and he wife has woke him up at night because he was not breathing. No previous sleep study. No further episodes noted since starting BB. He is still wearing the monitor for several more  weeks. He is pending a stress test.   F/u in afib clinic 11/09/20. I have not seen pt since !/2021. He saw Dr. Mayford Knife in June and was c/o palpitations again. Repeat monitor slowed paroxysmal afib. He is  on BB/eliquis   He has cut way back on caffeine, stopped alcohol. He has cut back on smoking. He has resumed BB and eliquis but is still having breakthrough afib. He had a stress test last year that was low risk and recent echo showed normal EF, normal left atrial size.   I discussed antiarrythmic's. Flecainide should be avoided 2/2 RBBB and CAD. He does not have issues with HR, EF normal range so discussed with him use of Multaq and he would like to try this.   Today, he denies symptoms of palpitations, chest pain, shortness of breath, orthopnea, PND, lower extremity edema, dizziness, presyncope, syncope, or neurologic sequela. The patient  is tolerating medications without difficulties and is otherwise without complaint today.   Past Medical History:  Diagnosis Date   Allergic rhinitis    Arthritis    Carotid artery stenosis    1-39% bilateral dopplers 05/2020   Coronary artery disease 04/13/2008   Drug eluting stent in RCA // Myoview 04/2019: EF 52, normal perfusion; low risk   Hypercholesteremia    Hypertension    RBBB    Renal disorder    Tobacco abuse    Past Surgical History:  Procedure Laterality Date   CAROTID STENT  2010   SHOULDER ACROMIOPLASTY  2010    Current Outpatient Medications  Medication Sig Dispense Refill   amLODipine (NORVASC) 5 MG tablet TAKE 1 TABLET(5 MG) BY MOUTH DAILY 90 tablet 1   apixaban (ELIQUIS) 5 MG TABS tablet TAKE 1 TABLET(5 MG) BY MOUTH TWICE DAILY 145 tablet 1   aspirin EC 81 MG tablet Take 1 tablet (81 mg total) by mouth daily. 90 tablet 3   atorvastatin (LIPITOR) 20 MG tablet Take 1 tablet (20 mg total) by mouth daily. 90 tablet 1   EPINEPHrine (EPIPEN IJ) Inject as directed as needed (bee stings).      fluticasone (FLONASE) 50 MCG/ACT nasal spray Place 2 sprays into both nostrils daily as needed.      HYDROcodone-acetaminophen (NORCO/VICODIN) 5-325 MG tablet Take  1 tablet by mouth every 6 (six) hours as needed for moderate pain. 30 tablet 0   metoprolol tartrate (LOPRESSOR) 25 MG tablet Take 0.5 tablets (12.5 mg total) by mouth 2 (two) times daily. (Patient taking differently: Take 25 mg by mouth 2 (two) times daily.) 90 tablet 3   omeprazole (PRILOSEC) 20 MG capsule Take 20 mg by mouth daily.     triamcinolone cream (KENALOG) 0.1 % Apply topically 2 (two) times daily.     Current Facility-Administered Medications  Medication Dose Route Frequency Provider Last Rate Last Admin   triamcinolone acetonide (KENALOG) 10 MG/ML injection 10 mg  10 mg Other Once Asencion Islam, DPM        Allergies  Allergen Reactions   Bee Venom Swelling   Amoxicillin-Pot Clavulanate Other (See  Comments)   Hydromorphone Other (See Comments)   Tramadol Other (See Comments)    Social History   Socioeconomic History   Marital status: Married    Spouse name: Not on file   Number of children: Not on file   Years of education: Not on file   Highest education level: Not on file  Occupational History   Not on file  Tobacco Use   Smoking status: Every Day    Packs/day: 1.00    Years: 56.00    Pack years: 56.00    Types: Cigarettes    Start date: 10/27/1982   Smokeless tobacco: Never   Tobacco comments:    Half-pack daily 11/09/2020  Substance and Sexual Activity   Alcohol use: Yes    Alcohol/week: 1.0 standard drink    Types: 1 Cans of beer per week    Comment: Rarely- 3 times a month   Drug use: No   Sexual activity: Not on file  Other Topics Concern   Not on file  Social History Narrative   Not on file   Social Determinants of Health   Financial Resource Strain: Not on file  Food Insecurity: Not on file  Transportation Needs: Not on file  Physical Activity: Not on file  Stress: Not on file  Social Connections: Not on file  Intimate Partner Violence: Not on file    Family History  Problem Relation Age of Onset   Stroke Mother    Stroke Father     ROS- All systems are reviewed and negative except as per the HPI above  Physical Exam: Vitals:   11/09/20 0832  BP: 128/72  Pulse: (!) 53  Weight: 78.3 kg  Height: 5\' 9"  (1.753 m)   Wt Readings from Last 3 Encounters:  11/09/20 78.3 kg  09/05/20 77.7 kg  03/20/20 77.2 kg    Labs: Lab Results  Component Value Date   NA 140 03/20/2020   K 4.3 03/20/2020   CL 103 03/20/2020   CO2 26 03/20/2020   GLUCOSE 88 03/20/2020   BUN 13 03/20/2020   CREATININE 0.81 03/20/2020   CALCIUM 9.2 03/20/2020   No results found for: INR Lab Results  Component Value Date   CHOL 118 03/20/2020   HDL 49 03/20/2020   LDLCALC 49 03/20/2020   TRIG 109 03/20/2020     GEN- The patient is well appearing, alert and  oriented x 3 today.   Head- normocephalic, atraumatic Eyes-  Sclera clear, conjunctiva pink Ears- hearing intact Oropharynx- clear Neck- supple, no JVP Lymph- no cervical lymphadenopathy Lungs- Clear to ausculation bilaterally, normal work of breathing Heart- Regular rate and rhythm, no murmurs, rubs or gallops, PMI not laterally displaced  GI- soft, NT, ND, + BS Extremities- no clubbing, cyanosis, or edema MS- no significant deformity or atrophy Skin- no rash or lesion Psych- euthymic mood, full affect Neuro- strength and sensation are intact  EKG- Echo-Left Ventricle: Left ventricular ejection fraction, by visual estimation,  is 55 to 60%. The left ventricle has normal function. The average left  ventricular global longitudinal strain is -20.2 %. The left ventricle has  no regional wall motion  abnormalities. There is no left ventricular hypertrophy. Left ventricular  diastolic parameters are consistent with Grade I diastolic dysfunction  (impaired relaxation). Indeterminate filling pressures.  Left Atrium: Left atrial size was normal in size  Monitor results- Predominant rhythm is normal sinus rhythm with average heart rate 60bpm and ranged from 46 to 206bpm. Paroxyxmal atrial fibrillation with RVR up to 206bpm with possible aberrancy Wide complex tachycardia up to 5 beats.    Assessment and Plan: 1.Paroxysmal  afib/flutter   Pt describes increased burden  Discussed with him using Multaq 400 mg bid to decrease afib burden and he would like to try this  Start Multaq 400 mg bid and have f/u EKG in office Tuesday  Decrease  metoprolol tartrate to 12.5 mg mg bid  Flecainide  contraindicated with RBBB and CAD If breaks thru Multaq then ablation can be considered   2. Lifestyle issues He has greatly cut back on caffeine Smoking cessation encouraged     3. CHA2DS2VASc score of at least 4 Bleeding precautions discussed  Continue eliquis 5 mg bid   4. CAD No anginal  symptoms per Dr. Mayford Knife   F/u in afib clinic  next week for EKG on start of Multaq Pending appointment with Dr. Mayford Knife 3/24    Elvina Sidle. [facility] [address] [place], [place] [address] [phone]

## 2020-11-14 ENCOUNTER — Encounter (HOSPITAL_COMMUNITY): Payer: Medicare Other | Admitting: Nurse Practitioner

## 2020-11-16 ENCOUNTER — Other Ambulatory Visit: Payer: Self-pay

## 2020-11-16 ENCOUNTER — Ambulatory Visit (HOSPITAL_COMMUNITY)
Admission: RE | Admit: 2020-11-16 | Discharge: 2020-11-16 | Disposition: A | Discharge status: Home / Self Care | Payer: Medicare Other | Source: Ambulatory Visit | Attending: Nurse Practitioner | Admitting: Nurse Practitioner

## 2020-11-16 VITALS — BP 112/56 | HR 53

## 2020-11-16 DIAGNOSIS — F17210 Nicotine dependence, cigarettes, uncomplicated: Secondary | ICD-10-CM | POA: Insufficient documentation

## 2020-11-16 DIAGNOSIS — I480 Paroxysmal atrial fibrillation: Secondary | ICD-10-CM

## 2020-11-16 DIAGNOSIS — H538 Other visual disturbances: Secondary | ICD-10-CM | POA: Diagnosis not present

## 2020-11-16 DIAGNOSIS — I4891 Unspecified atrial fibrillation: Secondary | ICD-10-CM | POA: Diagnosis not present

## 2020-11-16 DIAGNOSIS — D6869 Other thrombophilia: Secondary | ICD-10-CM

## 2020-11-16 NOTE — Progress Notes (Signed)
Pt in for ekg after starting Multaq 400 mg bid for increase in afib burden. He has not noted any further afib but is having a H/A about one hour in the am/Pm after taking the med. Mild blurred vision with this as well. He wants to stay on the drug to see if he can better tolerate. His EKG is unchanged form previous ekg.   I will go ahead and refer to EP to be considered for afib ablation if he cannot tolerate drug. He cannot use flecainide for CAD. He does smoke 1/2 pk a day for 30 yrs and encouraged him to stop smoking. L atrial size is normal. No alcohol. He does have vigorous work as he works in [employer].   If symptoms no better in the next 3-4 days then stop drug.

## 2021-01-01 ENCOUNTER — Other Ambulatory Visit: Payer: Self-pay

## 2021-01-01 ENCOUNTER — Ambulatory Visit (INDEPENDENT_AMBULATORY_CARE_PROVIDER_SITE_OTHER): Payer: Medicare Other | Admitting: Internal Medicine

## 2021-01-01 VITALS — BP 110/52 | HR 54 | Ht 69.0 in | Wt 174.6 lb

## 2021-01-01 DIAGNOSIS — D6869 Other thrombophilia: Secondary | ICD-10-CM

## 2021-01-01 DIAGNOSIS — I480 Paroxysmal atrial fibrillation: Secondary | ICD-10-CM

## 2021-01-01 NOTE — Progress Notes (Signed)
Electrophysiology Office Note   Date:  01/01/2021   ID:  Corey, Laski Sep 06, 1945, MRN [id number]  PCP:  Shon Hale, MD  Cardiologist:  Dr Mayford Knife Primary Electrophysiologist: Hillis Range, MD    CC: afib   History of Present Illness: Julian Weaver is a 75 y.o. male who presents today for electrophysiology evaluation.   He is referred by Dr Mayford Knife and Rudi Coco NP for EP consultation regarding atrial fibrillation. He reports having palpitations and dizziness for several months.  He wore an event monitor in 05/2019 which documented afib and atrial flutter as the cause.  He reports tachypalpitations and diaphoresis, lasting up to 45 minutes several times per month. He continued to have symptoms.  Repeat monitor 09/2020 revealed atrial fibrillation/ atrial flutter also. He was recently seen in the AF clinic and started on Multaq 11/09/2020 (note reviewed). He has done well since.  He feels that his AF is currently well controlled.  Today, he denies symptoms of palpitations, chest pain, shortness of breath, orthopnea, PND, lower extremity edema, claudication, dizziness, presyncope, syncope, bleeding, or neurologic sequela. The patient is tolerating medications without difficulties and is otherwise without complaint today.    Past Medical History:  Diagnosis Date   Allergic rhinitis    Arthritis    Carotid artery stenosis    1-39% bilateral dopplers 05/2020   Coronary artery disease 04/13/2008   Drug eluting stent in RCA // Myoview 04/2019: EF 52, normal perfusion; low risk   Hypercholesteremia    Hypertension    RBBB    Renal disorder    Tobacco abuse    Past Surgical History:  Procedure Laterality Date   CAROTID STENT  2010   SHOULDER ACROMIOPLASTY  2010     Current Outpatient Medications  Medication Sig Dispense Refill   amLODipine (NORVASC) 5 MG tablet TAKE 1 TABLET(5 MG) BY MOUTH DAILY 90 tablet 1   apixaban (ELIQUIS) 5 MG TABS  tablet TAKE 1 TABLET(5 MG) BY MOUTH TWICE DAILY 145 tablet 1   aspirin EC 81 MG tablet Take 1 tablet (81 mg total) by mouth daily. 90 tablet 3   atorvastatin (LIPITOR) 20 MG tablet Take 1 tablet (20 mg total) by mouth daily. 90 tablet 1   dronedarone (MULTAQ) 400 MG tablet Take 1 tablet (400 mg total) by mouth 2 (two) times daily with a meal. 60 tablet 3   EPINEPHrine (EPIPEN IJ) Inject as directed as needed (bee stings).      fluticasone (FLONASE) 50 MCG/ACT nasal spray Place 2 sprays into both nostrils daily as needed.      HYDROcodone-acetaminophen (NORCO/VICODIN) 5-325 MG tablet Take 1 tablet by mouth every 6 (six) hours as needed for moderate pain. 30 tablet 0   metoprolol tartrate (LOPRESSOR) 25 MG tablet Take 0.5 tablets (12.5 mg total) by mouth 2 (two) times daily. 90 tablet 3   omeprazole (PRILOSEC) 20 MG capsule Take 20 mg by mouth daily.     triamcinolone cream (KENALOG) 0.1 % Apply topically 2 (two) times daily.     Current Facility-Administered Medications  Medication Dose Route Frequency Provider Last Rate Last Admin   triamcinolone acetonide (KENALOG) 10 MG/ML injection 10 mg  10 mg Other Once Asencion Islam, DPM        Allergies:   Bee venom, Amoxicillin-pot clavulanate, Hydromorphone, and Tramadol   Social History:  The patient  reports that he has been smoking cigarettes. He started smoking about 38 years ago. He has a 56.00 pack-year  smoking history. He has never used smokeless tobacco. He reports current alcohol use of about 1.0 standard drink per week. He reports that he does not use drugs.   Family History:  The patient's  family history includes Stroke in his father and mother.    ROS:  Please see the history of present illness.   All other systems are personally reviewed and negative.    PHYSICAL EXAM: VS:  BP (!) 110/52   Pulse (!) 54   Ht 5\' 9"  (1.753 m)   Wt 174 lb 9.6 oz (79.2 kg)   SpO2 96%   BMI 25.78 kg/m  , BMI Body mass index is 25.78 kg/m. GEN:  Well nourished, well developed, in no acute distress HEENT: normal Neck: no JVD, carotid bruits, or masses Cardiac: RRR; no murmurs, rubs, or gallops,no edema  Respiratory:  clear to auscultation bilaterally, normal work of breathing GI: soft, nontender, nondistended, + BS MS: no deformity or atrophy Skin: warm and dry  Neuro:  Strength and sensation are intact Psych: euthymic mood, full affect  EKG:  EKG is ordered today. The ekg ordered today is personally reviewed and shows sinus bradycardia 54 bpm, PR 154 msec, QRS 138 msec (RBBB)  Echo 10/31/20- EF 55%  Recent Labs: 03/20/2020: ALT 19; BUN 13; Creatinine, Ser 0.81; Hemoglobin 15.5; Platelets 251; Potassium 4.3; Sodium 140  personally reviewed   Lipid Panel     Component Value Date/Time   CHOL 118 03/20/2020 0831   TRIG 109 03/20/2020 0831   HDL 49 03/20/2020 0831   CHOLHDL 2.4 03/20/2020 0831   CHOLHDL 2.5 05/24/2015 0816   VLDL 16 05/24/2015 0816   LDLCALC 49 03/20/2020 0831   personally reviewed   Wt Readings from Last 3 Encounters:  01/01/21 174 lb 9.6 oz (79.2 kg)  11/09/20 172 lb 9.6 oz (78.3 kg)  09/05/20 171 lb 3.2 oz (77.7 kg)      Other studies personally reviewed: Additional studies/ records that were reviewed today include: AF clnic notes, prior event monitors, ecgs  Review of the above records today demonstrates: as above   ASSESSMENT AND PLAN:  1.  Paroxysmal atrial fibrillation/ atrial flutter The patient has symptomatic, recurrent  atrial fibrillation and atrial flutter.  Chads2vasc score is 4.  he is anticoagulated with eliquis .  He has been started on multaq and is doing well.  Therapeutic strategies for afib/ atrial flutter including medicine (continued multaq) and ablation were discussed in detail with the patient today. Risk, benefits, and alternatives to EP study and radiofrequency ablation for afib were also discussed in detail today.  At this time, he reports that he is happy with multaq  and does not wish to proceed with ablation. He will contact my office if this changes.   Risks, benefits and potential toxicities for medications prescribed and/or refilled reviewed with patient today.    Follow-up:  AF clinic in 4 months     Signed, 09/07/20, MD  01/01/2021 9:17 AM     [facility] [address] [place] [id number] (office) [phone] (fax)

## 2021-01-01 NOTE — Patient Instructions (Addendum)
Medication Instructions:  Your physician recommends that you continue on your current medications as directed. Please refer to the Current Medication list given to you today. *If you need a refill on your cardiac medications before your next appointment, please call your pharmacy*  Lab Work: None. If you have labs (blood work) drawn today and your tests are completely normal, you will receive your results only by: MyChart Message (if you have MyChart) OR A paper copy in the mail If you have any lab test that is abnormal or we need to change your treatment, we will call you to review the results.  Testing/Procedures: None.  Follow-Up: At [facility], you and your health needs are our priority.  As part of our continuing mission to provide you with exceptional heart care, we have created designated Provider Care Teams.  These Care Teams include your primary Cardiologist (physician) and Advanced Practice Providers (APPs -  Physician Assistants and Nurse Practitioners) who all work together to provide you with the care you need, when you need it.  Your physician wants you to follow-up in: 4 months with the Afib Clinic. They will contact you to schedule.   We recommend signing up for the patient portal called "MyChart".  Sign up information is provided on this After Visit Summary.  MyChart is used to connect with patients for Virtual Visits (Telemedicine).  Patients are able to view lab/test results, encounter notes, upcoming appointments, etc.  Non-urgent messages can be sent to your provider as well.   To learn more about what you can do with MyChart, go to [email or web address].    Any Other Special Instructions Will Be Listed Below (If Applicable).

## 2021-01-08 DIAGNOSIS — Z23 Encounter for immunization: Secondary | ICD-10-CM | POA: Diagnosis not present

## 2021-02-06 ENCOUNTER — Other Ambulatory Visit (HOSPITAL_BASED_OUTPATIENT_CLINIC_OR_DEPARTMENT_OTHER): Payer: Self-pay | Admitting: Orthopaedic Surgery

## 2021-02-06 ENCOUNTER — Ambulatory Visit (INDEPENDENT_AMBULATORY_CARE_PROVIDER_SITE_OTHER): Payer: Medicare Other | Admitting: Orthopaedic Surgery

## 2021-02-06 ENCOUNTER — Other Ambulatory Visit: Payer: Self-pay

## 2021-02-06 ENCOUNTER — Ambulatory Visit (HOSPITAL_BASED_OUTPATIENT_CLINIC_OR_DEPARTMENT_OTHER)
Admission: RE | Admit: 2021-02-06 | Discharge: 2021-02-06 | Disposition: A | Discharge status: Home / Self Care | Payer: Medicare Other | Source: Ambulatory Visit | Attending: Orthopaedic Surgery | Admitting: Orthopaedic Surgery

## 2021-02-06 ENCOUNTER — Other Ambulatory Visit (HOSPITAL_BASED_OUTPATIENT_CLINIC_OR_DEPARTMENT_OTHER): Payer: Self-pay

## 2021-02-06 DIAGNOSIS — M542 Cervicalgia: Secondary | ICD-10-CM | POA: Insufficient documentation

## 2021-02-06 DIAGNOSIS — S161XXA Strain of muscle, fascia and tendon at neck level, initial encounter: Secondary | ICD-10-CM

## 2021-02-06 MED ORDER — METHYLPREDNISOLONE 4 MG PO TBPK
ORAL_TABLET | ORAL | 0 refills | Status: DC
  Filled 2021-02-06: qty 21, 6d supply, fill #0

## 2021-02-06 MED ORDER — METHOCARBAMOL 500 MG PO TABS
500.0000 mg | ORAL_TABLET | Freq: Four times a day (QID) | ORAL | 2 refills | Status: DC
Start: 2021-02-06 — End: 2021-05-03
  Filled 2021-02-06: qty 30, 8d supply, fill #0

## 2021-02-06 MED ORDER — MELOXICAM 15 MG PO TABS
15.0000 mg | ORAL_TABLET | Freq: Every day | ORAL | 0 refills | Status: DC
Start: 2021-02-06 — End: 2022-05-08
  Filled 2021-02-06: qty 30, 30d supply, fill #0

## 2021-02-06 NOTE — Progress Notes (Signed)
Chief Complaint: neck pain     History of Present Illness:   Julian Weaver is a 75 y.o. male with neck pain going on for 2 days after he strained his neck at work.  He has not ever had pain in the significant.  He states that he is experiencing pain and spasm about the upper trapezius on both sides.  He did take a hydrocodone pill that he was previously given by Dr. Derry Lory as he has had such a severe time sleeping.  He denies any frank weakness or numbness.  He says that the pain is throbbing and sharp and also feels like it is stabbing.  He is in having an extremely difficult time with any range of motion about the neck.    Surgical History:   None  PMH/PSH/Family History/Social History/Meds/Allergies:    Past Medical History:  Diagnosis Date   Allergic rhinitis    Arthritis    Carotid artery stenosis    1-39% bilateral dopplers 05/2020   Coronary artery disease 04/13/2008   Drug eluting stent in RCA // Myoview 04/2019: EF 52, normal perfusion; low risk   Hypercholesteremia    Hypertension    RBBB    Renal disorder    Tobacco abuse    Past Surgical History:  Procedure Laterality Date   CAROTID STENT  2010   SHOULDER ACROMIOPLASTY  2010   Social History   Socioeconomic History   Marital status: Married    Spouse name: Not on file   Number of children: Not on file   Years of education: Not on file   Highest education level: Not on file  Occupational History   Not on file  Tobacco Use   Smoking status: Every Day    Packs/day: 1.00    Years: 56.00    Pack years: 56.00    Types: Cigarettes    Start date: 10/27/1982   Smokeless tobacco: Never   Tobacco comments:    Half-pack daily 11/09/2020  Substance and Sexual Activity   Alcohol use: Yes    Alcohol/week: 1.0 standard drink    Types: 1 Cans of beer per week    Comment: Rarely- 3 times a month   Drug use: No   Sexual activity: Not on file  Other Topics Concern   Not  on file  Social History Narrative   Not on file   Social Determinants of Health   Financial Resource Strain: Not on file  Food Insecurity: Not on file  Transportation Needs: Not on file  Physical Activity: Not on file  Stress: Not on file  Social Connections: Not on file   Family History  Problem Relation Age of Onset   Stroke Mother    Stroke Father    Allergies  Allergen Reactions   Bee Venom Swelling   Amoxicillin-Pot Clavulanate Other (See Comments)   Hydromorphone Other (See Comments)   Tramadol Other (See Comments)   Current Outpatient Medications  Medication Sig Dispense Refill   amLODipine (NORVASC) 5 MG tablet TAKE 1 TABLET(5 MG) BY MOUTH DAILY 90 tablet 1   apixaban (ELIQUIS) 5 MG TABS tablet TAKE 1 TABLET(5 MG) BY MOUTH TWICE DAILY 145 tablet 1   aspirin EC 81 MG tablet Take 1 tablet (81 mg total) by mouth daily. 90 tablet 3   atorvastatin (  LIPITOR) 20 MG tablet Take 1 tablet (20 mg total) by mouth daily. 90 tablet 1   dronedarone (MULTAQ) 400 MG tablet Take 1 tablet (400 mg total) by mouth 2 (two) times daily with a meal. 60 tablet 3   EPINEPHrine (EPIPEN IJ) Inject as directed as needed (bee stings).      fluticasone (FLONASE) 50 MCG/ACT nasal spray Place 2 sprays into both nostrils daily as needed.      HYDROcodone-acetaminophen (NORCO/VICODIN) 5-325 MG tablet Take 1 tablet by mouth every 6 (six) hours as needed for moderate pain. 30 tablet 0   metoprolol tartrate (LOPRESSOR) 25 MG tablet Take 0.5 tablets (12.5 mg total) by mouth 2 (two) times daily. 90 tablet 3   omeprazole (PRILOSEC) 20 MG capsule Take 20 mg by mouth daily.     triamcinolone cream (KENALOG) 0.1 % Apply topically 2 (two) times daily.     Current Facility-Administered Medications  Medication Dose Route Frequency Provider Last Rate Last Admin   triamcinolone acetonide (KENALOG) 10 MG/ML injection 10 mg  10 mg Other Once Asencion Islam, DPM       No results found.  Review of Systems:   A ROS  was performed including pertinent positives and negatives as documented in the HPI.  Physical Exam :   Constitutional: NAD and appears stated age Neurological: Alert and oriented Psych: Appropriate affect and cooperative There were no vitals taken for this visit.   Comprehensive Musculoskeletal Exam:    Tenderness to palpation about paratrapezial and paraspinal muscles throughout the upper neck.  No tenderness over midline spine.  No radiation to either shoulder.  Spurling test is difficult to assess given his significantly decreased range of motion and spasm.  Negative Hoffmann bilaterally.  He is got full strength with elbow flexion extension as well as wrist flexion extension bilaterally.  Sensation intact in all distributions of the upper extremity.  Imaging:   Xray (4 views cervical spine): Multilevel degenerative disc disease without obvious fracture    I personally reviewed and interpreted the radiographs.   Assessment:   75 year old male with acute cervical strain in the setting of known multilevel degenerative disc disease.  At this time I would like to treat his pain symptomatically with a combination of muscle relaxers as well as a Medrol Dosepak.  I will also plan to have him start taking Mobic as he finishes the Medrol Dosepak.  This would just be for short 30-day course.  I would also like him to be seen by physical therapy as I do believe that dry needling can significantly help with his muscle spasm and pain.  I would not like him to work on range of motion and strengthening however until his spasm and acute pain have subsided  Plan :    -He will follow-up with clinic as needed in 2 weeks should he not feel better      I personally saw and evaluated the patient, and participated in the management and treatment plan.  Huel Cote, MD Attending Physician, Orthopedic Surgery  This document was dictated using Dragon voice recognition software. A reasonable attempt  at proof reading has been made to minimize errors.

## 2021-02-08 DIAGNOSIS — I10 Essential (primary) hypertension: Secondary | ICD-10-CM | POA: Diagnosis not present

## 2021-02-08 DIAGNOSIS — R7301 Impaired fasting glucose: Secondary | ICD-10-CM | POA: Diagnosis not present

## 2021-02-08 DIAGNOSIS — I700 Atherosclerosis of aorta: Secondary | ICD-10-CM | POA: Diagnosis not present

## 2021-02-08 DIAGNOSIS — Z1211 Encounter for screening for malignant neoplasm of colon: Secondary | ICD-10-CM | POA: Diagnosis not present

## 2021-02-08 DIAGNOSIS — E7800 Pure hypercholesterolemia, unspecified: Secondary | ICD-10-CM | POA: Diagnosis not present

## 2021-02-08 DIAGNOSIS — M1991 Primary osteoarthritis, unspecified site: Secondary | ICD-10-CM | POA: Diagnosis not present

## 2021-02-08 DIAGNOSIS — K219 Gastro-esophageal reflux disease without esophagitis: Secondary | ICD-10-CM | POA: Diagnosis not present

## 2021-02-08 DIAGNOSIS — N521 Erectile dysfunction due to diseases classified elsewhere: Secondary | ICD-10-CM | POA: Diagnosis not present

## 2021-02-08 DIAGNOSIS — Z0000 Encounter for general adult medical examination without abnormal findings: Secondary | ICD-10-CM | POA: Diagnosis not present

## 2021-02-08 DIAGNOSIS — F17200 Nicotine dependence, unspecified, uncomplicated: Secondary | ICD-10-CM | POA: Diagnosis not present

## 2021-02-08 DIAGNOSIS — Z91030 Bee allergy status: Secondary | ICD-10-CM | POA: Diagnosis not present

## 2021-02-08 DIAGNOSIS — J449 Chronic obstructive pulmonary disease, unspecified: Secondary | ICD-10-CM | POA: Diagnosis not present

## 2021-02-14 ENCOUNTER — Telehealth: Payer: Self-pay | Admitting: Cardiology

## 2021-02-14 NOTE — Telephone Encounter (Signed)
Dawn from Dr. Christena Flake office said she faxed the lab work this morning [phone], Dr. Chanetta Marshall wants to know if Dr. Mayford Knife would like to lower the pts Statin medicine dosage due to very low LDL.  Dawn can be reached at [phone]

## 2021-02-14 NOTE — Telephone Encounter (Signed)
Left message for Dawn advising that Dr. Mayford Knife was out of the office today but I will get back to her next week when Dr. Mayford Knife returns and reviews the lab work.

## 2021-02-20 NOTE — Telephone Encounter (Signed)
Labs have been received and placed in Dr. Norris Cross folder for review.

## 2021-02-22 NOTE — Telephone Encounter (Signed)
Dawn from Dr. Christena Flake office calling to check on status.

## 2021-02-22 NOTE — Telephone Encounter (Signed)
Quintella Reichert, MD to Alois Cliche, RN     12:39 PM LDL is 28 but I think fine to continue on current dose of statin  Information from Dr Mayford Knife left on [employer].

## 2021-02-26 NOTE — Telephone Encounter (Signed)
Dawn returning call. She states she has not received a message.

## 2021-02-26 NOTE — Telephone Encounter (Signed)
Left message for [facility] with recommendations from Dr. Mayford Knife.

## 2021-03-01 ENCOUNTER — Other Ambulatory Visit (HOSPITAL_COMMUNITY): Payer: Self-pay | Admitting: Nurse Practitioner

## 2021-03-02 NOTE — Telephone Encounter (Signed)
Per Dr. Johney Frame, pt is still seen by A-Fib clinic, please address. Thanks

## 2021-03-21 DIAGNOSIS — Z23 Encounter for immunization: Secondary | ICD-10-CM | POA: Diagnosis not present

## 2021-03-27 ENCOUNTER — Other Ambulatory Visit: Payer: Self-pay | Admitting: Cardiology

## 2021-03-27 NOTE — Telephone Encounter (Signed)
Eliquis 5 mg refill request received. Patient is 76 years old, weight- 79.2 kg, Crea- 0.93 on 02/08/21, Diagnosis- PAF, and last seen by Dr. Johney Frame on 01/01/21. Dose is appropriate based on dosing criteria. Will send in refill to requested pharmacy.

## 2021-03-28 NOTE — Progress Notes (Signed)
.   Cardiology Office Note    Date:  04/03/2021   ID:  Julian Weaver, DOB 1945/09/22, MRN [id number]   PCP:  Glenis Smoker, MD   [place]  Cardiologist:  Fransico Him, MD   Advanced Practice Provider:  No care team member to display Electrophysiologist:  None   [phone]   Chief Complaint  Patient presents with   Follow-up    History of Present Illness:  Julian Weaver is a 76 y.o. male with a hx of CAD status post PCI/DES RCA in 2010, hypertension, hyperlipidemia, tobacco abuse, carotid bruits 1-39% dopplers 04/2017. He had a 2D echo which showed normal LVF with G1DD.  He also had a nuclear stress test that showed no ischemia.  He also has PAF and has been seen in afib clinic and placed on apixaban and Lopressor and Multaq.  Patient last saw Dr. Radford Pax 08/2020 and was having right arm numbness and weakness with recent Doppler showing subclavian artery stenosis.  She order right upper extremity arterial duplex which showed no obstruction.  Patient comes in for f/u. Last episode of Afib was 2 months ago. Denies chest pain, dyspnea, dizziness. Works Sports coach. Walks 1/2 mile 3-4 times/week. Has had blood in his stool on screening cards but he thinks it could be from nose bleeds. To have colonoscopy. Otherwise no bleeding problems. Taking ASA &  Eliquis.will stop ASA. Just had blood work done. Smoking 3/4 pk/day.    Past Medical History:  Diagnosis Date   Allergic rhinitis    Arthritis    Carotid artery stenosis    1-39% bilateral dopplers 05/2020   Coronary artery disease 04/13/2008   Drug eluting stent in RCA // Myoview 04/2019: EF 52, normal perfusion; low risk   Hypercholesteremia    Hypertension    RBBB    Renal disorder    Tobacco abuse     Past Surgical History:  Procedure Laterality Date   CAROTID STENT  2010   SHOULDER ACROMIOPLASTY  2010    Current Medications: Current Meds  Medication Sig    amLODipine (NORVASC) 5 MG tablet TAKE 1 TABLET(5 MG) BY MOUTH DAILY   atorvastatin (LIPITOR) 20 MG tablet Take 1 tablet (20 mg total) by mouth daily.   ELIQUIS 5 MG TABS tablet TAKE 1 TABLET(5 MG) BY MOUTH TWICE DAILY   EPINEPHrine (EPIPEN IJ) Inject as directed as needed (bee stings).    fluticasone (FLONASE) 50 MCG/ACT nasal spray Place 2 sprays into both nostrils daily as needed.    HYDROcodone-acetaminophen (NORCO/VICODIN) 5-325 MG tablet Take 1 tablet by mouth every 6 (six) hours as needed for moderate pain.   meloxicam (MOBIC) 15 MG tablet Take 1 tablet (15 mg total) by mouth daily.   metoprolol tartrate (LOPRESSOR) 25 MG tablet Take 0.5 tablets (12.5 mg total) by mouth 2 (two) times daily.   MULTAQ 400 MG tablet TAKE 1 TABLET(400 MG) BY MOUTH TWICE DAILY WITH A MEAL   Nicotine 21-14-7 MG/24HR KIT Place 1 patch onto the skin as directed.   omeprazole (PRILOSEC) 20 MG capsule Take 20 mg by mouth daily.   [DISCONTINUED] aspirin EC 81 MG tablet Take 1 tablet (81 mg total) by mouth daily.   Current Facility-Administered Medications for the 04/03/21 encounter (Office Visit) with Imogene Burn, PA-C  Medication   triamcinolone acetonide (KENALOG) 10 MG/ML injection 10 mg     Allergies:   Bee venom, Amoxicillin-pot clavulanate, Hydromorphone, and Tramadol   Social History  Socioeconomic History   Marital status: Married    Spouse name: Not on file   Number of children: Not on file   Years of education: Not on file   Highest education level: Not on file  Occupational History   Not on file  Tobacco Use   Smoking status: Every Day    Packs/day: 1.00    Years: 56.00    Pack years: 56.00    Types: Cigarettes    Start date: 10/27/1982   Smokeless tobacco: Never   Tobacco comments:    Half-pack daily 11/09/2020  Substance and Sexual Activity   Alcohol use: Yes    Alcohol/week: 1.0 standard drink    Types: 1 Cans of beer per week    Comment: Rarely- 3 times a month   Drug use:  No   Sexual activity: Not on file  Other Topics Concern   Not on file  Social History Narrative   Not on file   Social Determinants of Health   Financial Resource Strain: Not on file  Food Insecurity: Not on file  Transportation Needs: Not on file  Physical Activity: Not on file  Stress: Not on file  Social Connections: Not on file     Family History:  The patient's  family history includes Stroke in his father and mother.   ROS:   Please see the history of present illness.    ROS All other systems reviewed and are negative.   PHYSICAL EXAM:   VS:  BP 124/60    Pulse (!) 59    Ht _0  (1.753 m)    Wt 174 lb 6.4 oz (79.1 kg)    SpO2 96%    BMI 25.75 kg/m   Physical Exam  GEN: Well nourished, well developed, in no acute distress  Neck: Bilateral carotid bruits, no JVD, or masses Cardiac:RRR; no murmurs, rubs, or gallops  Respiratory: Decreased breath sounds but clear to auscultation bilaterally, normal work of breathing GI: soft, nontender, nondistended, + BS Ext: without cyanosis, clubbing, or edema, Good distal pulses bilaterally Neuro:  Alert and Oriented x 3 Psych: euthymic mood, full affect  Wt Readings from Last 3 Encounters:  04/03/21 174 lb 6.4 oz (79.1 kg)  01/01/21 174 lb 9.6 oz (79.2 kg)  11/09/20 172 lb 9.6 oz (78.3 kg)      Studies/Labs Reviewed:   EKG:  EKG is not ordered today.     Recent Labs: No results found for requested labs within last 8760 hours.   Lipid Panel    Component Value Date/Time   CHOL 118 03/20/2020 0831   TRIG 109 03/20/2020 0831   HDL 49 03/20/2020 0831   CHOLHDL 2.4 03/20/2020 0831   CHOLHDL 2.5 05/24/2015 0816   VLDL 16 05/24/2015 0816   LDLCALC 49 03/20/2020 0831    Additional studies/ records that were reviewed today include:  2D echo 10/31/2020 IMPRESSIONS     1. Left ventricular ejection fraction, by estimation, is 55 to 60%. The  left ventricle has normal function. Left ventricular endocardial border  not  optimally defined to evaluate regional wall motion. Left ventricular  diastolic parameters are consistent  with Grade I diastolic dysfunction (impaired relaxation). The average left  ventricular global longitudinal strain is -23.0 %. The global longitudinal  strain is normal.   2. Right ventricular systolic function is normal. The right ventricular  size is normal.   3. The mitral valve is grossly normal. Trivial mitral valve  regurgitation.   4. The  aortic valve is tricuspid. Aortic valve regurgitation is not  visualized.   5. The inferior vena cava is normal in size with greater than 50%  respiratory variability, suggesting right atrial pressure of 3 mmHg.   Comparison(s): No significant change from prior study. 04/27/19 EF 55-60%.  GLS -20.2%.    Risk Assessment/Calculations:    CHA2DS2-VASc Score = 3   This indicates a 3.2% annual risk of stroke. The patient's score is based upon: CHF History: 0 HTN History: 1 Diabetes History: 0 Stroke History: 0 Vascular Disease History: 1 Age Score: 1 Gender Score: 0        ASSESSMENT:    1. Paroxysmal atrial fibrillation (HCC)   2. Coronary artery disease involving native coronary artery of native heart without angina pectoris   3. Essential hypertension   4. Hyperlipidemia, unspecified hyperlipidemia type   5. Bilateral carotid artery stenosis      PLAN:  In order of problems listed above:  PAF on Eliquis metoprolol and Multaq followed by A. fib clinic, considering ablation-stop aspirin with recent bleeding in his bowels have a colonoscopy.  Request records of labs from PCP.  CAD status post DES to the RCA 2010 no angina continue Lipitor and metoprolol  Hypertension well-controlled  HLD LDL 28-11/17/22 on Lipitor 20 mg daily  Bilateral carotid stenosis stable on Dopplers 06/2020 no aspirin due to DOAC  Tobacco abuse smoking cessation discussed and willing to try nicotine patches  Shared Decision Making/Informed  Consent        Medication Adjustments/Labs and Tests Ordered: Current medicines are reviewed at length with the patient today.  Concerns regarding medicines are outlined above.  Medication changes, Labs and Tests ordered today are listed in the Patient Instructions below. Patient Instructions  Medication Instructions:  Your physician has recommended you make the following change in your medication:   START: Nicotine patch as directed DISCONTINUE: Aspirin  *If you need a refill on your cardiac medications before your next appointment, please call your pharmacy*   Lab Work: None If you have labs (blood work) drawn today and your tests are completely normal, you will receive your results only by: [place] (if you have MyChart) OR A paper copy in the mail If you have any lab test that is abnormal or we need to change your treatment, we will call you to review the results.   Follow-Up: At [facility], you and your health needs are our priority.  As part of our continuing mission to provide you with exceptional heart care, we have created designated Provider Care Teams.  These Care Teams include your primary Cardiologist (physician) and Advanced Practice Providers (APPs -  Physician Assistants and Nurse Practitioners) who all work together to provide you with the care you need, when you need it.  We recommend signing up for the patient portal called "MyChart".  Sign up information is provided on this After Visit Summary.  MyChart is used to connect with patients for Virtual Visits (Telemedicine).  Patients are able to view lab/test results, encounter notes, upcoming appointments, etc.  Non-urgent messages can be sent to your provider as well.   To learn more about what you can do with MyChart, go to [email or web address].    Your next appointment:   6 month(s)  The format for your next appointment:   In Person  Provider:   Fransico Him, MD     Other  Instructions Managing the Challenge of Quitting Smoking Quitting smoking is a physical and mental challenge.  You will face cravings, withdrawal symptoms, and temptation. Before quitting, work with your health care provider to make a plan that can help you manage quitting. Preparation can help you quit and keep you from giving in. How to manage lifestyle changes Managing stress Stress can make you want to smoke, and wanting to smoke may cause stress. It is important to find ways to manage your stress. You might try some of the following: Practice relaxation techniques. Breathe slowly and deeply, in through your nose and out through your mouth. Listen to music. Soak in a bath or take a shower. Imagine a peaceful place or vacation. Get some support. Talk with family or friends about your stress. Join a support group. Talk with a counselor or therapist. Get some physical activity. Go for a walk, run, or bike ride. Play a favorite sport. Practice yoga.  Medicines Talk with your health care provider about medicines that might help you deal with cravings and make quitting easier for you. Relationships Social situations can be difficult when you are quitting smoking. To manage this, you can: Avoid parties and other social situations where people might be smoking. Avoid alcohol. Leave right away if you have the urge to smoke. Explain to your family and friends that you are quitting smoking. Ask for support and let them know you might be a bit grumpy. Plan activities where smoking is not an option. General instructions Be aware that many people gain weight after they quit smoking. However, not everyone does. To keep from gaining weight, have a plan in place before you quit and stick to the plan after you quit. Your plan should include: Having healthy snacks. When you have a craving, it may help to: Eat popcorn, carrots, celery, or other cut vegetables. Chew sugar-free gum. Changing how you  eat. Eat small portion sizes at meals. Eat 4-6 small meals throughout the day instead of 1-2 large meals a day. Be mindful when you eat. Do not watch television or do other things that might distract you as you eat. Exercising regularly. Make time to exercise each day. If you do not have time for a long workout, do short bouts of exercise for 5-10 minutes several times a day. Do some form of strengthening exercise, such as weight lifting. Do some exercise that gets your heart beating and causes you to breathe deeply, such as walking fast, running, swimming, or biking. This is very important. Drinking plenty of water or other low-calorie or no-calorie drinks. Drink 6-8 glasses of water daily.  How to recognize withdrawal symptoms Your body and mind may experience discomfort as you try to get used to not having nicotine in your system. These effects are called withdrawal symptoms. They may include: Feeling hungrier than normal. Having trouble concentrating. Feeling irritable or restless. Having trouble sleeping. Feeling depressed. Craving a cigarette. To manage withdrawal symptoms: Avoid places, people, and activities that trigger your cravings. Remember why you want to quit. Get plenty of sleep. Avoid coffee and other caffeinated drinks. These may worsen some of your symptoms. These symptoms may surprise you. But be assured that they are normal to have when quitting smoking. How to manage cravings Come up with a plan for how to deal with your cravings. The plan should include the following: A definition of the specific situation you want to deal with. An alternative action you will take. A clear idea for how this action will help. The name of someone who might help you with this. Cravings usually  last for 5-10 minutes. Consider taking the following actions to help you with your plan to deal with cravings: Keep your mouth busy. Chew sugar-free gum. Suck on hard candies or a  straw. Brush your teeth. Keep your hands and body busy. Change to a different activity right away. Squeeze or play with a ball. Do an activity or a hobby, such as making bead jewelry, practicing needlepoint, or working with wood. Mix up your normal routine. Take a short exercise break. Go for a quick walk or run up and down stairs. Focus on doing something kind or helpful for someone else. Call a friend or family member to talk during a craving. Join a support group. Contact a quitline. Where to find support To get help or find a support group: Call the [employer]'s Smoking Quitline: 1-800-QUIT NOW 539-481-3103) Visit the website of the Substance Abuse and [place]: ktimeonline.com Text QUIT to SmokefreeTXT: 147829 Where to find more information Visit these websites to find more information on quitting smoking: [place]: www.smokefree.gov American Lung Association: www.lung.org American Cancer Society: www.cancer.org Centers for Disease Control and Prevention: http://www.wolf.info/ American Heart Association: www.heart.org Contact a health care provider if: You want to change your plan for quitting. The medicines you are taking are not helping. Your eating feels out of control or you cannot sleep. Get help right away if: You feel depressed or become very anxious. Summary Quitting smoking is a physical and mental challenge. You will face cravings, withdrawal symptoms, and temptation to smoke again. Preparation can help you as you go through these challenges. Try different techniques to manage stress, handle social situations, and prevent weight gain. You can deal with cravings by keeping your mouth busy (such as by chewing gum), keeping your hands and body busy, calling family or friends, or contacting a quitline for people who want to quit smoking. You can deal with withdrawal symptoms by avoiding places where people smoke, getting  plenty of rest, and avoiding drinks with caffeine. This information is not intended to replace advice given to you by your health care provider. Make sure you discuss any questions you have with your health care provider. Document Revised: 11/17/2020 Document Reviewed: 12/29/2018 Elsevier Patient Education  2022 [place], Ermalinda Barrios, [place]  04/03/2021 1:10 PM    [facility] [address], [place], [place]  [address] Phone: [phone]; Fax: [phone]

## 2021-03-29 DIAGNOSIS — D720 Genetic anomalies of leukocytes: Secondary | ICD-10-CM | POA: Diagnosis not present

## 2021-03-29 DIAGNOSIS — Z1211 Encounter for screening for malignant neoplasm of colon: Secondary | ICD-10-CM | POA: Diagnosis not present

## 2021-04-03 ENCOUNTER — Ambulatory Visit (INDEPENDENT_AMBULATORY_CARE_PROVIDER_SITE_OTHER): Payer: Medicare Other | Admitting: Physician Assistant

## 2021-04-03 ENCOUNTER — Other Ambulatory Visit: Payer: Self-pay

## 2021-04-03 ENCOUNTER — Encounter: Payer: Self-pay | Admitting: Physician Assistant

## 2021-04-03 VITALS — BP 124/60 | HR 59 | Ht 69.0 in | Wt 174.4 lb

## 2021-04-03 DIAGNOSIS — I10 Essential (primary) hypertension: Secondary | ICD-10-CM

## 2021-04-03 DIAGNOSIS — I480 Paroxysmal atrial fibrillation: Secondary | ICD-10-CM | POA: Diagnosis not present

## 2021-04-03 DIAGNOSIS — I2510 Atherosclerotic heart disease of native coronary artery without angina pectoris: Secondary | ICD-10-CM

## 2021-04-03 DIAGNOSIS — I6523 Occlusion and stenosis of bilateral carotid arteries: Secondary | ICD-10-CM

## 2021-04-03 DIAGNOSIS — E785 Hyperlipidemia, unspecified: Secondary | ICD-10-CM | POA: Diagnosis not present

## 2021-04-03 MED ORDER — NICOTINE 21-14-7 MG/24HR TD KIT: 1.0000 | PACK | TRANSDERMAL | 0 refills | Status: DC

## 2021-04-03 NOTE — Patient Instructions (Signed)
Medication Instructions:  Your physician has recommended you make the following change in your medication:   START: Nicotine patch as directed DISCONTINUE: Aspirin  *If you need a refill on your cardiac medications before your next appointment, please call your pharmacy*   Lab Work: None If you have labs (blood work) drawn today and your tests are completely normal, you will receive your results only by: MyChart Message (if you have MyChart) OR A paper copy in the mail If you have any lab test that is abnormal or we need to change your treatment, we will call you to review the results.   Follow-Up: At [facility], you and your health needs are our priority.  As part of our continuing mission to provide you with exceptional heart care, we have created designated Provider Care Teams.  These Care Teams include your primary Cardiologist (physician) and Advanced Practice Providers (APPs -  Physician Assistants and Nurse Practitioners) who all work together to provide you with the care you need, when you need it.  We recommend signing up for the patient portal called "MyChart".  Sign up information is provided on this After Visit Summary.  MyChart is used to connect with patients for Virtual Visits (Telemedicine).  Patients are able to view lab/test results, encounter notes, upcoming appointments, etc.  Non-urgent messages can be sent to your provider as well.   To learn more about what you can do with MyChart, go to [email or web address].    Your next appointment:   6 month(s)  The format for your next appointment:   In Person  Provider:   Armanda Magic, MD     Other Instructions Managing the Challenge of Quitting Smoking Quitting smoking is a physical and mental challenge. You will face cravings, withdrawal symptoms, and temptation. Before quitting, work with your health care provider to make a plan that can help you manage quitting. Preparation can help you quit and keep you from  giving in. How to manage lifestyle changes Managing stress Stress can make you want to smoke, and wanting to smoke may cause stress. It is important to find ways to manage your stress. You might try some of the following: Practice relaxation techniques. Breathe slowly and deeply, in through your nose and out through your mouth. Listen to music. Soak in a bath or take a shower. Imagine a peaceful place or vacation. Get some support. Talk with family or friends about your stress. Join a support group. Talk with a counselor or therapist. Get some physical activity. Go for a walk, run, or bike ride. Play a favorite sport. Practice yoga.  Medicines Talk with your health care provider about medicines that might help you deal with cravings and make quitting easier for you. Relationships Social situations can be difficult when you are quitting smoking. To manage this, you can: Avoid parties and other social situations where people might be smoking. Avoid alcohol. Leave right away if you have the urge to smoke. Explain to your family and friends that you are quitting smoking. Ask for support and let them know you might be a bit grumpy. Plan activities where smoking is not an option. General instructions Be aware that many people gain weight after they quit smoking. However, not everyone does. To keep from gaining weight, have a plan in place before you quit and stick to the plan after you quit. Your plan should include: Having healthy snacks. When you have a craving, it may help to: Eat popcorn, carrots, celery, or other  cut vegetables. Chew sugar-free gum. Changing how you eat. Eat small portion sizes at meals. Eat 4-6 small meals throughout the day instead of 1-2 large meals a day. Be mindful when you eat. Do not watch television or do other things that might distract you as you eat. Exercising regularly. Make time to exercise each day. If you do not have time for a long workout, do  short bouts of exercise for 5-10 minutes several times a day. Do some form of strengthening exercise, such as weight lifting. Do some exercise that gets your heart beating and causes you to breathe deeply, such as walking fast, running, swimming, or biking. This is very important. Drinking plenty of water or other low-calorie or no-calorie drinks. Drink 6-8 glasses of water daily.  How to recognize withdrawal symptoms Your body and mind may experience discomfort as you try to get used to not having nicotine in your system. These effects are called withdrawal symptoms. They may include: Feeling hungrier than normal. Having trouble concentrating. Feeling irritable or restless. Having trouble sleeping. Feeling depressed. Craving a cigarette. To manage withdrawal symptoms: Avoid places, people, and activities that trigger your cravings. Remember why you want to quit. Get plenty of sleep. Avoid coffee and other caffeinated drinks. These may worsen some of your symptoms. These symptoms may surprise you. But be assured that they are normal to have when quitting smoking. How to manage cravings Come up with a plan for how to deal with your cravings. The plan should include the following: A definition of the specific situation you want to deal with. An alternative action you will take. A clear idea for how this action will help. The name of someone who might help you with this. Cravings usually last for 5-10 minutes. Consider taking the following actions to help you with your plan to deal with cravings: Keep your mouth busy. Chew sugar-free gum. Suck on hard candies or a straw. Brush your teeth. Keep your hands and body busy. Change to a different activity right away. Squeeze or play with a ball. Do an activity or a hobby, such as making bead jewelry, practicing needlepoint, or working with wood. Mix up your normal routine. Take a short exercise break. Go for a quick walk or run up and down  stairs. Focus on doing something kind or helpful for someone else. Call a friend or family member to talk during a craving. Join a support group. Contact a quitline. Where to find support To get help or find a support group: Call the National Cancer Institute's Smoking Quitline: 1-800-QUIT NOW 704-755-5212) Visit the website of the Substance Abuse and Mental Health Services Administration: SkateOasis.com.pt Text QUIT to SmokefreeTXT: 034742 Where to find more information Visit these websites to find more information on quitting smoking: National Cancer Institute: www.smokefree.gov American Lung Association: www.lung.org American Cancer Society: www.cancer.org Centers for Disease Control and Prevention: FootballExhibition.com.br American Heart Association: www.heart.org Contact a health care provider if: You want to change your plan for quitting. The medicines you are taking are not helping. Your eating feels out of control or you cannot sleep. Get help right away if: You feel depressed or become very anxious. Summary Quitting smoking is a physical and mental challenge. You will face cravings, withdrawal symptoms, and temptation to smoke again. Preparation can help you as you go through these challenges. Try different techniques to manage stress, handle social situations, and prevent weight gain. You can deal with cravings by keeping your mouth busy (such as by chewing  gum), keeping your hands and body busy, calling family or friends, or contacting a quitline for people who want to quit smoking. You can deal with withdrawal symptoms by avoiding places where people smoke, getting plenty of rest, and avoiding drinks with caffeine. This information is not intended to replace advice given to you by your health care provider. Make sure you discuss any questions you have with your health care provider. Document Revised: 11/17/2020 Document Reviewed: 12/29/2018 Elsevier Patient Education  2022 [employer]

## 2021-04-26 ENCOUNTER — Other Ambulatory Visit: Payer: Self-pay | Admitting: Cardiology

## 2021-05-03 ENCOUNTER — Ambulatory Visit (HOSPITAL_COMMUNITY)
Admission: RE | Admit: 2021-05-03 | Discharge: 2021-05-03 | Disposition: A | Discharge status: Home / Self Care | Payer: Medicare Other | Source: Ambulatory Visit | Attending: Nurse Practitioner | Admitting: Nurse Practitioner

## 2021-05-03 ENCOUNTER — Encounter (HOSPITAL_COMMUNITY): Payer: Self-pay | Admitting: Nurse Practitioner

## 2021-05-03 ENCOUNTER — Other Ambulatory Visit: Payer: Self-pay

## 2021-05-03 VITALS — BP 120/64 | HR 51 | Ht 69.0 in | Wt 174.0 lb

## 2021-05-03 DIAGNOSIS — Z79899 Other long term (current) drug therapy: Secondary | ICD-10-CM | POA: Insufficient documentation

## 2021-05-03 DIAGNOSIS — I2510 Atherosclerotic heart disease of native coronary artery without angina pectoris: Secondary | ICD-10-CM | POA: Insufficient documentation

## 2021-05-03 DIAGNOSIS — Z7901 Long term (current) use of anticoagulants: Secondary | ICD-10-CM | POA: Diagnosis not present

## 2021-05-03 DIAGNOSIS — I4892 Unspecified atrial flutter: Secondary | ICD-10-CM | POA: Insufficient documentation

## 2021-05-03 DIAGNOSIS — I480 Paroxysmal atrial fibrillation: Secondary | ICD-10-CM | POA: Insufficient documentation

## 2021-05-03 DIAGNOSIS — D6869 Other thrombophilia: Secondary | ICD-10-CM | POA: Diagnosis not present

## 2021-05-03 DIAGNOSIS — F17210 Nicotine dependence, cigarettes, uncomplicated: Secondary | ICD-10-CM | POA: Insufficient documentation

## 2021-05-03 DIAGNOSIS — I10 Essential (primary) hypertension: Secondary | ICD-10-CM | POA: Diagnosis not present

## 2021-05-03 MED ORDER — METOPROLOL TARTRATE 25 MG PO TABS: 12.5000 mg | ORAL_TABLET | Freq: Two times a day (BID) | ORAL | Status: DC

## 2021-05-03 NOTE — Progress Notes (Signed)
Primary Care Physician: Glenis Smoker, MD Referring Physician: Dr. Berenice Primas Julian Weaver is a 76 y.o. male with a h/o  CAD, HTN, Carotid disease, that is in the afib clinic for newly diagnosed afib. He has noticed for a while that he would have increased heart rate with presyncopal episodes. A 30 day holter monitor was recently placed. 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 Almost immediatly, he got a call from  the monitor company that told him he was out of rhythm and he was started on  metoprolol and eliquis for a CHA2DS2VASc of 4,  and felt  back to his baseline around 8 pm.  Strip showed afib vrs flutter with variable rate at 160 bpm.   He reports that he drinks one beer a week. He also smokes and drinks  around 8 cups of coffee a day, sometimes including a Starbucks with an extra shot or two of caffeine.  He does snore and he wife has woke him up at night because he was not breathing. No previous sleep study. No further episodes noted since starting BB. He is still wearing the monitor for several more  weeks. He is pending a stress test.   F/u in afib clinic 11/09/20. I have not seen pt since !/2021. He saw Dr. Radford Pax in June and was c/o palpitations again. Repeat monitor slowed paroxysmal afib. He is  on BB/eliquis   He has cut way back on caffeine, stopped alcohol. He has cut back on smoking. He has resumed BB and eliquis but is still having breakthrough afib. He had a stress test last year that was low risk and recent echo showed normal EF, normal left atrial size.   I discussed antiarrythmic's. Flecainide should be avoided 2/2 RBBB and CAD. He does not have issues with HR, EF normal range so discussed with him use of Multaq and he would like to try this.   F/u in afib clinic, 05/03/21. He reports that he is doing well staying on Multaq and it is keeping him in SR for the most  part. He did have a very short episode of afib several weeks  ago, labs reviewed form PCP and CMET/CBC and within normal limits. He is happy with management of his afib. He continues on asa and eliquis. His stool card for occult blood with PCP was positive with a normal CBC. He is due to have this repeated in the near future.    Today, he denies symptoms of palpitations, chest pain, shortness of breath, orthopnea, PND, lower extremity edema, dizziness, presyncope, syncope, or neurologic sequela. The patient is tolerating medications without difficulties and is otherwise without complaint today.   Past Medical History:  Diagnosis Date   Allergic rhinitis    Arthritis    Carotid artery stenosis    1-39% bilateral dopplers 05/2020   Coronary artery disease 04/13/2008   Drug eluting stent in RCA // Myoview 04/2019: EF 52, normal perfusion; low risk   Hypercholesteremia    Hypertension    RBBB    Renal disorder    Tobacco abuse    Past Surgical History:  Procedure Laterality Date   CAROTID STENT  2010   SHOULDER ACROMIOPLASTY  2010    Current Outpatient Medications  Medication Sig Dispense Refill   amLODipine (NORVASC) 5 MG tablet TAKE 1 TABLET(5 MG) BY MOUTH DAILY 90 tablet 1   aspirin EC 81 MG tablet Take 81  mg by mouth daily. Swallow whole.     atorvastatin (LIPITOR) 20 MG tablet Take 1 tablet (20 mg total) by mouth daily. 90 tablet 1   ELIQUIS 5 MG TABS tablet TAKE 1 TABLET(5 MG) BY MOUTH TWICE DAILY 145 tablet 1   EPINEPHrine (EPIPEN IJ) Inject as directed as needed (bee stings).      fluticasone (FLONASE) 50 MCG/ACT nasal spray Place 2 sprays into both nostrils daily as needed.      HYDROcodone-acetaminophen (NORCO/VICODIN) 5-325 MG tablet Take 1 tablet by mouth every 6 (six) hours as needed for moderate pain. 30 tablet 0   meloxicam (MOBIC) 15 MG tablet Take 1 tablet (15 mg total) by mouth daily. 30 tablet 0   MULTAQ 400 MG tablet TAKE 1 TABLET(400 MG) BY MOUTH TWICE DAILY WITH A MEAL 60 tablet 5   omeprazole (PRILOSEC) 20 MG capsule Take 20  mg by mouth as needed.     Tiotropium Bromide Monohydrate (SPIRIVA RESPIMAT) 2.5 MCG/ACT AERS Inhale 2 puffs into the lungs as needed.     metoprolol tartrate (LOPRESSOR) 25 MG tablet Take 0.5 tablets (12.5 mg total) by mouth 2 (two) times daily.     Nicotine 21-14-7 MG/24HR KIT Place 1 patch onto the skin as directed. (Patient not taking: Reported on 05/03/2021) 1 kit 0   Current Facility-Administered Medications  Medication Dose Route Frequency Provider Last Rate Last Admin   triamcinolone acetonide (KENALOG) 10 MG/ML injection 10 mg  10 mg Other Once Julian Weaver, Julian Weaver        Allergies  Allergen Reactions   Bee Venom Swelling   Amoxicillin-Pot Clavulanate Nausea And Vomiting    Severe nausea and vomiting   Hydromorphone     vomiting   Tramadol Other (See Comments)    Social History   Socioeconomic History   Marital status: Married    Spouse name: Not on file   Number of children: Not on file   Years of education: Not on file   Highest education level: Not on file  Occupational History   Not on file  Tobacco Use   Smoking status: Every Day    Packs/day: 1.00    Years: 56.00    Pack years: 56.00    Types: Cigarettes    Start date: 10/27/1982   Smokeless tobacco: Never   Tobacco comments:    Half-pack daily 11/09/2020  Substance and Sexual Activity   Alcohol use: Yes    Alcohol/week: 1.0 standard drink    Types: 1 Cans of beer per week    Comment: Rarely- 3 times a month   Drug use: No   Sexual activity: Not on file  Other Topics Concern   Not on file  Social History Narrative   Not on file   Social Determinants of Health   Financial Resource Strain: Not on file  Food Insecurity: Not on file  Transportation Needs: Not on file  Physical Activity: Not on file  Stress: Not on file  Social Connections: Not on file  Intimate Partner Violence: Not on file    Family History  Problem Relation Age of Onset   Stroke Mother    Stroke Father     ROS- All systems  are reviewed and negative except as per the HPI above  Physical Exam: Vitals:   05/03/21 0826  BP: 120/64  Pulse: (!) 51  Weight: 78.9 kg  Height: '5\' 9"'  (1.753 m)   Wt Readings from Last 3 Encounters:  05/03/21 78.9 kg  04/03/21  79.1 kg  01/01/21 79.2 kg    Labs: Lab Results  Component Value Date   NA 140 03/20/2020   K 4.3 03/20/2020   CL 103 03/20/2020   CO2 26 03/20/2020   GLUCOSE 88 03/20/2020   BUN 13 03/20/2020   CREATININE 0.81 03/20/2020   CALCIUM 9.2 03/20/2020   No results found for: INR Lab Results  Component Value Date   CHOL 118 03/20/2020   HDL 49 03/20/2020   LDLCALC 49 03/20/2020   TRIG 109 03/20/2020     GEN- The patient is well appearing, alert and oriented x 3 today.   Head- normocephalic, atraumatic Eyes-  Sclera clear, conjunctiva pink Ears- hearing intact Oropharynx- clear Neck- supple, no JVP Lymph- no cervical lymphadenopathy Lungs- Clear to ausculation bilaterally, normal work of breathing Heart- Regular rate and rhythm, no murmurs, rubs or gallops, PMI not laterally displaced GI- soft, NT, ND, + BS Extremities- no clubbing, cyanosis, or edema MS- no significant deformity or atrophy Skin- no rash or lesion Psych- euthymic mood, full affect Neuro- strength and sensation are intact  EKG-Vent. rate 51 BPM PR interval 150 ms QRS duration 138 ms QT/QTcB 456/420 ms P-R-T axes 76 89 71 Sinus bradycardia Right bundle branch block Abnormal ECG  Echo-Left Ventricle: Left ventricular ejection fraction, by visual estimation,  is 55 to 60%. The left ventricle has normal function. The average left  ventricular global longitudinal strain is -20.2 %. The left ventricle has  no regional wall motion  abnormalities. There is no left ventricular hypertrophy. Left ventricular  diastolic parameters are consistent with Grade I diastolic dysfunction  (impaired relaxation). Indeterminate filling pressures.  Left Atrium: Left atrial size was normal  in size  Monitor results- Predominant rhythm is normal sinus rhythm with average heart rate 60bpm and ranged from 46 to 206bpm. Paroxyxmal atrial fibrillation with RVR up to 206bpm with possible aberrancy Wide complex tachycardia up to 5 beats.    Assessment and Plan: 1.Paroxysmal  afib/flutter  Staying in SR  Continue  Multaq 400 mg bid   Continue metoprolol tartrate to 12.5 mg mg bid  If breaks thru Multaq then ablation can be considered   2. Lifestyle issues He has greatly cut back on caffeine Smoking cessation encouraged    3. CHA2DS2VASc score of at least 4 Bleeding precautions discussed  Continue eliquis 5 mg bid   4. CAD No anginal symptoms per Dr. Radford Pax  Continue asa  F/u in afib clinic in 6 months Dr. Radford Pax as scheduled    Julian Weaver, [facility] [address] [place], [place] [phone]

## 2021-06-18 DIAGNOSIS — N5201 Erectile dysfunction due to arterial insufficiency: Secondary | ICD-10-CM | POA: Diagnosis not present

## 2021-06-18 DIAGNOSIS — Z87442 Personal history of urinary calculi: Secondary | ICD-10-CM | POA: Diagnosis not present

## 2021-06-18 DIAGNOSIS — R3121 Asymptomatic microscopic hematuria: Secondary | ICD-10-CM | POA: Diagnosis not present

## 2021-06-25 ENCOUNTER — Other Ambulatory Visit: Payer: Self-pay | Admitting: Internal Medicine

## 2021-06-25 NOTE — Telephone Encounter (Signed)
Pt last saw Rudi Coco, NP on 05/03/21, last labs 02/08/21 Creat 0.93, age 76, weight 78.9kg, based on specified criteria pt is on appropriate dosage of Eliquis 5mg  BID for afib.  Will refill rx.  ?

## 2021-06-25 NOTE — Telephone Encounter (Signed)
Called pharmacy, this is a duplicate request.  Verified they received earlier refill authorization for 180 tablets with 1 refill done today.  Will refuse duplicate request.  ?

## 2021-07-08 ENCOUNTER — Other Ambulatory Visit: Payer: Self-pay | Admitting: Cardiology

## 2021-07-24 DIAGNOSIS — R3121 Asymptomatic microscopic hematuria: Secondary | ICD-10-CM | POA: Diagnosis not present

## 2021-07-24 DIAGNOSIS — Z87442 Personal history of urinary calculi: Secondary | ICD-10-CM | POA: Diagnosis not present

## 2021-09-03 ENCOUNTER — Other Ambulatory Visit (HOSPITAL_COMMUNITY): Payer: Self-pay | Admitting: Nurse Practitioner

## 2021-09-10 ENCOUNTER — Ambulatory Visit
Admission: RE | Admit: 2021-09-10 | Discharge: 2021-09-10 | Disposition: A | Discharge status: Home / Self Care | Payer: Medicare Other | Source: Ambulatory Visit

## 2021-09-10 DIAGNOSIS — F17210 Nicotine dependence, cigarettes, uncomplicated: Secondary | ICD-10-CM | POA: Diagnosis not present

## 2021-09-10 DIAGNOSIS — Z87891 Personal history of nicotine dependence: Secondary | ICD-10-CM

## 2021-09-12 ENCOUNTER — Other Ambulatory Visit: Payer: Self-pay

## 2021-09-12 DIAGNOSIS — Z122 Encounter for screening for malignant neoplasm of respiratory organs: Secondary | ICD-10-CM

## 2021-09-12 DIAGNOSIS — Z85828 Personal history of other malignant neoplasm of skin: Secondary | ICD-10-CM | POA: Diagnosis not present

## 2021-09-12 DIAGNOSIS — L300 Nummular dermatitis: Secondary | ICD-10-CM | POA: Diagnosis not present

## 2021-09-12 DIAGNOSIS — F17210 Nicotine dependence, cigarettes, uncomplicated: Secondary | ICD-10-CM

## 2021-09-12 DIAGNOSIS — L565 Disseminated superficial actinic porokeratosis (DSAP): Secondary | ICD-10-CM | POA: Diagnosis not present

## 2021-09-12 DIAGNOSIS — L570 Actinic keratosis: Secondary | ICD-10-CM | POA: Diagnosis not present

## 2021-09-12 DIAGNOSIS — Z87891 Personal history of nicotine dependence: Secondary | ICD-10-CM

## 2021-09-12 DIAGNOSIS — L814 Other melanin hyperpigmentation: Secondary | ICD-10-CM | POA: Diagnosis not present

## 2021-09-23 ENCOUNTER — Other Ambulatory Visit: Payer: Self-pay | Admitting: Cardiology

## 2021-09-28 DIAGNOSIS — I2510 Atherosclerotic heart disease of native coronary artery without angina pectoris: Secondary | ICD-10-CM | POA: Diagnosis not present

## 2021-09-28 DIAGNOSIS — J449 Chronic obstructive pulmonary disease, unspecified: Secondary | ICD-10-CM | POA: Diagnosis not present

## 2021-09-28 DIAGNOSIS — F17200 Nicotine dependence, unspecified, uncomplicated: Secondary | ICD-10-CM | POA: Diagnosis not present

## 2021-09-28 DIAGNOSIS — Z79899 Other long term (current) drug therapy: Secondary | ICD-10-CM | POA: Diagnosis not present

## 2021-10-15 ENCOUNTER — Telehealth (HOSPITAL_COMMUNITY): Payer: Self-pay | Admitting: Nurse Practitioner

## 2021-10-22 NOTE — Telephone Encounter (Signed)
Opened in error

## 2021-10-30 ENCOUNTER — Ambulatory Visit (HOSPITAL_COMMUNITY)
Admission: RE | Admit: 2021-10-30 | Discharge: 2021-10-30 | Disposition: A | Discharge status: Home / Self Care | Payer: Medicare Other | Source: Ambulatory Visit | Attending: Nurse Practitioner | Admitting: Nurse Practitioner

## 2021-10-30 ENCOUNTER — Encounter (HOSPITAL_COMMUNITY): Payer: Self-pay | Admitting: Nurse Practitioner

## 2021-10-30 VITALS — BP 114/56 | HR 52 | Ht 69.0 in | Wt 177.8 lb

## 2021-10-30 DIAGNOSIS — I2510 Atherosclerotic heart disease of native coronary artery without angina pectoris: Secondary | ICD-10-CM | POA: Diagnosis not present

## 2021-10-30 DIAGNOSIS — I480 Paroxysmal atrial fibrillation: Secondary | ICD-10-CM | POA: Insufficient documentation

## 2021-10-30 DIAGNOSIS — I4892 Unspecified atrial flutter: Secondary | ICD-10-CM | POA: Insufficient documentation

## 2021-10-30 DIAGNOSIS — I10 Essential (primary) hypertension: Secondary | ICD-10-CM | POA: Diagnosis not present

## 2021-10-30 DIAGNOSIS — D6869 Other thrombophilia: Secondary | ICD-10-CM

## 2021-10-30 DIAGNOSIS — Z7901 Long term (current) use of anticoagulants: Secondary | ICD-10-CM | POA: Insufficient documentation

## 2021-10-30 MED ORDER — APIXABAN 5 MG PO TABS: 5.0000 mg | ORAL_TABLET | Freq: Two times a day (BID) | ORAL | 1 refills | Status: DC

## 2021-10-30 NOTE — Progress Notes (Signed)
Primary Care Physician: Glenis Smoker, MD Referring Physician: Dr. Berenice Primas Julian Weaver is a 76 y.o. male with a h/o  CAD, HTN, Carotid disease, that is in the afib clinic for newly diagnosed afib. He has noticed for a while that he would have increased heart rate with presyncopal episodes. A 30 day holter monitor was recently placed. 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 Almost immediatly, he got a call from  the monitor company that told him he was out of rhythm and he was started on  metoprolol and eliquis for a CHA2DS2VASc of 4,  and felt  back to his baseline around 8 pm.  Strip showed afib vrs flutter with variable rate at 160 bpm.   He reports that he drinks one beer a week. He also smokes and drinks  around 8 cups of coffee a day, sometimes including a Starbucks with an extra shot or two of caffeine.  He does snore and he wife has woke him up at night because he was not breathing. No previous sleep study. No further episodes noted since starting BB. He is still wearing the monitor for several more  weeks. He is pending a stress test.   F/u in afib clinic 11/09/20. I have not seen pt since !/2021. He saw Dr. Radford Pax in June and was c/o palpitations again. Repeat monitor slowed paroxysmal afib. He is  on BB/eliquis   He has cut way back on caffeine, stopped alcohol. He has cut back on smoking. He has resumed BB and eliquis but is still having breakthrough afib. He had a stress test last year that was low risk and recent echo showed normal EF, normal left atrial size.   I discussed antiarrythmic's. Flecainide should be avoided 2/2 RBBB and CAD. He does not have issues with HR, EF normal range so discussed with him use of Multaq and he would like to try this.   F/u in afib clinic, 05/03/21. He reports that he is doing well staying on Multaq and it is keeping him in SR for the most  part. He did have a very short episode of afib several weeks  ago, labs reviewed form PCP and CMET/CBC and within normal limits. He is happy with management of his afib. He continues on asa and eliquis. His stool card for occult blood with PCP was positive with a normal CBC. He is due to have this repeated in the near future.    F/u in afib clinic, 10/30/21. He reports that he is doing great. No afib to report. Continues on Multaq 400 mg bid. On eliquis without any   issues. He continues to work in [employer].   Today, he denies symptoms of palpitations, chest pain, shortness of breath, orthopnea, PND, lower extremity edema, dizziness, presyncope, syncope, or neurologic sequela. The patient is tolerating medications without difficulties and is otherwise without complaint today.   Past Medical History:  Diagnosis Date   Allergic rhinitis    Arthritis    Carotid artery stenosis    1-39% bilateral dopplers 05/2020   Coronary artery disease 04/13/2008   Drug eluting stent in RCA // Myoview 04/2019: EF 52, normal perfusion; low risk   Hypercholesteremia    Hypertension    RBBB    Renal disorder    Tobacco abuse    Past Surgical History:  Procedure Laterality Date   CAROTID STENT  2010   SHOULDER ACROMIOPLASTY  2010  Current Outpatient Medications  Medication Sig Dispense Refill   amLODipine (NORVASC) 5 MG tablet TAKE 1 TABLET(5 MG) BY MOUTH DAILY 90 tablet 1   aspirin EC 81 MG tablet Take 81 mg by mouth daily. Swallow whole.     atorvastatin (LIPITOR) 20 MG tablet TAKE 1 TABLET(20 MG) BY MOUTH DAILY 90 tablet 1   Coenzyme Q10 (CO Q-10) 100 MG CAPS Take 1 capsule by mouth every morning.     EPINEPHrine (EPIPEN IJ) Inject as directed as needed (bee stings).      fluticasone (FLONASE) 50 MCG/ACT nasal spray Place 2 sprays into both nostrils daily as needed.      Fluticasone-Umeclidin-Vilant (TRELEGY ELLIPTA IN) Inhale 1 puff into the lungs at bedtime. Not sure the dosage     gabapentin (NEURONTIN) 100 MG capsule Take 100 mg by mouth as needed.      HYDROcodone-acetaminophen (NORCO/VICODIN) 5-325 MG tablet Take 1 tablet by mouth every 6 (six) hours as needed for moderate pain. 30 tablet 0   meloxicam (MOBIC) 15 MG tablet Take 1 tablet (15 mg total) by mouth daily. 30 tablet 0   metoprolol tartrate (LOPRESSOR) 25 MG tablet Take 0.5 tablets (12.5 mg total) by mouth 2 (two) times daily.     MULTAQ 400 MG tablet TAKE 1 TABLET(400 MG) BY MOUTH TWICE DAILY WITH A MEAL 60 tablet 6   omeprazole (PRILOSEC) 20 MG capsule Take 20 mg by mouth as needed.     apixaban (ELIQUIS) 5 MG TABS tablet Take 1 tablet (5 mg total) by mouth 2 (two) times daily. 180 tablet 1   buPROPion (WELLBUTRIN XL) 150 MG 24 hr tablet Take 150 mg by mouth every morning. (Patient not taking: Reported on 10/30/2021)     Nicotine 21-14-7 MG/24HR KIT Place 1 patch onto the skin as directed. (Patient not taking: Reported on 10/30/2021) 1 kit 0   Tiotropium Bromide Monohydrate (SPIRIVA RESPIMAT) 2.5 MCG/ACT AERS Inhale 2 puffs into the lungs as needed.     Current Facility-Administered Medications  Medication Dose Route Frequency Provider Last Rate Last Admin   triamcinolone acetonide (KENALOG) 10 MG/ML injection 10 mg  10 mg Other Once Landis Martins, DPM        Allergies  Allergen Reactions   Bee Venom Swelling   Amoxicillin-Pot Clavulanate Nausea And Vomiting    Severe nausea and vomiting   Hydromorphone     vomiting   Tramadol Nausea Only    Social History   Socioeconomic History   Marital status: Married    Spouse name: Not on file   Number of children: Not on file   Years of education: Not on file   Highest education level: Not on file  Occupational History   Not on file  Tobacco Use   Smoking status: Every Day    Packs/day: 1.00    Years: 56.00    Total pack years: 56.00    Types: Cigarettes    Start date: 10/27/1982   Smokeless tobacco: Never   Tobacco comments:    Half-pack daily 11/09/2020  Substance and Sexual Activity   Alcohol use: Yes     Alcohol/week: 1.0 standard drink of alcohol    Types: 1 Cans of beer per week    Comment: Rarely- 3 times a month   Drug use: No   Sexual activity: Not on file  Other Topics Concern   Not on file  Social History Narrative   Not on file   Social Determinants of Health  Financial Resource Strain: Not on file  Food Insecurity: Not on file  Transportation Needs: Not on file  Physical Activity: Not on file  Stress: Not on file  Social Connections: Not on file  Intimate Partner Violence: Not on file    Family History  Problem Relation Age of Onset   Stroke Mother    Stroke Father     ROS- All systems are reviewed and negative except as per the HPI above  Physical Exam: Vitals:   10/30/21 0913  BP: (!) 114/56  Pulse: (!) 52  Weight: 80.6 kg  Height: '5\' 9"'  (1.753 m)   Wt Readings from Last 3 Encounters:  10/30/21 80.6 kg  05/03/21 78.9 kg  04/03/21 79.1 kg    Labs: Lab Results  Component Value Date   NA 140 03/20/2020   K 4.3 03/20/2020   CL 103 03/20/2020   CO2 26 03/20/2020   GLUCOSE 88 03/20/2020   BUN 13 03/20/2020   CREATININE 0.81 03/20/2020   CALCIUM 9.2 03/20/2020   No results found for: "INR" Lab Results  Component Value Date   CHOL 118 03/20/2020   HDL 49 03/20/2020   LDLCALC 49 03/20/2020   TRIG 109 03/20/2020     GEN- The patient is well appearing, alert and oriented x 3 today.   Head- normocephalic, atraumatic Eyes-  Sclera clear, conjunctiva pink Ears- hearing intact Oropharynx- clear Neck- supple, no JVP Lymph- no cervical lymphadenopathy Lungs- Clear to ausculation bilaterally, normal work of breathing Heart- Regular rate and rhythm, no murmurs, rubs or gallops, PMI not laterally displaced GI- soft, NT, ND, + BS Extremities- no clubbing, cyanosis, or edema MS- no significant deformity or atrophy Skin- no rash or lesion Psych- euthymic mood, full affect Neuro- strength and sensation are intact  EKG- Vent. rate 52 BPM PR  interval 146 ms QRS duration 136 ms QT/QTcB 462/429 ms P-R-T axes 74 80 52 Sinus bradycardia Right bundle branch block Abnormal ECG When compared with ECG of 03-May-2021 08:45, PREVIOUS ECG IS PRESENT  Echo-Left Ventricle: Left ventricular ejection fraction, by visual estimation,  is 55 to 60%. The left ventricle has normal function. The average left  ventricular global longitudinal strain is -20.2 %. The left ventricle has  no regional wall motion  abnormalities. There is no left ventricular hypertrophy. Left ventricular  diastolic parameters are consistent with Grade I diastolic dysfunction  (impaired relaxation). Indeterminate filling pressures.  Left Atrium: Left atrial size was normal in size  Monitor results- Predominant rhythm is normal sinus rhythm with average heart rate 60bpm and ranged from 46 to 206bpm. Paroxyxmal atrial fibrillation with RVR up to 206bpm with possible aberrancy Wide complex tachycardia up to 5 beats.    Assessment and Plan: 1.Paroxysmal  afib/flutter  Staying in SR  Continue  Multaq 400 mg bid   Continue metoprolol tartrate to 12.5 mg mg bid  If breaks thru Multaq, ablation can be considered   2. Lifestyle issues He has greatly cut back on caffeine Smoking cessation encouraged    3. CHA2DS2VASc score of at least 4 Continue eliquis 5 mg bid   4. CAD No anginal symptoms, per Dr. Radford Pax  Continue asa  F/u in afib clinic in 6 months Dr. Radford Pax as scheduled    Geroge Baseman. Aveleen Nevers, [facility] [address] [place], [place] [phone]

## 2021-11-08 ENCOUNTER — Ambulatory Visit (INDEPENDENT_AMBULATORY_CARE_PROVIDER_SITE_OTHER): Payer: Medicare Other

## 2021-11-08 ENCOUNTER — Ambulatory Visit: Payer: Medicare Other | Admitting: Cardiology

## 2021-11-08 ENCOUNTER — Ambulatory Visit (INDEPENDENT_AMBULATORY_CARE_PROVIDER_SITE_OTHER): Payer: Medicare Other | Admitting: Podiatry

## 2021-11-08 ENCOUNTER — Encounter: Payer: Self-pay | Admitting: Podiatry

## 2021-11-08 DIAGNOSIS — M2041 Other hammer toe(s) (acquired), right foot: Secondary | ICD-10-CM | POA: Diagnosis not present

## 2021-11-08 DIAGNOSIS — L309 Dermatitis, unspecified: Secondary | ICD-10-CM

## 2021-11-08 DIAGNOSIS — I6523 Occlusion and stenosis of bilateral carotid arteries: Secondary | ICD-10-CM | POA: Diagnosis not present

## 2021-11-08 DIAGNOSIS — M7751 Other enthesopathy of right foot: Secondary | ICD-10-CM | POA: Diagnosis not present

## 2021-11-08 MED ORDER — TRIAMCINOLONE ACETONIDE 10 MG/ML IJ SUSP
10.0000 mg | Freq: Once | INTRAMUSCULAR | Status: AC
  Administered 2021-11-08: 10 mg

## 2021-11-08 NOTE — Progress Notes (Signed)
Subjective:   Patient ID: [name], male   DOB: 76 y.o.   MRN: [id number]   HPI Patient presents stating that the plantar of the right foot has been very sore for him and he did have foot surgery about 12 years ago.  Also has a small patch on his right ankle that he wanted checked stating its been there for a number of years and has remained stable but he wants it looked at.  Patient does smoke a pack of cigarettes per day   ROS      Objective:  Physical Exam  Neurovascular status was found to be intact with muscle strength adequate.  Patient has inflammation pain of this but first metatarsal head right with fluid buildup around the joint surface and does have a small patch on the right ankle medial side measuring about 2 cm x 2 cm that is slightly reddish no breakdown of skin     Assessment:  Inflammatory capsulitis with keratotic lesions of the first metatarsal head right along with a probable venous congestion condition     Plan:  H&P look good condition and went ahead did a sterile prep and injected the first MPJ plantar capsule 3 mg dexamethasone Kenalog 5 mg Xylocaine and then went ahead debrided lesion courtesy discussed the skin area and as long as it remains stable nonpainful and does not grow in size change color I would recommend just watching it and assume its a venous stasis condition  X-rays indicate there is history of bunion correction second digit amputation with moderate elevation of the hallux creating plantar pressure first metatarsal with ultimate metatarsal MPJ fusion may be necessary

## 2021-11-22 ENCOUNTER — Ambulatory Visit: Payer: Medicare Other | Attending: Cardiology | Admitting: Cardiology

## 2021-11-22 ENCOUNTER — Encounter: Payer: Self-pay | Admitting: Cardiology

## 2021-11-22 VITALS — BP 120/58 | HR 64 | Ht 69.0 in | Wt 178.4 lb

## 2021-11-22 DIAGNOSIS — R200 Anesthesia of skin: Secondary | ICD-10-CM | POA: Diagnosis not present

## 2021-11-22 DIAGNOSIS — I2510 Atherosclerotic heart disease of native coronary artery without angina pectoris: Secondary | ICD-10-CM | POA: Diagnosis not present

## 2021-11-22 DIAGNOSIS — I480 Paroxysmal atrial fibrillation: Secondary | ICD-10-CM | POA: Diagnosis not present

## 2021-11-22 DIAGNOSIS — I10 Essential (primary) hypertension: Secondary | ICD-10-CM | POA: Insufficient documentation

## 2021-11-22 DIAGNOSIS — I700 Atherosclerosis of aorta: Secondary | ICD-10-CM | POA: Diagnosis not present

## 2021-11-22 DIAGNOSIS — I6523 Occlusion and stenosis of bilateral carotid arteries: Secondary | ICD-10-CM | POA: Diagnosis not present

## 2021-11-22 DIAGNOSIS — E785 Hyperlipidemia, unspecified: Secondary | ICD-10-CM | POA: Insufficient documentation

## 2021-11-22 MED ORDER — APIXABAN 5 MG PO TABS: 5.0000 mg | ORAL_TABLET | Freq: Two times a day (BID) | ORAL | 3 refills | Status: DC

## 2021-11-22 MED ORDER — NITROGLYCERIN 0.4 MG SL SUBL
0.4000 mg | SUBLINGUAL_TABLET | SUBLINGUAL | 3 refills | Status: DC | PRN
Start: 2021-11-22 — End: 2023-03-28

## 2021-11-22 MED ORDER — AMLODIPINE BESYLATE 5 MG PO TABS: 5.0000 mg | ORAL_TABLET | Freq: Every day | ORAL | 3 refills | Status: DC

## 2021-11-22 MED ORDER — METOPROLOL TARTRATE 25 MG PO TABS: 12.5000 mg | ORAL_TABLET | Freq: Two times a day (BID) | ORAL | 3 refills | Status: DC

## 2021-11-22 MED ORDER — ATORVASTATIN CALCIUM 20 MG PO TABS: ORAL_TABLET | ORAL | 3 refills | Status: DC

## 2021-11-22 NOTE — Addendum Note (Signed)
Addended by: Theresia Majors on: 11/22/2021 03:02 PM   Modules accepted: Orders

## 2021-11-22 NOTE — Progress Notes (Signed)
Cardiology Office Note:    Date:  11/22/2021   ID:  Maeola Harman, DOB Nov 27, 1945, MRN [id number]  PCP:  Glenis Smoker, MD  Cardiologist:  Fransico Him, MD    Referring MD: Glenis Smoker, *   Chief Complaint  Patient presents with   Coronary Artery Disease   Hypertension   Hyperlipidemia   Atrial Fibrillation     History of Present Illness:    Julian Weaver is a 76 y.o. male with a hx of CAD status post PCI/DES RCA in 2010, hypertension, hyperlipidemia, tobacco abuse, carotid bruits 1-39% dopplers 04/2017. He had a 2D echo which showed normal LVF with G1DD.  He also had a nuclear stress test that showed no ischemia.  He also has PAF and has been seen in afib clinic and placed on apixaban and Lopressor.   He is here today for followup and is doing well.  He denies any chest pain or pressure, SOB, DOE, PND, orthopnea, LE edema, dizziness, palpitations or syncope. He is compliant with his meds and is tolerating meds with no SE.    Past Medical History:  Diagnosis Date   Allergic rhinitis    Arthritis    Carotid artery stenosis    1-39% bilateral dopplers 05/2020   Coronary artery disease 04/13/2008   Drug eluting stent in RCA // Myoview 04/2019: EF 52, normal perfusion; low risk   Hypercholesteremia    Hypertension    RBBB    Renal disorder    Tobacco abuse     Past Surgical History:  Procedure Laterality Date   CAROTID STENT  2010   SHOULDER ACROMIOPLASTY  2010    Current Medications: Current Meds  Medication Sig   aspirin EC 81 MG tablet Take 81 mg by mouth daily. Swallow whole.   buPROPion (WELLBUTRIN XL) 150 MG 24 hr tablet Take 150 mg by mouth every morning.   EPINEPHrine (EPIPEN IJ) Inject as directed as needed (bee stings).    Fluticasone-Umeclidin-Vilant (TRELEGY ELLIPTA IN) Inhale 1 puff into the lungs at bedtime. Not sure the dosage   gabapentin (NEURONTIN) 100 MG capsule Take 100 mg by mouth as needed.   meloxicam (MOBIC)  15 MG tablet Take 1 tablet (15 mg total) by mouth daily.   MULTAQ 400 MG tablet TAKE 1 TABLET(400 MG) BY MOUTH TWICE DAILY WITH A MEAL   Nicotine 21-14-7 MG/24HR KIT Place 1 patch onto the skin as directed.   omeprazole (PRILOSEC) 20 MG capsule Take 20 mg by mouth as needed.   [DISCONTINUED] amLODipine (NORVASC) 5 MG tablet TAKE 1 TABLET(5 MG) BY MOUTH DAILY   [DISCONTINUED] apixaban (ELIQUIS) 5 MG TABS tablet Take 1 tablet (5 mg total) by mouth 2 (two) times daily.   [DISCONTINUED] atorvastatin (LIPITOR) 20 MG tablet TAKE 1 TABLET(20 MG) BY MOUTH DAILY   [DISCONTINUED] metoprolol tartrate (LOPRESSOR) 25 MG tablet Take 0.5 tablets (12.5 mg total) by mouth 2 (two) times daily.   [DISCONTINUED] nitroGLYCERIN (NITROSTAT) 0.4 MG SL tablet Place 0.4 mg under the tongue every 5 (five) minutes as needed for chest pain.   Current Facility-Administered Medications for the 11/22/21 encounter (Office Visit) with Sueanne Margarita, MD  Medication   triamcinolone acetonide (KENALOG) 10 MG/ML injection 10 mg     Allergies:   Bee venom, Amoxicillin-pot clavulanate, Hydromorphone, and Tramadol   Social History   Socioeconomic History   Marital status: Married    Spouse name: Not on file   Number of children: Not on file  Years of education: Not on file   Highest education level: Not on file  Occupational History   Not on file  Tobacco Use   Smoking status: Every Day    Packs/day: 1.00    Years: 56.00    Total pack years: 56.00    Types: Cigarettes    Start date: 10/27/1982   Smokeless tobacco: Never   Tobacco comments:    Half-pack daily 11/09/2020  Substance and Sexual Activity   Alcohol use: Yes    Alcohol/week: 1.0 standard drink of alcohol    Types: 1 Cans of beer per week    Comment: Rarely- 3 times a month   Drug use: No   Sexual activity: Not on file  Other Topics Concern   Not on file  Social History Narrative   Not on file   Social Determinants of Health   Financial Resource  Strain: Not on file  Food Insecurity: Not on file  Transportation Needs: Not on file  Physical Activity: Not on file  Stress: Not on file  Social Connections: Not on file     Family History: The patient's family history includes Stroke in his father and mother.  ROS:   Please see the history of present illness.    ROS  All other systems reviewed and negative.   EKGs/Labs/Other Studies Reviewed:    The following studies were reviewed today: EKG  EKG:  EKG is not done today   Recent Labs: No results found for requested labs within last 365 days.   Recent Lipid Panel    Component Value Date/Time   CHOL 118 03/20/2020 0831   TRIG 109 03/20/2020 0831   HDL 49 03/20/2020 0831   CHOLHDL 2.4 03/20/2020 0831   CHOLHDL 2.5 05/24/2015 0816   VLDL 16 05/24/2015 0816   LDLCALC 49 03/20/2020 0831    Physical Exam:    VS:  BP (!) 120/58   Pulse 64   Ht '5\' 9"'  (1.753 m)   Wt 178 lb 6.4 oz (80.9 kg)   SpO2 96%   BMI 26.35 kg/m     No data found.    Wt Readings from Last 3 Encounters:  11/22/21 178 lb 6.4 oz (80.9 kg)  10/30/21 177 lb 12.8 oz (80.6 kg)  05/03/21 174 lb (78.9 kg)    GEN: Well nourished, well developed in no acute distress HEENT: Normal NECK: No JVD; No carotid bruits LYMPHATICS: No lymphadenopathy CARDIAC:RRR, no murmurs, rubs, gallops RESPIRATORY:  Clear to auscultation without rales, wheezing or rhonchi  ABDOMEN: Soft, non-tender, non-distended MUSCULOSKELETAL:  No edema; No deformity  SKIN: Warm and dry NEUROLOGIC:  Alert and oriented x 3 PSYCHIATRIC:  Normal affect  ASSESSMENT:    1. Paroxysmal atrial fibrillation (HCC)   3. Bilateral carotid artery stenosis   4. Essential hypertension, benign   5. Pure hypercholesterolemia    PLAN:    In order of problems listed above:  1.  PAF -Continues to maintain normal sinus rhythm on exam denies any palpitations -he denies any bleeding problems on DOAC -Continue drug management with Eliquis 5  mg twice daily and Lopressor 12.5 mg twice daily as well as Multaq 400 mg twice daily with as needed refills -I have personally reviewed and interpreted outside labs performed by patient's PCP which showed serum creatinine 0.99, potassium 4.5 and ALT 19 on 09/28/2021.  Hemoglobin was also 13.4.  2.  Bilateral carotid artery stenosis -Repeat dopplers 06/2020 showed stable 1-39% bilateral stenosis -continue statin -Repeat Dopplers 06/2022  3.  HTN -BP controlled on exam today -Continue prescription drug management with Lopressor 12.5 mg twice daily and amlodipine 5 mg daily with as needed refills  4.  HLD -LDL goal < 70 -I have personally reviewed and interpreted outside labs performed by patient's PCP which showed LDL 28 and HDL 53 on 02/08/2021 -Continue prescription drug management with atorvastatin 20 mg daily with as needed refills  5.  ASCAD -status post PCI/DES RCA in 2010 -He has not had any anginal symptoms -Continue for ASA 81 mg daily and atorvastatin  7.  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  Concerns regarding medicines are outlined above.  No orders of the defined types were placed in this encounter.  Meds ordered this encounter  Medications   amLODipine (NORVASC) 5 MG tablet    Sig: Take 1 tablet (5 mg total) by mouth daily.    Dispense:  90 tablet    Refill:  3   apixaban (ELIQUIS) 5 MG TABS tablet    Sig: Take 1 tablet (5 mg total) by mouth 2 (two) times daily.    Dispense:  180 tablet    Refill:  3   atorvastatin (LIPITOR) 20 MG tablet    Sig: TAKE 1 TABLET(20 MG) BY MOUTH DAILY    Dispense:  90 tablet    Refill:  3   metoprolol tartrate (LOPRESSOR) 25 MG tablet     Sig: Take 0.5 tablets (12.5 mg total) by mouth 2 (two) times daily.    Dispense:  180 tablet    Refill:  3   nitroGLYCERIN (NITROSTAT) 0.4 MG SL tablet    Sig: Place 1 tablet (0.4 mg total) under the tongue every 5 (five) minutes as needed for chest pain.    Dispense:  25 tablet    Refill:  3    Signed, Fransico Him, MD  11/22/2021 2:53 PM    [place]

## 2021-11-22 NOTE — Patient Instructions (Signed)
Medication Instructions:  Your physician recommends that you continue on your current medications as directed. Please refer to the Current Medication list given to you today.  *If you need a refill on your cardiac medications before your next appointment, please call your pharmacy*   Lab Work: Fasting Lipids and ALT in November  If you have labs (blood work) drawn today and your tests are completely normal, you will receive your results only by: MyChart Message (if you have MyChart) OR A paper copy in the mail If you have any lab test that is abnormal or we need to change your treatment, we will call you to review the results.  Follow-Up: At [facility], you and your health needs are our priority.  As part of our continuing mission to provide you with exceptional heart care, we have created designated Provider Care Teams.  These Care Teams include your primary Cardiologist (physician) and Advanced Practice Providers (APPs -  Physician Assistants and Nurse Practitioners) who all work together to provide you with the care you need, when you need it.  Your next appointment:   1 year(s)  The format for your next appointment:   In Person  Provider:   Armanda Magic, MD     Other Instructions   Important Information About Sugar

## 2021-12-31 DIAGNOSIS — Z23 Encounter for immunization: Secondary | ICD-10-CM | POA: Diagnosis not present

## 2022-01-22 ENCOUNTER — Other Ambulatory Visit: Payer: Self-pay | Admitting: *Deleted

## 2022-01-23 ENCOUNTER — Other Ambulatory Visit: Payer: Self-pay | Admitting: *Deleted

## 2022-01-29 DIAGNOSIS — Z91030 Bee allergy status: Secondary | ICD-10-CM | POA: Diagnosis not present

## 2022-01-29 DIAGNOSIS — I10 Essential (primary) hypertension: Secondary | ICD-10-CM | POA: Diagnosis not present

## 2022-01-29 DIAGNOSIS — Z0000 Encounter for general adult medical examination without abnormal findings: Secondary | ICD-10-CM | POA: Diagnosis not present

## 2022-01-29 DIAGNOSIS — E7800 Pure hypercholesterolemia, unspecified: Secondary | ICD-10-CM | POA: Diagnosis not present

## 2022-01-29 DIAGNOSIS — Z79899 Other long term (current) drug therapy: Secondary | ICD-10-CM | POA: Diagnosis not present

## 2022-01-29 DIAGNOSIS — J449 Chronic obstructive pulmonary disease, unspecified: Secondary | ICD-10-CM | POA: Diagnosis not present

## 2022-01-29 DIAGNOSIS — M1991 Primary osteoarthritis, unspecified site: Secondary | ICD-10-CM | POA: Diagnosis not present

## 2022-01-29 DIAGNOSIS — I700 Atherosclerosis of aorta: Secondary | ICD-10-CM | POA: Diagnosis not present

## 2022-01-29 DIAGNOSIS — R195 Other fecal abnormalities: Secondary | ICD-10-CM | POA: Diagnosis not present

## 2022-01-29 DIAGNOSIS — N521 Erectile dysfunction due to diseases classified elsewhere: Secondary | ICD-10-CM | POA: Diagnosis not present

## 2022-01-29 DIAGNOSIS — R7301 Impaired fasting glucose: Secondary | ICD-10-CM | POA: Diagnosis not present

## 2022-01-29 DIAGNOSIS — F17200 Nicotine dependence, unspecified, uncomplicated: Secondary | ICD-10-CM | POA: Diagnosis not present

## 2022-02-06 ENCOUNTER — Ambulatory Visit: Payer: Medicare Other | Attending: Cardiology

## 2022-02-06 DIAGNOSIS — I480 Paroxysmal atrial fibrillation: Secondary | ICD-10-CM

## 2022-02-06 DIAGNOSIS — I6523 Occlusion and stenosis of bilateral carotid arteries: Secondary | ICD-10-CM

## 2022-02-06 DIAGNOSIS — I2510 Atherosclerotic heart disease of native coronary artery without angina pectoris: Secondary | ICD-10-CM

## 2022-02-06 DIAGNOSIS — I700 Atherosclerosis of aorta: Secondary | ICD-10-CM | POA: Diagnosis not present

## 2022-02-06 DIAGNOSIS — I10 Essential (primary) hypertension: Secondary | ICD-10-CM

## 2022-02-06 DIAGNOSIS — R200 Anesthesia of skin: Secondary | ICD-10-CM

## 2022-02-06 DIAGNOSIS — E785 Hyperlipidemia, unspecified: Secondary | ICD-10-CM

## 2022-02-06 LAB — LIPID PANEL
Chol/HDL Ratio: 2.3 ratio (ref 0.0–5.0)
Cholesterol, Total: 91 mg/dL — ABNORMAL LOW (ref 100–199)
HDL: 39 mg/dL — ABNORMAL LOW (ref >39)
LDL Chol Calc (NIH): 37 mg/dL (ref 0–99)
Triglycerides: 72 mg/dL (ref 0–149)
VLDL Cholesterol Cal: 15 mg/dL (ref 5–40)

## 2022-02-06 LAB — ALT: ALT: 21 IU/L (ref 0–44)

## 2022-02-11 ENCOUNTER — Ambulatory Visit: Payer: Medicare Other | Admitting: Cardiology

## 2022-02-13 ENCOUNTER — Ambulatory Visit (INDEPENDENT_AMBULATORY_CARE_PROVIDER_SITE_OTHER): Payer: Medicare Other | Admitting: Orthopaedic Surgery

## 2022-02-13 ENCOUNTER — Other Ambulatory Visit (HOSPITAL_BASED_OUTPATIENT_CLINIC_OR_DEPARTMENT_OTHER): Payer: Self-pay | Admitting: Orthopaedic Surgery

## 2022-02-13 ENCOUNTER — Ambulatory Visit (INDEPENDENT_AMBULATORY_CARE_PROVIDER_SITE_OTHER): Payer: Medicare Other

## 2022-02-13 DIAGNOSIS — M25522 Pain in left elbow: Secondary | ICD-10-CM

## 2022-02-13 DIAGNOSIS — M7022 Olecranon bursitis, left elbow: Secondary | ICD-10-CM

## 2022-02-13 NOTE — Progress Notes (Signed)
Chief Complaint: Left elbow bursitis     History of Present Illness:    Julian Weaver is a 76 y.o. male presents today with left elbow swelling which has been ongoing now for approximately 1 day.  He states that this initially significantly ballooned but has subsequently decreased.  This has been getting a shooting pain up and down the arm.  He is here today for further assessment.  Denies any redness fevers or chills    Surgical History:   None  PMH/PSH/Family History/Social History/Meds/Allergies:    Past Medical History:  Diagnosis Date   Allergic rhinitis    Aortic atherosclerosis (HCC)    Arthritis    Carotid artery stenosis    1-39% bilateral dopplers 05/2020   Coronary artery disease 04/13/2008   Drug eluting stent in RCA // Myoview 04/2019: EF 52, normal perfusion; low risk   Hypercholesteremia    Hypertension    RBBB    Renal disorder    Tobacco abuse    Past Surgical History:  Procedure Laterality Date   CAROTID STENT  2010   SHOULDER ACROMIOPLASTY  2010   Social History   Socioeconomic History   Marital status: Married    Spouse name: Not on file   Number of children: Not on file   Years of education: Not on file   Highest education level: Not on file  Occupational History   Not on file  Tobacco Use   Smoking status: Every Day    Packs/day: 1.00    Years: 56.00    Total pack years: 56.00    Types: Cigarettes    Start date: 10/27/1982   Smokeless tobacco: Never   Tobacco comments:    Half-pack daily 11/09/2020  Substance and Sexual Activity   Alcohol use: Yes    Alcohol/week: 1.0 standard drink of alcohol    Types: 1 Cans of beer per week    Comment: Rarely- 3 times a month   Drug use: No   Sexual activity: Not on file  Other Topics Concern   Not on file  Social History Narrative   Not on file   Social Determinants of Health   Financial Resource Strain: Not on file  Food Insecurity: Not on file   Transportation Needs: Not on file  Physical Activity: Not on file  Stress: Not on file  Social Connections: Not on file   Family History  Problem Relation Age of Onset   Stroke Mother    Stroke Father    Allergies  Allergen Reactions   Bee Venom Swelling   Amoxicillin-Pot Clavulanate Nausea And Vomiting    Severe nausea and vomiting   Hydromorphone     vomiting   Tramadol Nausea Only   Current Outpatient Medications  Medication Sig Dispense Refill   amLODipine (NORVASC) 5 MG tablet Take 1 tablet (5 mg total) by mouth daily. 90 tablet 3   apixaban (ELIQUIS) 5 MG TABS tablet Take 1 tablet (5 mg total) by mouth 2 (two) times daily. 180 tablet 3   aspirin EC 81 MG tablet Take 81 mg by mouth daily. Swallow whole.     atorvastatin (LIPITOR) 20 MG tablet TAKE 1 TABLET(20 MG) BY MOUTH DAILY 90 tablet 3   buPROPion (WELLBUTRIN XL) 150 MG 24 hr tablet Take 150 mg by mouth every  morning.     EPINEPHrine (EPIPEN IJ) Inject as directed as needed (bee stings).      Fluticasone-Umeclidin-Vilant (TRELEGY ELLIPTA IN) Inhale 1 puff into the lungs at bedtime. Not sure the dosage     gabapentin (NEURONTIN) 100 MG capsule Take 100 mg by mouth as needed.     meloxicam (MOBIC) 15 MG tablet Take 1 tablet (15 mg total) by mouth daily. 30 tablet 0   metoprolol tartrate (LOPRESSOR) 25 MG tablet Take 0.5 tablets (12.5 mg total) by mouth 2 (two) times daily. 180 tablet 3   MULTAQ 400 MG tablet TAKE 1 TABLET(400 MG) BY MOUTH TWICE DAILY WITH A MEAL 60 tablet 6   Nicotine 21-14-7 MG/24HR KIT Place 1 patch onto the skin as directed. 1 kit 0   nitroGLYCERIN (NITROSTAT) 0.4 MG SL tablet Place 1 tablet (0.4 mg total) under the tongue every 5 (five) minutes as needed for chest pain. 25 tablet 3   omeprazole (PRILOSEC) 20 MG capsule Take 20 mg by mouth as needed.     Current Facility-Administered Medications  Medication Dose Route Frequency Provider Last Rate Last Admin   triamcinolone acetonide (KENALOG) 10  MG/ML injection 10 mg  10 mg Other Once Landis Martins, DPM       No results found.  Review of Systems:   A ROS was performed including pertinent positives and negatives as documented in the HPI.  Physical Exam :   Constitutional: NAD and appears stated age Neurological: Alert and oriented Psych: Appropriate affect and cooperative There were no vitals taken for this visit.   Comprehensive Musculoskeletal Exam:    There is swelling about the left olecranon bursa without any active drainage.  No redness.  Range of motion about the elbow is full  Imaging:   Xray (3 views left elbow): There is a small bony spur involving the olecranon, otherwise normal   I personally reviewed and interpreted the radiographs.   Assessment:   76 y.o. male with left elbow olecranon bursitis which has been increasing the last day.  At today's visit I provided a compressive Coban wrap that I would like him to reapply this the next several days.  I will plan to see him back as needed should this not resolve with compressive therapy  Plan :    -Return to clinic as needed     I personally saw and evaluated the patient, and participated in the management and treatment plan.  Vanetta Mulders, MD Attending Physician, Orthopedic Surgery  This document was dictated using Dragon voice recognition software. A reasonable attempt at proof reading has been made to minimize errors.

## 2022-03-29 ENCOUNTER — Other Ambulatory Visit (HOSPITAL_COMMUNITY): Payer: Self-pay | Admitting: Nurse Practitioner

## 2022-04-25 DIAGNOSIS — Z87442 Personal history of urinary calculi: Secondary | ICD-10-CM | POA: Diagnosis not present

## 2022-04-25 DIAGNOSIS — N5201 Erectile dysfunction due to arterial insufficiency: Secondary | ICD-10-CM | POA: Diagnosis not present

## 2022-04-25 DIAGNOSIS — N200 Calculus of kidney: Secondary | ICD-10-CM | POA: Diagnosis not present

## 2022-04-30 ENCOUNTER — Ambulatory Visit (HOSPITAL_COMMUNITY): Payer: Medicare Other | Admitting: Nurse Practitioner

## 2022-05-02 ENCOUNTER — Encounter (HOSPITAL_COMMUNITY): Payer: Self-pay | Admitting: *Deleted

## 2022-05-08 ENCOUNTER — Ambulatory Visit (HOSPITAL_COMMUNITY)
Admission: RE | Admit: 2022-05-08 | Discharge: 2022-05-08 | Disposition: A | Discharge status: Home / Self Care | Payer: Medicare Other | Source: Ambulatory Visit | Attending: Nurse Practitioner | Admitting: Nurse Practitioner

## 2022-05-08 ENCOUNTER — Encounter (HOSPITAL_COMMUNITY): Payer: Self-pay | Admitting: Nurse Practitioner

## 2022-05-08 VITALS — BP 136/62 | HR 52 | Ht 69.0 in | Wt 180.6 lb

## 2022-05-08 DIAGNOSIS — I4891 Unspecified atrial fibrillation: Secondary | ICD-10-CM

## 2022-05-08 DIAGNOSIS — Z7901 Long term (current) use of anticoagulants: Secondary | ICD-10-CM | POA: Diagnosis not present

## 2022-05-08 DIAGNOSIS — I480 Paroxysmal atrial fibrillation: Secondary | ICD-10-CM | POA: Insufficient documentation

## 2022-05-08 DIAGNOSIS — I2510 Atherosclerotic heart disease of native coronary artery without angina pectoris: Secondary | ICD-10-CM | POA: Diagnosis not present

## 2022-05-08 DIAGNOSIS — I10 Essential (primary) hypertension: Secondary | ICD-10-CM | POA: Diagnosis not present

## 2022-05-08 DIAGNOSIS — D6869 Other thrombophilia: Secondary | ICD-10-CM | POA: Diagnosis not present

## 2022-05-08 DIAGNOSIS — I4819 Other persistent atrial fibrillation: Secondary | ICD-10-CM | POA: Diagnosis present

## 2022-05-08 NOTE — Progress Notes (Signed)
Primary Care Physician: Glenis Smoker, MD Referring Physician: Dr. Berenice Primas Donterious Batts is a 77 y.o. male with a h/o  CAD, HTN, Carotid disease, that is in the afib clinic for newly diagnosed afib. He has noticed for a while that he would have increased heart rate with presyncopal episodes. A 30 day holter monitor was recently placed. 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 Almost immediatly, he got a call from  the monitor company that told him he was out of rhythm and he was started on  metoprolol and eliquis for a CHA2DS2VASc of 4,  and felt  back to his baseline around 8 pm.  Strip showed afib vrs flutter with variable rate at 160 bpm.   He reports that he drinks one beer a week. He also smokes and drinks  around 8 cups of coffee a day, sometimes including a [employer] with an extra shot or two of caffeine.  He does snore and he wife has woke him up at night because he was not breathing. No previous sleep study. No further episodes noted since starting BB. He is still wearing the monitor for several more  weeks. He is pending a stress test.   F/u in afib clinic 11/09/20. I have not seen pt since !/2021. He saw Dr. Radford Pax in June and was c/o palpitations again. Repeat monitor slowed paroxysmal afib. He is  on BB/eliquis   He has cut way back on caffeine, stopped alcohol. He has cut back on smoking. He has resumed BB and eliquis but is still having breakthrough afib. He had a stress test last year that was low risk and recent echo showed normal EF, normal left atrial size.   I discussed antiarrythmic's. Flecainide should be avoided 2/2 RBBB and CAD. He does not have issues with HR, EF normal range so discussed with him use of Multaq and he would like to try this.   F/u in afib clinic, 05/03/21. He reports that he is doing well staying on Multaq and it is keeping him in SR for the most  part. He did have a very short episode of afib several weeks  ago, labs reviewed form PCP and CMET/CBC and within normal limits. He is happy with management of his afib. He continues on asa and eliquis. His stool card for occult blood with PCP was positive with a normal CBC. He is due to have this repeated in the near future.    F/u in afib clinic, 10/30/21. He reports that he is doing great. No afib to report. Continues on Multaq 400 mg bid. On eliquis without any  issues. He continues to work in [employer].   F/u in afib clinic 05/08/22. He is still doing well on Multaq 400 mg bid. Rare breakthrough afib that is  usually less than one hour. He continues to work in [employer] with good stamina. No issues with eliquis. Appropriately dosed.   Today, he denies symptoms of palpitations, chest pain, shortness of breath, orthopnea, PND, lower extremity edema, dizziness, presyncope, syncope, or neurologic sequela. The patient is tolerating medications without difficulties and is otherwise without complaint today.   Past Medical History:  Diagnosis Date   Allergic rhinitis    Aortic atherosclerosis (HCC)    Arthritis    Carotid artery stenosis    1-39% bilateral dopplers 05/2020   Coronary artery disease 04/13/2008   Drug eluting stent in RCA // Myoview 04/2019: EF  52, normal perfusion; low risk   Hypercholesteremia    Hypertension    RBBB    Renal disorder    Tobacco abuse    Past Surgical History:  Procedure Laterality Date   CAROTID STENT  2010   SHOULDER ACROMIOPLASTY  2010    Current Outpatient Medications  Medication Sig Dispense Refill   albuterol (VENTOLIN HFA) 108 (90 Base) MCG/ACT inhaler SMARTSIG:2 Puff(s) By Mouth Every 4 Hours PRN     amLODipine (NORVASC) 5 MG tablet Take 1 tablet (5 mg total) by mouth daily. 90 tablet 3   apixaban (ELIQUIS) 5 MG TABS tablet Take 1 tablet (5 mg total) by mouth 2 (two) times daily. 180 tablet 3   aspirin EC 81 MG tablet Take 81 mg by mouth daily. Swallow whole.     atorvastatin (LIPITOR) 20 MG tablet TAKE  1 TABLET(20 MG) BY MOUTH DAILY 90 tablet 3   Coenzyme Q10 (CO Q-10) 100 MG CAPS Take 1 capsule by mouth every morning.     EPINEPHrine (EPIPEN IJ) Inject as directed as needed (bee stings).      Fluticasone-Umeclidin-Vilant 100-62.5-25 MCG/ACT AEPB Inhale 1 puff into the lungs at bedtime. Not sure the dosage     metoprolol tartrate (LOPRESSOR) 25 MG tablet Take 0.5 tablets (12.5 mg total) by mouth 2 (two) times daily. 180 tablet 3   MULTAQ 400 MG tablet TAKE 1 TABLET(400 MG) BY MOUTH TWICE DAILY WITH A MEAL 60 tablet 6   nitroGLYCERIN (NITROSTAT) 0.4 MG SL tablet Place 1 tablet (0.4 mg total) under the tongue every 5 (five) minutes as needed for chest pain. 25 tablet 3   omeprazole (PRILOSEC) 20 MG capsule Take 20 mg by mouth as needed.     buPROPion (WELLBUTRIN XL) 150 MG 24 hr tablet Take 150 mg by mouth every morning. (Patient not taking: Reported on 05/08/2022)     Current Facility-Administered Medications  Medication Dose Route Frequency Provider Last Rate Last Admin   triamcinolone acetonide (KENALOG) 10 MG/ML injection 10 mg  10 mg Other Once Landis Martins, DPM        Allergies  Allergen Reactions   Bee Venom Swelling   Amoxicillin-Pot Clavulanate Nausea And Vomiting    Severe nausea and vomiting   Hydromorphone     vomiting   Tramadol Nausea Only    Social History   Socioeconomic History   Marital status: Married    Spouse name: Not on file   Number of children: Not on file   Years of education: Not on file   Highest education level: Not on file  Occupational History   Not on file  Tobacco Use   Smoking status: Every Day    Packs/day: 1.00    Years: 56.00    Total pack years: 56.00    Types: Cigarettes    Start date: 10/27/1982   Smokeless tobacco: Never   Tobacco comments:    Half-pack daily 11/09/2020  Substance and Sexual Activity   Alcohol use: Yes    Alcohol/week: 1.0 standard drink of alcohol    Types: 1 Cans of beer per week    Comment: Rarely- 3 times a  month   Drug use: No   Sexual activity: Not on file  Other Topics Concern   Not on file  Social History Narrative   Not on file   Social Determinants of Health   Financial Resource Strain: Not on file  Food Insecurity: Not on file  Transportation Needs: Not on file  Physical Activity: Not on file  Stress: Not on file  Social Connections: Not on file  Intimate Partner Violence: Not on file    Family History  Problem Relation Age of Onset   Stroke Mother    Stroke Father     ROS- All systems are reviewed and negative except as per the HPI above  Physical Exam: Vitals:   05/08/22 0855  BP: 136/62  Pulse: (!) 52  Weight: 81.9 kg  Height: 5' 9"$  (1.753 m)   Wt Readings from Last 3 Encounters:  05/08/22 81.9 kg  11/22/21 80.9 kg  10/30/21 80.6 kg    Labs: Lab Results  Component Value Date   NA 140 03/20/2020   K 4.3 03/20/2020   CL 103 03/20/2020   CO2 26 03/20/2020   GLUCOSE 88 03/20/2020   BUN 13 03/20/2020   CREATININE 0.81 03/20/2020   CALCIUM 9.2 03/20/2020   No results found for: "INR" Lab Results  Component Value Date   CHOL 91 (L) 02/06/2022   HDL 39 (L) 02/06/2022   LDLCALC 37 02/06/2022   TRIG 72 02/06/2022     GEN- The patient is well appearing, alert and oriented x 3 today.   Head- normocephalic, atraumatic Eyes-  Sclera clear, conjunctiva pink Ears- hearing intact Oropharynx- clear Neck- supple, no JVP Lymph- no cervical lymphadenopathy Lungs- Clear to ausculation bilaterally, normal work of breathing Heart- Regular rate and rhythm, no murmurs, rubs or gallops, PMI not laterally displaced GI- soft, NT, ND, + BS Extremities- no clubbing, cyanosis, or edema MS- no significant deformity or atrophy Skin- no rash or lesion Psych- euthymic mood, full affect Neuro- strength and sensation are intact  EKG-  Vent. rate 52 BPM PR interval 138 ms QRS duration 138 ms QT/QTcB 468/435 ms P-R-T axes 58 89 66 Sinus bradycardia Right bundle  branch block Abnormal ECG When compared with ECG of 30-Oct-2021 09:38, PREVIOUS ECG IS PRESENT  Echo-Left Ventricle: Left ventricular ejection fraction, by visual estimation,  is 55 to 60%. The left ventricle has normal function. The average left  ventricular global longitudinal strain is -20.2 %. The left ventricle has  no regional wall motion  abnormalities. There is no left ventricular hypertrophy. Left ventricular  diastolic parameters are consistent with Grade I diastolic dysfunction  (impaired relaxation). Indeterminate filling pressures.  Left Atrium: Left atrial size was normal in size  Monitor results- Predominant rhythm is normal sinus rhythm with average heart rate 60bpm and ranged from 46 to 206bpm. Paroxyxmal atrial fibrillation with RVR up to 206bpm with possible aberrancy Wide complex tachycardia up to 5 beats.    Assessment and Plan: 1.Paroxysmal afib/flutter  Staying in SR  Continue  Multaq 400 mg bid   Continue metoprolol tartrate  12.5 mg mg bid  Ablation discussed today and he still defers   2. CHA2DS2VASc score of at least 4 Continue eliquis 5 mg bid   4. CAD No anginal symptoms, per Dr. Radford Pax   F/u in afib clinic in 6 months Dr. Radford Pax as scheduled    Geroge Baseman. Kyiesha Millward, [facility] [address] [place], [place] [address] [phone]

## 2022-05-11 ENCOUNTER — Other Ambulatory Visit: Payer: Self-pay

## 2022-05-11 ENCOUNTER — Ambulatory Visit (HOSPITAL_COMMUNITY)
Admission: EM | Admit: 2022-05-11 | Discharge: 2022-05-11 | Disposition: A | Discharge status: Home / Self Care | Payer: Medicare Other | Attending: Physician Assistant | Admitting: Physician Assistant

## 2022-05-11 ENCOUNTER — Encounter (HOSPITAL_COMMUNITY): Payer: Self-pay | Admitting: *Deleted

## 2022-05-11 DIAGNOSIS — M436 Torticollis: Secondary | ICD-10-CM | POA: Diagnosis not present

## 2022-05-11 DIAGNOSIS — M62838 Other muscle spasm: Secondary | ICD-10-CM | POA: Diagnosis not present

## 2022-05-11 MED ORDER — HYDROCODONE-ACETAMINOPHEN 5-325 MG PO TABS: 1.0000 | ORAL_TABLET | Freq: Four times a day (QID) | ORAL | 0 refills | Status: DC | PRN

## 2022-05-11 MED ORDER — CYCLOBENZAPRINE HCL 10 MG PO TABS: 10.0000 mg | ORAL_TABLET | Freq: Three times a day (TID) | ORAL | 0 refills | Status: DC

## 2022-05-11 NOTE — ED Provider Notes (Signed)
[place]    CSN: [id number] Arrival date & time: 05/11/22  1026      History   Chief Complaint Chief Complaint  Patient presents with   Torticollis    HPI Julian Weaver is a 77 y.o. male.   77 year old male presents with neck pain and stiffness.  Patient indicates for the past 2 days he has been having progressive and persistent bilateral neck pain, upper shoulder pain, with stiffness.  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  Patient indicates he has a limited motion turning to the right turning to the left is unable to look up or look down.  Patient indicates he was not able to sleep last night due to the pain and discomfort.  He indicates he is not getting relief from OTC medications.  He relates his pain is being very bad, on a scale of 1-10 he indicates that the pain is a 10.  He has not have any fever, chills, nausea or vomiting.  He denies having any numbness, tingling, or weakness of the upper extremities.  He indicates he has full range of motion of the upper arms.     Past Medical History:  Diagnosis Date   Allergic rhinitis    Aortic atherosclerosis (HCC)    Arthritis    Carotid artery stenosis    1-39% bilateral dopplers 05/2020   Coronary artery disease 04/13/2008   Drug eluting stent in RCA // Myoview 04/2019: EF 52, normal perfusion; low risk   Hypercholesteremia    Hypertension    RBBB    Renal disorder    Tobacco abuse     Patient Active Problem List   Diagnosis Date Noted   Aortic atherosclerosis ([place]) 11/22/2021   Pain in left knee 05/15/2016   Swelling of left knee joint 05/15/2016   Carotid artery bruit 04/01/2016   Carotid artery stenosis    Cigarette smoker 10/27/2014   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 Authorizing Provider  cyclobenzaprine (FLEXERIL) 10 MG tablet Take 1 tablet (10 mg total) by mouth 3 (three) times daily. 05/11/22  Yes Nyoka Lint, PA-C  HYDROcodone-acetaminophen (NORCO/VICODIN) 5-325 MG tablet Take 1-2 tablets by mouth every 6 (six) hours as needed. 05/11/22  Yes Nyoka Lint, PA-C  albuterol (VENTOLIN HFA) 108 (90 Base) MCG/ACT inhaler SMARTSIG:2 Puff(s) By Mouth Every 4 Hours PRN    [provider]  amLODipine (NORVASC) 5 MG tablet Take 1 tablet (5 mg total) by mouth daily. 11/22/21   Sueanne Margarita, MD  apixaban (ELIQUIS) 5 MG TABS tablet Take 1 tablet (5 mg total) by mouth 2 (two) times daily. 11/22/21   Sueanne Margarita, MD  aspirin EC 81 MG tablet Take 81 mg by mouth daily. Swallow whole.    [provider]  atorvastatin (LIPITOR) 20 MG tablet TAKE 1 TABLET(20 MG) BY MOUTH DAILY 11/22/21   Sueanne Margarita, MD  buPROPion (WELLBUTRIN XL) 150 MG 24 hr tablet Take 150 mg by mouth every morning. Patient not taking: Reported on 05/08/2022 09/28/21   [provider]  Coenzyme Q10 (CO Q-10) 100 MG CAPS Take 1 capsule by mouth every morning.    [provider]  EPINEPHrine (EPIPEN IJ) Inject as directed as needed (bee stings).     [provider]  Fluticasone-Umeclidin-Vilant 100-62.5-25 MCG/ACT AEPB Inhale 1 puff into the lungs at bedtime. Not sure the dosage    [provider]  metoprolol tartrate (LOPRESSOR) 25 MG tablet Take 0.5 tablets (12.5 mg  total) by mouth 2 (two) times daily. 11/22/21   Sueanne Margarita, MD  MULTAQ 400 MG tablet TAKE 1 TABLET(400 MG) BY MOUTH TWICE DAILY WITH A MEAL 03/29/22   Sherran Needs, NP  nitroGLYCERIN (NITROSTAT) 0.4 MG SL tablet Place 1 tablet (0.4 mg total) under the tongue every 5 (five) minutes as needed for chest pain. 11/22/21   Sueanne Margarita, MD  omeprazole (PRILOSEC) 20 MG capsule Take 20 mg by mouth as needed.    [provider]    Family History Family History  Problem Relation Age of Onset   Stroke Mother    Stroke Father     Social History Social History   Tobacco Use   Smoking status: Every Day    Packs/day: 1.00    Years: 56.00    Total pack years: 56.00    Types: Cigarettes    Start date: 10/27/1982   Smokeless tobacco: Never   Tobacco comments:    Half-pack daily 11/09/2020  Substance Use Topics   Alcohol use: Yes    Alcohol/week: 1.0 standard drink of alcohol    Types: 1 Cans of beer per week    Comment: Rarely- 3 times a month   Drug use: No     Allergies   Bee venom, Amoxicillin-pot clavulanate, Hydromorphone, and Tramadol   Review of Systems Review of Systems  Musculoskeletal:  Positive for neck pain and neck stiffness.     Physical Exam Triage Vital Signs ED Triage Vitals  Enc Vitals Group     BP 05/11/22 1206 (!) 159/79     Pulse Rate 05/11/22 1206 63     Resp 05/11/22 1206 18     Temp 05/11/22 1206 98.1 F (36.7 C)     Temp src --      SpO2 05/11/22 1206 98 %     Weight --      Height --      Head Circumference --      Peak Flow --      Pain Score 05/11/22 1203 8     Pain Loc --      Pain Edu? --      Excl. in [place]? --    No data found.  Updated Vital Signs BP (!) 159/79   Pulse 63   Temp 98.1 F (36.7 C)   Resp 18   SpO2 98%   Visual Acuity Right Eye Distance:   Left Eye Distance:   Bilateral Distance:    Right Eye Near:   Left Eye Near:    Bilateral Near:     Physical Exam Constitutional:      Appearance: Normal  appearance.  HENT:     Right Ear: Tympanic membrane and ear canal normal.  Left Ear: Tympanic membrane and ear canal normal.     Mouth/Throat:     Mouth: Mucous membranes are moist.     Pharynx: Oropharynx is clear.  Neck:     Comments: Neck: Range of motion is limited with pain on movement to the right, movement to the left, at 20 degrees.  Limited flexion and extension due to spasm and pain. Cardiovascular:     Rate and Rhythm: Normal rate and regular rhythm.     Heart sounds: Normal heart sounds.  Pulmonary:     Effort: Pulmonary effort is normal.     Breath sounds: Normal breath sounds and air entry. No wheezing, rhonchi or rales.  Musculoskeletal:     Right shoulder: Tenderness (on palpation bilaterally) present.     Left shoulder: Tenderness present.  Lymphadenopathy:     Cervical: No cervical adenopathy.  Neurological:     Mental Status: He is alert.      UC Treatments / Results  Labs (all labs ordered are listed, but only abnormal results are displayed) Labs Reviewed - No data to display  EKG   Radiology No results found.  Procedures Procedures (including critical care time)  Medications Ordered in UC Medications - No data to display  Initial Impression / Assessment and Plan / UC Course  I have reviewed the triage vital signs and the nursing notes.  Pertinent labs & imaging results that were available during my care of the patient were reviewed by me and considered in my medical decision making (see chart for details).    Plan: The diagnosis will be treated with the following: 1.  Acute torticollis: A.  Vicodin tablets 5 mg, 1-2 every 6-8 hours as needed for acute pain. B.  Flexeril 10 mg every 8 hours to relieve muscle spasm and discomfort. 2.  Muscle spasm neck: A.  Flexeril 10 mg every 8 hours to relieve muscle spasm. B.  Advised to use alternating ice and heat to help reduce pain and muscle spasm 4-5 times throughout the day. 3.  Advised  follow-up PCP or return to urgent care as needed. Final Clinical Impressions(s) / UC Diagnoses   Final diagnoses:  Torticollis, acute  Muscle spasms of neck     Discharge Instructions      Advised to take the Vicodin tablets, 1-2 every 6-8 hours as needed for acute pain and muscle spasm. Advised to take the Flexeril 10 mg every 8 hours on a regular basis for muscle spasm and neck strain. Advised to use ice therapy alternating with heat, 10 minutes on 20 minutes off, 4-5 times throughout the day to help decrease discomfort.  Advised follow-up PCP or return to urgent care as needed.   ED Prescriptions     Medication Sig Dispense Auth. Provider   cyclobenzaprine (FLEXERIL) 10 MG tablet Take 1 tablet (10 mg total) by mouth 3 (three) times daily. 21 tablet Nyoka Lint, PA-C   HYDROcodone-acetaminophen (NORCO/VICODIN) 5-325 MG tablet Take 1-2 tablets by mouth every 6 (six) hours as needed. 24 tablet Nyoka Lint, PA-C      I have reviewed the PDMP during this encounter.   Nathanial, Riesen, PA-C 05/11/22 1243

## 2022-05-11 NOTE — ED Triage Notes (Signed)
Pt reports neck pain started 2 days ago. Pt denies any injury to neck.

## 2022-05-11 NOTE — Discharge Instructions (Signed)
Advised to take the Vicodin tablets, 1-2 every 6-8 hours as needed for acute pain and muscle spasm. Advised to take the Flexeril 10 mg every 8 hours on a regular basis for muscle spasm and neck strain. Advised to use ice therapy alternating with heat, 10 minutes on 20 minutes off, 4-5 times throughout the day to help decrease discomfort.  Advised follow-up PCP or return to urgent care as needed.

## 2022-05-14 ENCOUNTER — Ambulatory Visit (INDEPENDENT_AMBULATORY_CARE_PROVIDER_SITE_OTHER): Payer: Medicare Other | Admitting: Orthopaedic Surgery

## 2022-05-14 ENCOUNTER — Encounter: Payer: Self-pay | Admitting: Orthopaedic Surgery

## 2022-05-14 ENCOUNTER — Ambulatory Visit (INDEPENDENT_AMBULATORY_CARE_PROVIDER_SITE_OTHER): Payer: Medicare Other

## 2022-05-14 VITALS — BP 123/68 | HR 75 | Ht 69.0 in | Wt 180.0 lb

## 2022-05-14 DIAGNOSIS — M542 Cervicalgia: Secondary | ICD-10-CM | POA: Diagnosis not present

## 2022-05-14 NOTE — Progress Notes (Signed)
Office Visit Note   Patient: Julian Weaver           Date of Birth: 09-01-1945           MRN: [id number] Visit Date: 05/14/2022              Requested by: Glenis Smoker, MD [place],  [place] [address] PCP: Glenis Smoker, MD   Assessment & Plan: Visit Diagnoses:  1. Neck pain     Plan: Patient has pre-existing cervical spondylosis with acute pain and spasms.  Will set him up for physical therapy.  He was given hydrocodone and Flexeril at the urgent care.  He can use heat/ice, Aspercreme in addition.  Recheck 6 weeks.  If he is having persistent symptoms we will consider MRI imaging studies.  Follow-Up Instructions: Return in about 6 weeks (around 06/25/2022).   Orders:  Orders Placed This Encounter  Procedures   XR Cervical Spine 2 or 3 views   Ambulatory referral to Physical Therapy   No orders of the defined types were placed in this encounter.     Procedures: No procedures performed   Clinical Data: No additional findings.   Subjective: Chief Complaint  Patient presents with   Neck - Pain    HPI 77 year old male with history of cervical spondylosis was loading some stuff on a truck overhead on 05/09/2022 and felt sharp pain in his neck.  He had great difficulty turning his neck denies any numbness or tingling in his hands or fingers.  No balance problems no long track signs.  He has had x-rays obtained in his neck in the past 2021 with episodes of neck pain.  Previous C-spine x-rays 2022.  Review of Systems positive smoking cardiac history coronary artery disease.  All systems noncontributory to HPI.   Objective: Vital Signs: BP 123/68   Pulse 75   Ht 5' 9"$  (1.753 m)   Wt 180 lb (81.6 kg)   BMI 26.58 kg/m   Physical Exam Constitutional:      Appearance: He is well-developed.  HENT:     Head: Normocephalic and atraumatic.     Right Ear: External ear normal.     Left Ear: External ear normal.  Eyes:      Pupils: Pupils are equal, round, and reactive to light.  Neck:     Thyroid: No thyromegaly.     Trachea: No tracheal deviation.  Cardiovascular:     Rate and Rhythm: Normal rate.  Pulmonary:     Effort: Pulmonary effort is normal.     Breath sounds: No wheezing.  Abdominal:     General: Bowel sounds are normal.     Palpations: Abdomen is soft.  Musculoskeletal:     Cervical back: Neck supple.  Skin:    General: Skin is warm and dry.     Capillary Refill: Capillary refill takes less than 2 seconds.  Neurological:     Mental Status: He is alert and oriented to person, place, and time.  Psychiatric:        Behavior: Behavior normal.        Thought Content: Thought content normal.        Judgment: Judgment normal.     Ortho Exam patient has 25% rotation of his neck with sharp pain tenderness of the paraspinal muscles.  Some brachial plexus tenderness right and left.  Upper extremity reflexes are normal good grip strength normal sensation of the hands no lower extremity clonus  and normal heel-toe gait.  Specialty Comments:  No specialty comments available.  Imaging: XR Cervical Spine 2 or 3 views  Result Date: 05/14/2022 AP lateral cervical spine images are obtained and reviewed.  Comparison 02/06/2021.  Again noted is a 2 mm C4-5 anterolisthesis.  Disc space narrowing and spurring anterior and posterior more prominent at C5-6 and C6-7.  No significant progression and no acute change is seen versus previous imaging. Impression mid cervical spondylosis as described above.    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   Left inguinal hernia 11/12/2013   Coronary atherosclerosis of native coronary  artery 03/11/2013   Pure hypercholesterolemia 03/11/2013   Essential hypertension, benign 03/11/2013   Encounter for long-term (current) use of other medications 03/11/2013   Bruit 03/11/2013   Past Medical History:  Diagnosis Date   Allergic rhinitis    Aortic atherosclerosis (HCC)    Arthritis    Carotid artery stenosis    1-39% bilateral dopplers 05/2020   Coronary artery disease 04/13/2008   Drug eluting stent in RCA // Myoview 04/2019: EF 52, normal perfusion; low risk   Hypercholesteremia    Hypertension    RBBB    Renal disorder    Tobacco abuse     Family History  Problem Relation Age of Onset   Stroke Mother    Stroke Father     Past Surgical History:  Procedure Laterality Date   CAROTID STENT  2010   SHOULDER ACROMIOPLASTY  2010   Social History   Occupational History   Not on file  Tobacco Use   Smoking status: Every Day    Packs/day: 1.00    Years: 56.00    Total pack years: 56.00    Types: Cigarettes    Start date: 10/27/1982   Smokeless tobacco: Never   Tobacco comments:    Half-pack daily 11/09/2020  Substance and Sexual Activity   Alcohol use: Yes    Alcohol/week: 1.0 standard drink of alcohol    Types: 1 Cans of beer per week    Comment: Rarely- 3 times a month   Drug use: No   Sexual activity: Not on file

## 2022-05-16 ENCOUNTER — Telehealth: Payer: Self-pay | Admitting: Orthopaedic Surgery

## 2022-05-16 ENCOUNTER — Other Ambulatory Visit: Payer: Self-pay | Admitting: Orthopaedic Surgery

## 2022-05-16 MED ORDER — CYCLOBENZAPRINE HCL 10 MG PO TABS
10.0000 mg | ORAL_TABLET | Freq: Three times a day (TID) | ORAL | 0 refills | Status: DC
Start: 2022-05-16 — End: 2022-08-21

## 2022-05-16 NOTE — Telephone Encounter (Signed)
Called and advised pt.

## 2022-05-16 NOTE — Telephone Encounter (Signed)
Patient called advised Dr. Lorin Mercy was suppose to call him in a muscle relaxer to his pharmacy. Patient said he use [facility] on [address]. The number to contact patient is [phone]

## 2022-05-21 NOTE — Therapy (Signed)
OUTPATIENT PHYSICAL THERAPY EVALUATION   Patient Name: Julian Weaver MRN: [id number] DOB:28-Sep-1945, 77 y.o., male Today's Date: 05/22/2022  END OF SESSION:  PT End of Session - 05/22/22 1054     Visit Number 1    Number of Visits 20    Date for PT Re-Evaluation 07/31/22    Authorization Type Medicare and BCBS    PT Start Time 1058    PT Stop Time 1127    PT Time Calculation (min) 29 min    Activity Tolerance Patient tolerated treatment well    Behavior During Therapy [facility] for tasks assessed/performed             Past Medical History:  Diagnosis Date   Allergic rhinitis    Aortic atherosclerosis ([place])    Arthritis    Carotid artery stenosis    1-39% bilateral dopplers 05/2020   Coronary artery disease 04/13/2008   Drug eluting stent in RCA // Myoview 04/2019: EF 52, normal perfusion; low risk   Hypercholesteremia    Hypertension    RBBB    Renal disorder    Tobacco abuse    Past Surgical History:  Procedure Laterality Date   CAROTID STENT  2010   SHOULDER ACROMIOPLASTY  2010   Patient Active Problem List   Diagnosis Date Noted   Aortic atherosclerosis ([place]) 11/22/2021   Pain in left knee 05/15/2016   Swelling of left knee joint 05/15/2016   Carotid artery bruit 04/01/2016   Carotid artery stenosis    Cigarette smoker 10/27/2014   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   Left inguinal hernia 11/12/2013   Coronary atherosclerosis of native coronary artery 03/11/2013   Pure hypercholesterolemia 03/11/2013   Essential hypertension, benign 03/11/2013   Encounter for long-term (current) use of other medications 03/11/2013   Bruit 03/11/2013    PCP: Glenis Smoker   REFERRING PROVIDER: M54.2 (ICD-10-CM) - Neck pain  REFERRING DIAG: Marybelle Killings, MD  THERAPY DIAG:  Cervicalgia  Abnormal posture  Muscle weakness (generalized)  Rationale for Evaluation and Treatment: Rehabilitation  ONSET DATE:  05/09/2022  SUBJECTIVE:                                                                                                                                                                                                         SUBJECTIVE STATEMENT: Per review of MD visit, Pt was loading stuff into a truck overhead and felt sharp pain in neck.  Reported difficulty c turning head.   He indicated muscles tightened up.   Had some improvement  with muscle relaxers.  Denied arm symptoms.  Did have trouble sleeping but now ok.  Denied dizziness.   PERTINENT HISTORY:  CAD/stenosis, HTN, hypercholesteremia.  He reported previous history of similar complaints.   PAIN:  NPRS scale: at worst in last week 5/10, at best 0/10 Pain location: cervical bilaterally Pain description: tightness/stiffness Aggravating factors: head movements (looking up, head turns)  Relieving factors: muscle relaxer, hot/cold compress  PRECAUTIONS: None  WEIGHT BEARING RESTRICTIONS: No  FALLS:  Has patient fallen in last 6 months? 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 Mobility deficits in passive supine cervical downslope Lt > Rt throughout cervical region.    CERVICAL ROM:   ROM AROM (deg) 05/22/2022  Flexion 56 c posterior cervical pain  Extension 45 c posterior cervical pain  Right lateral flexion   Left lateral flexion   Right rotation 46  Left  rotation 40 c Lt cervical pain   (Blank rows = not tested)  UPPER EXTREMITY MMT:  MMT Right 05/22/2022 Left 05/22/2022  Shoulder flexion 5/5 5/5  Shoulder extension    Shoulder abduction 5/5 5/5  Shoulder adduction    Shoulder extension    Shoulder internal rotation 5/5 5/5  Shoulder external rotation 4+/5 4+/5  Elbow flexion    Elbow extension    Wrist flexion    Wrist extension    Wrist ulnar deviation    Wrist radial deviation    Wrist pronation    Wrist supination     (Blank rows = not tested)   CERVICAL SPECIAL TESTS:  05/22/2022 Cervical distraction:   Spurling's : not indicated  FUNCTIONAL TESTS:  05/22/2022 No specific testing.    TODAY'S TREATMENT:                                                                                       DATE: 05/22/2022 Therex:    HEP instruction/performance c cues for techniques, handout provided.  Trial set performed of each for comprehension and symptom assessment.  See below for exercise list  PATIENT EDUCATION:  Education details: HEP, POC Person educated: Patient Education method: Explanation, Demonstration, Verbal cues, and Handouts Education comprehension: verbalized understanding, returned demonstration, and verbal cues required  HOME EXERCISE PROGRAM: Access Code: [email or web address] URL: [email or web address] Date: 05/22/2022 Prepared by: Scot Jun  Exercises - Shoulder External Rotation and Scapular Retraction  - 3-5 x daily - 7 x weekly - 1 sets - 5-10 reps - 2 hold - Supine Deep Neck Flexor Training  - 2 x daily - 7 x weekly - 1 sets - 10 reps - 5 hold - Supine Cervical Rotation AROM on Pillow  - 1-2 x daily - 7 x  weekly - 1 sets - 10 reps - Seated Assisted Cervical Rotation with Towel  - 2-3 x daily - 7 x weekly - 1 sets - 10 reps - 2-3 hold - Seated Assisted Cervical Rotation with Towel  - 2-3 x daily - 7 x weekly - 1 sets - 10 reps - 2-3 hold  ASSESSMENT:  CLINICAL IMPRESSION: Patient is a 77 y.o.  who comes to clinic with complaints of cervical pain with mobility, strength and movement coordination deficits that impair their ability to perform usual daily and recreational functional activities without increase difficulty/symptoms at this time.  Patient to benefit from skilled PT services to address impairments and limitations to improve to previous level of function without restriction secondary to condition.    OBJECTIVE IMPAIRMENTS: decreased activity tolerance, decreased coordination, decreased endurance, decreased mobility, decreased ROM, decreased strength, hypomobility, increased fascial restrictions, impaired perceived functional ability, increased muscle spasms, impaired flexibility, improper body mechanics, postural dysfunction, and pain.   ACTIVITY LIMITATIONS: carrying, lifting, and reach over head  PARTICIPATION LIMITATIONS: interpersonal relationship, driving, community activity, occupation, and yard work  PERSONAL FACTORS:  CAD/stenosis, HTN, hypercholesteremia   are also affecting patient's functional outcome.   REHAB POTENTIAL: Good  CLINICAL DECISION MAKING: Stable/uncomplicated  EVALUATION COMPLEXITY: Low   GOALS: Goals reviewed with patient? Yes  SHORT TERM GOALS: (target date for Short term goals are 3 weeks 06/12/2022)  1.Patient will demonstrate independent use of home exercise program to maintain progress from in clinic treatments. Goal status: New  LONG TERM GOALS: (target dates for all long term goals are 10 weeks  07/31/2022 )   1. Patient will demonstrate/report pain at worst less than or equal to 2/10 to facilitate minimal limitation in daily activity secondary to pain symptoms. Goal status: New   2. Patient will demonstrate independent use of home exercise program to facilitate ability to maintain/progress functional gains from skilled physical therapy services. Goal status: New   3. Patient will demonstrate FOTO outcome > or = 64 % to indicate  reduced disability due to condition. Goal status: New   4.  Patient will demonstrate cervical AROM improved 20 degrees or better for each direction s symptoms to facilitate usual head movements for daily activity including driving, self care.   Goal status: New   5.  Patient will demonstrate bilateral shoulder ER MMT 5/5 throughout for daily work activity.   Goal status: New   6.  Patient will demonstrate/report ability to perform work and hobby s restriction due to symptoms.  Goal status: New      PLAN:  PT FREQUENCY: 1-2x/week  PT DURATION: 10 weeks  PLANNED INTERVENTIONS: Therapeutic exercises, Therapeutic activity, Neuro Muscular re-education, Balance training, Gait training, Patient/Family education, Joint mobilization, Stair training, DME instructions, Dry Needling, Electrical stimulation, Cryotherapy, vasopneumatic device,Traction, Moist heat, Taping, Ultrasound, Ionotophoresis '4mg'$ /ml Dexamethasone, and Manual therapy.  All included unless contraindicated  PLAN FOR NEXT SESSION: Check HEP use/response.  Cervical mobilizations, thoracic mobility check.    Scot Jun, PT, DPT, OCS, ATC 05/22/22  11:31 AM

## 2022-05-22 ENCOUNTER — Encounter: Payer: Self-pay | Admitting: Rehabilitative and Restorative Service Providers"

## 2022-05-22 ENCOUNTER — Ambulatory Visit (INDEPENDENT_AMBULATORY_CARE_PROVIDER_SITE_OTHER): Payer: Medicare Other | Admitting: Rehabilitative and Restorative Service Providers"

## 2022-05-22 ENCOUNTER — Other Ambulatory Visit: Payer: Self-pay

## 2022-05-22 DIAGNOSIS — R293 Abnormal posture: Secondary | ICD-10-CM

## 2022-05-22 DIAGNOSIS — M542 Cervicalgia: Secondary | ICD-10-CM

## 2022-05-22 DIAGNOSIS — M6281 Muscle weakness (generalized): Secondary | ICD-10-CM | POA: Diagnosis not present

## 2022-05-29 ENCOUNTER — Encounter: Payer: Medicare Other | Admitting: Physical Therapy

## 2022-06-05 ENCOUNTER — Encounter: Payer: Self-pay | Admitting: Physical Therapy

## 2022-06-05 ENCOUNTER — Ambulatory Visit (INDEPENDENT_AMBULATORY_CARE_PROVIDER_SITE_OTHER): Payer: Medicare Other | Admitting: Physical Therapy

## 2022-06-05 DIAGNOSIS — R293 Abnormal posture: Secondary | ICD-10-CM

## 2022-06-05 DIAGNOSIS — M6281 Muscle weakness (generalized): Secondary | ICD-10-CM

## 2022-06-05 DIAGNOSIS — M542 Cervicalgia: Secondary | ICD-10-CM | POA: Diagnosis not present

## 2022-06-05 NOTE — Therapy (Signed)
OUTPATIENT PHYSICAL THERAPY EVALUATION   Patient Name: Julian Weaver MRN: [id number] DOB:1945/11/01, 77 y.o., male Today's Date: 06/05/2022  END OF SESSION:  PT End of Session - 06/05/22 0817     Visit Number 2    Number of Visits 20    Date for PT Re-Evaluation 07/31/22    Authorization Type Medicare and BCBS    Progress Note Due on Visit 10    PT Start Time 0812    PT Stop Time 0850    PT Time Calculation (min) 38 min    Activity Tolerance Patient tolerated treatment well    Behavior During Therapy [facility] for tasks assessed/performed              Past Medical History:  Diagnosis Date   Allergic rhinitis    Aortic atherosclerosis ([place])    Arthritis    Carotid artery stenosis    1-39% bilateral dopplers 05/2020   Coronary artery disease 04/13/2008   Drug eluting stent in RCA // Myoview 04/2019: EF 52, normal perfusion; low risk   Hypercholesteremia    Hypertension    RBBB    Renal disorder    Tobacco abuse    Past Surgical History:  Procedure Laterality Date   CAROTID STENT  2010   SHOULDER ACROMIOPLASTY  2010   Patient Active Problem List   Diagnosis Date Noted   Aortic atherosclerosis ([place]) 11/22/2021   Pain in left knee 05/15/2016   Swelling of left knee joint 05/15/2016   Carotid artery bruit 04/01/2016   Carotid artery stenosis    Cigarette smoker 10/27/2014   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   Left inguinal hernia 11/12/2013   Coronary atherosclerosis of native coronary artery 03/11/2013   Pure hypercholesterolemia 03/11/2013   Essential hypertension, benign 03/11/2013   Encounter for long-term (current) use of other medications 03/11/2013   Bruit 03/11/2013    PCP: Glenis Smoker   REFERRING PROVIDER: M54.2 (ICD-10-CM) - Neck pain  REFERRING DIAG: Marybelle Killings, MD  THERAPY DIAG:  Cervicalgia  Abnormal posture  Muscle weakness (generalized)  Rationale for Evaluation and Treatment:  Rehabilitation  ONSET DATE: 05/09/2022  SUBJECTIVE:                                                                                                                                                                                                         SUBJECTIVE STATEMENT: Pt arriving today from work with no pain at present. Pt stating pain can increase to 5/10 at times.   PERTINENT HISTORY:  CAD/stenosis, HTN, hypercholesteremia.  He reported previous history of similar complaints.   PAIN:  NPRS scale: 5/10 at worst, no pain at present Pain location: cervical bilaterally Pain description: tightness/stiffness Aggravating factors: head movements (looking up, head turns)  Relieving factors: muscle relaxer, hot/cold compress  PRECAUTIONS: None  WEIGHT BEARING RESTRICTIONS: No  FALLS:  Has patient fallen in last 6 months? 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 Mobility deficits in passive supine cervical downslope Lt > Rt throughout cervical region.    CERVICAL ROM:   ROM AROM (deg) 05/22/2022  Flexion 56 c posterior cervical pain  Extension 45 c posterior cervical pain  Right lateral flexion   Left lateral flexion   Right rotation 46  Left rotation 40 c Lt cervical pain   (Blank rows = not tested)  UPPER EXTREMITY MMT:  MMT Right 05/22/2022 Left 05/22/2022  Shoulder flexion 5/5 5/5  Shoulder extension     Shoulder abduction 5/5 5/5  Shoulder adduction    Shoulder extension    Shoulder internal rotation 5/5 5/5  Shoulder external rotation 4+/5 4+/5  Elbow flexion    Elbow extension    Wrist flexion    Wrist extension    Wrist ulnar deviation    Wrist radial deviation    Wrist pronation    Wrist supination     (Blank rows = not tested)   CERVICAL SPECIAL TESTS:  05/22/2022 Cervical distraction:   Spurling's : not indicated  FUNCTIONAL TESTS:  05/22/2022 No specific testing.    TODAY'S TREATMENT:                                                                                        06/05/22:  TherEx:  -UBE: Level 1 x  2 minutes each direction -scapular squeezes x 10 -Rows: Level 3 band 2 x 10 holding 3 sec -cervical rotation in sitting x 5  -levator stretch x 5 holding 10 sec -Corner stretch x 5 holding 20 sec -upper trap stretch x 3 holding 10 sec -supine cervical retraction x 10 holding 5 sec -AAROM shoulder flexion 1 # bar x 10  Manual: -cervical PROM in supine -occipitical release, gentle Grade 2 cervical PA mobs Modalities:  -moist heat x 10 to cervical spine.     DATE: 05/22/2022 Therex:    HEP instruction/performance c cues for techniques, handout provided.  Trial set performed of each for comprehension and symptom assessment.  See below for exercise list  PATIENT EDUCATION:  Education details: HEP, POC Person educated: Patient Education method: Explanation, Demonstration, Verbal cues, and Handouts Education comprehension: verbalized understanding, returned demonstration, and verbal cues required  HOME EXERCISE PROGRAM: Access Code: [email or web address] URL: [email or web address] Date:  05/22/2022 Prepared by: Scot Jun  Exercises - Shoulder External Rotation and Scapular Retraction  - 3-5 x daily - 7 x weekly - 1 sets - 5-10 reps - 2 hold - Supine Deep Neck Flexor Training  - 2 x daily - 7 x weekly - 1 sets - 10 reps - 5 hold - Supine Cervical  Rotation AROM on Pillow  - 1-2 x daily - 7 x weekly - 1 sets - 10 reps - Seated Assisted Cervical Rotation with Towel  - 2-3 x daily - 7 x weekly - 1 sets - 10 reps - 2-3 hold - Seated Assisted Cervical Rotation with Towel  - 2-3 x daily - 7 x weekly - 1 sets - 10 reps - 2-3 hold  ASSESSMENT:  CLINICAL IMPRESSION: Pt arriving today  10 minutes late to session due to traffic. with no pain in his cervical spine. Pt still reporting pain at times of 5/10. Pt's HEP was reviewed. Pt tolerating exercises well. Pt with good response to manual therapy and gentle PROM of cervical spine. Continue with POC to maximize pt's function.    OBJECTIVE IMPAIRMENTS: decreased activity tolerance, decreased coordination, decreased endurance, decreased mobility, decreased ROM, decreased strength, hypomobility, increased fascial restrictions, impaired perceived functional ability, increased muscle spasms, impaired flexibility, improper body mechanics, postural dysfunction, and pain.   ACTIVITY LIMITATIONS: carrying, lifting, and reach over head  PARTICIPATION LIMITATIONS: interpersonal relationship, driving, community activity, occupation, and yard work  PERSONAL FACTORS:  CAD/stenosis, HTN, hypercholesteremia   are also affecting patient's functional outcome.   REHAB POTENTIAL: Good  CLINICAL DECISION MAKING: Stable/uncomplicated  EVALUATION COMPLEXITY: Low   GOALS: Goals reviewed with patient? Yes  SHORT TERM GOALS: (target date for Short term goals are 3 weeks 06/12/2022)  1.Patient will demonstrate independent use of home exercise program to maintain progress from in clinic treatments. Goal status: On-going 06/05/22  LONG TERM GOALS: (target dates for all long term goals are 10 weeks  07/31/2022 )   1. Patient will demonstrate/report pain at worst less than or equal to 2/10 to facilitate minimal limitation in daily activity secondary to pain symptoms. Goal status: New   2. Patient will demonstrate  independent use of home exercise program to facilitate ability to maintain/progress functional gains from skilled physical therapy services. Goal status: New   3. Patient will demonstrate FOTO outcome > or = 64 % to indicate reduced disability due to condition. Goal status: New   4.  Patient will demonstrate cervical AROM improved 20 degrees or better for each direction s symptoms to facilitate usual head movements for daily activity including driving, self care.   Goal status: New   5.  Patient will demonstrate bilateral shoulder ER MMT 5/5 throughout for daily work activity.   Goal status: New   6.  Patient will demonstrate/report ability to perform work and hobby s restriction due to symptoms.  Goal status: New      PLAN:  PT FREQUENCY: 1-2x/week  PT DURATION: 10 weeks  PLANNED INTERVENTIONS: Therapeutic exercises, Therapeutic activity, Neuro Muscular re-education, Balance training, Gait training, Patient/Family education, Joint mobilization, Stair training, DME instructions, Dry Needling, Electrical stimulation, Cryotherapy, vasopneumatic device,Traction, Moist heat, Taping, Ultrasound, Ionotophoresis '4mg'$ /ml Dexamethasone, and Manual therapy.  All included unless contraindicated  PLAN FOR NEXT SESSION:  Cervical mobilizations, thoracic mobility check.    Kearney Hard, PT, MPT 06/05/22 8:21 AM   06/05/22  8:21 AM

## 2022-06-12 ENCOUNTER — Ambulatory Visit (INDEPENDENT_AMBULATORY_CARE_PROVIDER_SITE_OTHER): Payer: Medicare Other | Admitting: Rehabilitative and Restorative Service Providers"

## 2022-06-12 ENCOUNTER — Encounter: Payer: Self-pay | Admitting: Rehabilitative and Restorative Service Providers"

## 2022-06-12 DIAGNOSIS — M542 Cervicalgia: Secondary | ICD-10-CM

## 2022-06-12 DIAGNOSIS — R293 Abnormal posture: Secondary | ICD-10-CM

## 2022-06-12 DIAGNOSIS — M6281 Muscle weakness (generalized): Secondary | ICD-10-CM

## 2022-06-12 DIAGNOSIS — H2513 Age-related nuclear cataract, bilateral: Secondary | ICD-10-CM | POA: Diagnosis not present

## 2022-06-12 NOTE — Therapy (Signed)
OUTPATIENT PHYSICAL THERAPY TREATMENT   Patient Name: Lonny Bartch MRN: [id number] DOB:11-18-1945, 77 y.o., male Today's Date: 06/12/2022  END OF SESSION:  PT End of Session - 06/12/22 0850     Visit Number 3    Number of Visits 20    Date for PT Re-Evaluation 07/31/22    Authorization Type Medicare and BCBS    Progress Note Due on Visit 10    PT Start Time 0848    PT Stop Time 0927    PT Time Calculation (min) 39 min    Activity Tolerance Patient tolerated treatment well    Behavior During Therapy [facility] for tasks assessed/performed               Past Medical History:  Diagnosis Date   Allergic rhinitis    Aortic atherosclerosis ([place])    Arthritis    Carotid artery stenosis    1-39% bilateral dopplers 05/2020   Coronary artery disease 04/13/2008   Drug eluting stent in RCA // Myoview 04/2019: EF 52, normal perfusion; low risk   Hypercholesteremia    Hypertension    RBBB    Renal disorder    Tobacco abuse    Past Surgical History:  Procedure Laterality Date   CAROTID STENT  2010   SHOULDER ACROMIOPLASTY  2010   Patient Active Problem List   Diagnosis Date Noted   Aortic atherosclerosis ([place]) 11/22/2021   Pain in left knee 05/15/2016   Swelling of left knee joint 05/15/2016   Carotid artery bruit 04/01/2016   Carotid artery stenosis    Cigarette smoker 10/27/2014   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   Left inguinal hernia 11/12/2013   Coronary atherosclerosis of native coronary artery 03/11/2013   Pure hypercholesterolemia 03/11/2013   Essential hypertension, benign 03/11/2013   Encounter for long-term (current) use of other medications 03/11/2013   Bruit 03/11/2013    PCP: Glenis Smoker   REFERRING PROVIDER: M54.2 (ICD-10-CM) - Neck pain  REFERRING DIAG: Marybelle Killings, MD  THERAPY DIAG:  Cervicalgia  Abnormal posture  Muscle weakness (generalized)  Rationale for Evaluation and Treatment:  Rehabilitation  ONSET DATE: 05/09/2022  SUBJECTIVE:                                                                                                                                                                                                         SUBJECTIVE STATEMENT: He indicated no pain at rest.  Pain noted c Lt turning mainly today.  Stretching.   PERTINENT HISTORY:  CAD/stenosis, HTN, hypercholesteremia.  He  reported previous history of similar complaints.   PAIN:  NPRS scale: 7/10 with rotation Pain location: cervical bilaterally Pain description: tightness/stiffness Aggravating factors: head movements (looking up, head turns)  Relieving factors: muscle relaxer, hot/cold compress  PRECAUTIONS: None  WEIGHT BEARING RESTRICTIONS: No  FALLS:  Has patient fallen in last 6 months? 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 Mobility deficits in passive supine cervical downslope Lt > Rt throughout cervical region.    CERVICAL ROM:   ROM AROM (deg) 05/22/2022 AROM 06/12/2022  Flexion 56 c posterior cervical pain 50  Extension 45 c posterior cervical pain 50  Right lateral flexion    Left lateral flexion    Right rotation 46 45  Left rotation 40 c Lt cervical pain 45   (Blank rows = not tested)  UPPER EXTREMITY MMT:  MMT Right 05/22/2022 Left 05/22/2022  Shoulder flexion 5/5 5/5  Shoulder extension     Shoulder abduction 5/5 5/5  Shoulder adduction    Shoulder extension    Shoulder internal rotation 5/5 5/5  Shoulder external rotation 4+/5 4+/5  Elbow flexion    Elbow extension    Wrist flexion    Wrist extension    Wrist ulnar deviation    Wrist radial deviation    Wrist pronation    Wrist supination     (Blank rows = not tested)   CERVICAL SPECIAL TESTS:  05/22/2022 Cervical distraction:   Spurling's : not indicated  FUNCTIONAL TESTS:  05/22/2022 No specific testing.    TODAY'S TREATMENT:                                                                    DATE: 06/12/2022:  Manual: Supine cervical downslope mobs throughout cervical region Lt and Rt g3-g4.  Prone cPA G3-G4 mid thoracic   Therex: Seated towel assisted rotation cervical Lt and Rt 5 sec hold x 10 each Corner stretch 15 sec x 5  Seated thoracic extension AROM in chair 3 sec hold x 10 UBE fwd/back 3 mins each way lvl 2.0 Tband Rows green band 2 x 15 Tband GH ext green band 2 x 15 Standing tband ER c towel under arm green band 2 x 10 bilateral  Verbal cues for additions to HEP. Handout provided   TODAY'S TREATMENT:                                                                    DATE: 06/05/22:  TherEx:  -UBE: Level 1 x  2 minutes each direction -  scapular squeezes x 10 -Rows: Level 3 band 2 x 10 holding 3 sec -cervical rotation in sitting x 5  -levator stretch x 5 holding 10 sec -Corner stretch x 5 holding 20 sec -upper trap stretch x 3 holding 10 sec -supine cervical retraction x 10 holding 5 sec -AAROM shoulder flexion 1 # bar x 10  Manual: -cervical PROM in supine -occipitical release, gentle Grade 2 cervical PA mobs Modalities:  -moist heat x 10 to cervical spine.     TODAY'S TREATMENT:                                                                    DATE: 05/22/2022 Therex:    HEP instruction/performance c cues for techniques, handout provided.  Trial set performed of each for  comprehension and symptom assessment.  See below for exercise list  PATIENT EDUCATION:  06/12/2022 Education details: HEP update Person educated: Patient Education method: Explanation, Demonstration, Verbal cues, and Handouts Education comprehension: verbalized understanding, returned demonstration, and verbal cues required  HOME EXERCISE PROGRAM: Access Code: [email or web address] URL: [email or web address] Date: 06/12/2022 Prepared by: Scot Jun  Exercises - Shoulder External Rotation and Scapular Retraction  - 3-5 x daily - 7 x weekly - 1 sets - 5-10 reps - 2 hold - Supine Deep Neck Flexor Training  - 2 x daily - 7 x weekly - 1 sets - 10 reps - 5 hold - Supine Cervical Rotation AROM on Pillow  - 1-2 x daily - 7 x weekly - 1 sets - 10 reps - Seated Assisted Cervical Rotation with Towel  - 2-3 x daily - 7 x weekly - 1 sets - 10 reps - 2-3 hold - Seated Assisted Cervical Rotation with Towel  - 2-3 x daily - 7 x weekly - 1 sets - 10 reps - 2-3 hold - Corner Pec Major Stretch  - 1-2 x daily - 7 x weekly - 1 sets - 3-5 reps - 15 hold - Seated Thoracic Lumbar Extension  - 1-2 x daily - 7 x weekly - 1 sets - 10-15 reps - 3 hold  ASSESSMENT:  CLINICAL IMPRESSION: Cervical mobility deficits continue to be primary impairment.  Mild improvements noted in select motion directions but continued skilled PT services and HEP use to help continue progression to improve functional cervical mobility/symptoms.   OBJECTIVE IMPAIRMENTS: decreased activity tolerance, decreased coordination, decreased endurance, decreased mobility, decreased ROM, decreased strength, hypomobility, increased fascial restrictions, impaired perceived functional ability, increased muscle spasms, impaired flexibility, improper body mechanics, postural dysfunction, and pain.   ACTIVITY LIMITATIONS: carrying, lifting, and reach over head  PARTICIPATION LIMITATIONS: interpersonal relationship, driving, community activity,  occupation, and yard work  PERSONAL FACTORS:  CAD/stenosis, HTN, hypercholesteremia   are also affecting patient's functional outcome.   REHAB POTENTIAL: Good  CLINICAL DECISION MAKING: Stable/uncomplicated  EVALUATION COMPLEXITY: Low   GOALS: Goals reviewed with patient? Yes  SHORT TERM GOALS: (target date for Short term goals are 3 weeks 06/12/2022)  1.Patient will demonstrate independent use of home exercise program to maintain progress from in clinic treatments. Goal status: Met  LONG TERM GOALS: (target dates for all long term goals are 10 weeks  07/31/2022 )   1. Patient will demonstrate/report pain at worst less than or equal  to 2/10 to facilitate minimal limitation in daily activity secondary to pain symptoms. Goal status: on going 06/12/2022   2. Patient will demonstrate independent use of home exercise program to facilitate ability to maintain/progress functional gains from skilled physical therapy services. Goal status: on going 06/12/2022   3. Patient will demonstrate FOTO outcome > or = 64 % to indicate reduced disability due to condition. Goal status: on going 06/12/2022   4.  Patient will demonstrate cervical AROM improved 20 degrees or better for each direction s symptoms to facilitate usual head movements for daily activity including driving, self care.   Goal status: on going 06/12/2022   5.  Patient will demonstrate bilateral shoulder ER MMT 5/5 throughout for daily work activity.   Goal status: on going 06/12/2022   6.  Patient will demonstrate/report ability to perform work and hobby s restriction due to symptoms.  Goal status: on going 06/12/2022      PLAN:  PT FREQUENCY: 1-2x/week  PT DURATION: 10 weeks  PLANNED INTERVENTIONS: Therapeutic exercises, Therapeutic activity, Neuro Muscular re-education, Balance training, Gait training, Patient/Family education, Joint mobilization, Stair training, DME instructions, Dry Needling, Electrical stimulation,  Cryotherapy, vasopneumatic device,Traction, Moist heat, Taping, Ultrasound, Ionotophoresis 4mg /ml Dexamethasone, and Manual therapy.  All included unless contraindicated  PLAN FOR NEXT SESSION:  Cervical mobilizations/thoracic mobility improvements.    Scot Jun, PT, DPT, OCS, ATC 06/12/22  9:24 AM

## 2022-06-19 ENCOUNTER — Ambulatory Visit (INDEPENDENT_AMBULATORY_CARE_PROVIDER_SITE_OTHER): Payer: Medicare Other | Admitting: Podiatry

## 2022-06-19 ENCOUNTER — Encounter: Payer: Self-pay | Admitting: Podiatry

## 2022-06-19 ENCOUNTER — Ambulatory Visit (INDEPENDENT_AMBULATORY_CARE_PROVIDER_SITE_OTHER): Payer: Medicare Other

## 2022-06-19 ENCOUNTER — Encounter: Payer: Self-pay | Admitting: Rehabilitative and Restorative Service Providers"

## 2022-06-19 ENCOUNTER — Ambulatory Visit (INDEPENDENT_AMBULATORY_CARE_PROVIDER_SITE_OTHER): Payer: Medicare Other | Admitting: Rehabilitative and Restorative Service Providers"

## 2022-06-19 DIAGNOSIS — R293 Abnormal posture: Secondary | ICD-10-CM

## 2022-06-19 DIAGNOSIS — M542 Cervicalgia: Secondary | ICD-10-CM | POA: Diagnosis not present

## 2022-06-19 DIAGNOSIS — M6281 Muscle weakness (generalized): Secondary | ICD-10-CM | POA: Diagnosis not present

## 2022-06-19 DIAGNOSIS — M7752 Other enthesopathy of left foot: Secondary | ICD-10-CM | POA: Diagnosis not present

## 2022-06-19 DIAGNOSIS — M7751 Other enthesopathy of right foot: Secondary | ICD-10-CM

## 2022-06-19 NOTE — Progress Notes (Signed)
Subjective:   Patient ID: Julian Weaver, male   DOB: 77 y.o.   MRN: [id number]   HPI Patient states she is getting a lot of pain underneath her his right foot and states that it feels like the bone is sticking out he know he had previous surgery   ROS      Objective:  Physical Exam  Neurovascular status intact with inflammation around the plantar aspect of the right first MPJ with fluid buildup and keratotic lesion and prominent bone formation     Assessment:  Inflammatory capsulitis right with patient who had bunion surgery which left him with an elevated hallux     Plan:  H&P reviewed recommended orthotics and he is casted for functional orthotics to reduce pressure did do sterile prep injected the plantar capsule 2 mg Dexasone Kenalog 5 mg Xylocaine debrided the area discussed metatarsal fusion which may be necessary at 1 point future but would like to avoid surgery  X-rays dated today do indicate that there is elevation of the right hallux significant amount which is putting pressure against the metatarsal with screws in place first metatarsal

## 2022-06-19 NOTE — Therapy (Addendum)
OUTPATIENT PHYSICAL THERAPY TREATMENT /DISCHARGE   Patient Name: Julian Weaver MRN: [id number] DOB:06/29/1945, 77 y.o., male Today's Date: 06/19/2022  END OF SESSION:  PT End of Session - 06/19/22 0813     Visit Number 4    Number of Visits 20    Date for PT Re-Evaluation 07/31/22    Authorization Type Medicare and BCBS    Progress Note Due on Visit 10    PT Start Time 0810    PT Stop Time 0835    PT Time Calculation (min) 25 min    Activity Tolerance Patient tolerated treatment well    Behavior During Therapy [facility] for tasks assessed/performed                Past Medical History:  Diagnosis Date   Allergic rhinitis    Aortic atherosclerosis (HCC)    Arthritis    Carotid artery stenosis    1-39% bilateral dopplers 05/2020   Coronary artery disease 04/13/2008   Drug eluting stent in RCA // Myoview 04/2019: EF 52, normal perfusion; low risk   Hypercholesteremia    Hypertension    RBBB    Renal disorder    Tobacco abuse    Past Surgical History:  Procedure Laterality Date   CAROTID STENT  2010   SHOULDER ACROMIOPLASTY  2010   Patient Active Problem List   Diagnosis Date Noted   Aortic atherosclerosis (HCC) 11/22/2021   Pain in left knee 05/15/2016   Swelling of left knee joint 05/15/2016   Carotid artery bruit 04/01/2016   Carotid artery stenosis    Cigarette smoker 10/27/2014   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   Left inguinal hernia 11/12/2013   Coronary atherosclerosis of native coronary artery 03/11/2013   Pure hypercholesterolemia 03/11/2013   Essential hypertension, benign 03/11/2013   Encounter for long-term (current) use of other medications 03/11/2013   Bruit 03/11/2013    PCP: Shon Hale   REFERRING PROVIDER: M54.2 (ICD-10-CM) - Neck pain  REFERRING DIAG: Eldred Manges, MD  THERAPY DIAG:  Cervicalgia  Abnormal posture  Muscle weakness (generalized)  Rationale for Evaluation and Treatment:  Rehabilitation  ONSET DATE: 05/09/2022  SUBJECTIVE:                                                                                                                                                                                                         SUBJECTIVE STATEMENT: [name] reported having no specific pain upon rest upon arrival today.  Reported tightness feeling with turning head but sometimes better movement.  PERTINENT HISTORY:  CAD/stenosis, HTN, hypercholesteremia.  He reported previous history of similar complaints.   PAIN:  NPRS scale: 0/10 pain at rest,  Pain location: cervical bilaterally Pain description: tightness/stiffness Aggravating factors: head movements (looking up, head turns)  Relieving factors: muscle relaxer, hot/cold compress  PRECAUTIONS: None  WEIGHT BEARING RESTRICTIONS: No  FALLS:  Has patient fallen in last 6 months? No  LIVING ENVIRONMENT:  Lives in: House/apartment Stairs: 4-5 to enter house  OCCUPATION: Holiday representative - various physical jobs (reported limitation due to symptoms)  PLOF: Independent, Rt hand dominant, carve wood  PATIENT GOALS: Reduce pain  OBJECTIVE:  PATIENT SURVEYS:  05/22/2022 FOTO intake:48    predicted:  64  COGNITION: 05/22/2022 Overall cognitive status: Within functional limits for tasks assessed  SENSATION: 05/22/2022 [facility]  POSTURE:  05/22/2022 : Increased tension in neck muscles and shoulders  PALPATION: 05/22/2022 Mild tenderness in bilateral upper trap, cervical paraspinals. Mobility deficits in passive supine cervical downslope Lt > Rt throughout cervical region.    CERVICAL ROM:   ROM AROM (deg) 05/22/2022 AROM 06/12/2022 AROM 06/19/2022  Flexion 56 c posterior cervical pain 50   Extension 45 c posterior cervical pain 50   Right lateral flexion     Left lateral flexion     Right rotation 46 45 50 , 54 after manual  Left rotation 40 c Lt cervical pain 45 50, 55 after manual   (Blank rows = not  tested)  UPPER EXTREMITY MMT:  MMT Right 05/22/2022 Left 05/22/2022  Shoulder flexion 5/5 5/5  Shoulder extension    Shoulder abduction 5/5 5/5  Shoulder adduction    Shoulder extension    Shoulder internal rotation 5/5 5/5  Shoulder external rotation 4+/5 4+/5  Elbow flexion    Elbow extension    Wrist flexion    Wrist extension    Wrist ulnar deviation    Wrist radial deviation    Wrist pronation    Wrist supination     (Blank rows = not tested)   CERVICAL SPECIAL TESTS:  05/22/2022 Cervical distraction:   Spurling's : not indicated  FUNCTIONAL TESTS:  05/22/2022 No specific testing.    TODAY'S TREATMENT:                                                                    DATE: 06/19/2022:  Manual: Prone unilateral PA C4-C7 bilateral.  Prone cPA G3-G4 mid thoracic   Therex: UBE fwd/back 3 mins each way lvl 2.5 Seated towel assisted rotation cervical Lt and Rt 5 sec hold x 10 each Corner stretch 15 sec x 5  Thoracic extension in chair 5 sec hold x 10    TODAY'S TREATMENT:                                                                    DATE: 06/12/2022:  Manual: Supine cervical downslope mobs throughout cervical region Lt and Rt g3-g4.  Prone cPA G3-G4 mid thoracic   Therex: Seated towel assisted rotation cervical Lt and Rt 5  sec hold x 10 each Corner stretch 15 sec x 5  Seated thoracic extension AROM in chair 3 sec hold x 10 UBE fwd/back 3 mins each way lvl 2.0 Tband Rows green band 2 x 15 Tband GH ext green band 2 x 15 Standing tband ER c towel under arm green band 2 x 10 bilateral  Verbal cues for additions to HEP. Handout provided   TODAY'S TREATMENT:                                                                    DATE: 06/05/22:  TherEx:  -UBE: Level 1 x  2 minutes each direction -scapular squeezes x 10 -Rows: Level 3 band 2 x 10 holding 3 sec -cervical rotation in sitting x 5  -levator stretch x 5 holding 10 sec -Corner stretch x 5 holding 20  sec -upper trap stretch x 3 holding 10 sec -supine cervical retraction x 10 holding 5 sec -AAROM shoulder flexion 1 # bar x 10  Manual: -cervical PROM in supine -occipitical release, gentle Grade 2 cervical PA mobs Modalities:  -moist heat x 10 to cervical spine.     TODAY'S TREATMENT:                                                                    DATE: 05/22/2022 Therex:    HEP instruction/performance c cues for techniques, handout provided.  Trial set performed of each for comprehension and symptom assessment.  See below for exercise list  PATIENT EDUCATION:  06/12/2022 Education details: HEP update Person educated: Patient Education method: Explanation, Demonstration, Verbal cues, and Handouts Education comprehension: verbalized understanding, returned demonstration, and verbal cues required  HOME EXERCISE PROGRAM: Access Code: [email or web address] URL: [email or web address] Date: 06/12/2022 Prepared by: Chyrel Masson  Exercises - Shoulder External Rotation and Scapular Retraction  - 3-5 x daily - 7 x weekly - 1 sets - 5-10 reps - 2 hold - Supine Deep Neck Flexor Training  - 2 x daily - 7 x weekly - 1 sets - 10 reps - 5 hold - Supine Cervical Rotation AROM on Pillow  - 1-2 x daily - 7 x weekly - 1 sets - 10 reps - Seated Assisted Cervical Rotation with Towel  - 2-3 x daily - 7 x weekly - 1 sets - 10 reps - 2-3 hold - Seated Assisted Cervical Rotation with Towel  - 2-3 x daily - 7 x weekly - 1 sets - 10 reps - 2-3 hold - Corner Pec Major Stretch  - 1-2 x daily - 7 x weekly - 1 sets - 3-5 reps - 15 hold - Seated Thoracic Lumbar Extension  - 1-2 x daily - 7 x weekly - 1 sets - 10-15 reps - 3 hold  ASSESSMENT:  CLINICAL IMPRESSION: Measurements today showed improvement compared to previous for rotation and improvement noted after manual performance.  Continued mobility improvements to aid functional movements of neck and back in daily activity.    OBJECTIVE IMPAIRMENTS:  decreased activity tolerance, decreased coordination,  decreased endurance, decreased mobility, decreased ROM, decreased strength, hypomobility, increased fascial restrictions, impaired perceived functional ability, increased muscle spasms, impaired flexibility, improper body mechanics, postural dysfunction, and pain.   ACTIVITY LIMITATIONS: carrying, lifting, and reach over head  PARTICIPATION LIMITATIONS: interpersonal relationship, driving, community activity, occupation, and yard work  PERSONAL FACTORS:  CAD/stenosis, HTN, hypercholesteremia   are also affecting patient's functional outcome.   REHAB POTENTIAL: Good  CLINICAL DECISION MAKING: Stable/uncomplicated  EVALUATION COMPLEXITY: Low   GOALS: Goals reviewed with patient? Yes  SHORT TERM GOALS: (target date for Short term goals are 3 weeks 06/12/2022)  1.Patient will demonstrate independent use of home exercise program to maintain progress from in clinic treatments. Goal status: Met  LONG TERM GOALS: (target dates for all long term goals are 10 weeks  07/31/2022 )   1. Patient will demonstrate/report pain at worst less than or equal to 2/10 to facilitate minimal limitation in daily activity secondary to pain symptoms. Goal status: on going 06/12/2022   2. Patient will demonstrate independent use of home exercise program to facilitate ability to maintain/progress functional gains from skilled physical therapy services. Goal status: on going 06/12/2022   3. Patient will demonstrate FOTO outcome > or = 64 % to indicate reduced disability due to condition. Goal status: on going 06/12/2022   4.  Patient will demonstrate cervical AROM improved 20 degrees or better for each direction s symptoms to facilitate usual head movements for daily activity including driving, self care.   Goal status: on going 06/12/2022   5.  Patient will demonstrate bilateral shoulder ER MMT 5/5 throughout for daily work activity.   Goal status: on going  06/12/2022   6.  Patient will demonstrate/report ability to perform work and hobby s restriction due to symptoms.  Goal status: on going 06/12/2022      PLAN:  PT FREQUENCY: 1-2x/week  PT DURATION: 10 weeks  PLANNED INTERVENTIONS: Therapeutic exercises, Therapeutic activity, Neuro Muscular re-education, Balance training, Gait training, Patient/Family education, Joint mobilization, Stair training, DME instructions, Dry Needling, Electrical stimulation, Cryotherapy, vasopneumatic device,Traction, Moist heat, Taping, Ultrasound, Ionotophoresis 4mg /ml Dexamethasone, and Manual therapy.  All included unless contraindicated  PLAN FOR NEXT SESSION:  Cervical mobilizations/thoracic mobility improvements to promote cervical mobility gains functionally.    Chyrel Masson, PT, DPT, OCS, ATC 06/19/22  8:34 AM   PHYSICAL THERAPY DISCHARGE SUMMARY  Visits from Start of Care: 4  Current functional level related to goals / functional outcomes: See note   Remaining deficits: See note   Education / Equipment: HEP  Patient goals were partially met. Patient is being discharged due to not returning since the last visit.  Chyrel Masson, PT, DPT, OCS, ATC 07/25/22  1:28 PM

## 2022-06-25 ENCOUNTER — Ambulatory Visit (INDEPENDENT_AMBULATORY_CARE_PROVIDER_SITE_OTHER): Payer: Medicare Other | Admitting: Orthopaedic Surgery

## 2022-06-25 ENCOUNTER — Encounter: Payer: Self-pay | Admitting: Orthopaedic Surgery

## 2022-06-25 VITALS — BP 135/63 | HR 66 | Ht 68.0 in | Wt 180.0 lb

## 2022-06-25 DIAGNOSIS — M47812 Spondylosis without myelopathy or radiculopathy, cervical region: Secondary | ICD-10-CM

## 2022-06-25 NOTE — Progress Notes (Signed)
Office Visit Note   Patient: Julian Weaver           Date of Birth: 1946/02/14           MRN: [id number] Visit Date: 06/25/2022              Requested by: Glenis Smoker, MD [address],  [place] [address] PCP: Glenis Smoker, MD   Assessment & Plan: Visit Diagnoses:  1. Spondylosis without myelopathy or radiculopathy, cervical region     Plan: Patient will continue his exercise program.  We reviewed plain radiographs today with spondylosis.  If he gets increasing symptoms we will proceed with MRI otherwise recheck 3 months.  Pathophysiology discussed and symptoms that would be of concern were discussed in detail.  Follow-Up Instructions: Return in about 3 months (around 09/24/2022).   Orders:  No orders of the defined types were placed in this encounter.  No orders of the defined types were placed in this encounter.     Procedures: No procedures performed   Clinical Data: No additional findings.   Subjective: Chief Complaint  Patient presents with   Neck - Pain, Follow-up    HPI 77 year old male returns for ongoing problems with cervical spondylosis.  He had some anterolisthesis at C4-5 significant spondylitic changes at C5-6 and narrowing at C6-7 less severe.  He states that therapy has helped when he started having increased symptoms he is to resume doing his exercises as instructed by physical therapy.  Overall he feels like he is stable and his symptoms have not progressed.  Therapy efforts have been helpful.  Review of Systems 14 point system update unchanged from 05/14/2022 office visit.   Objective: Vital Signs: BP 135/63   Pulse 66   Ht 5\' 8"  (1.727 m)   Wt 180 lb (81.6 kg)   BMI 27.37 kg/m   Physical Exam Constitutional:      Appearance: He is well-developed.  HENT:     Head: Normocephalic and atraumatic.     Right Ear: External ear normal.     Left Ear: External ear normal.  Eyes:     Pupils: Pupils are  equal, round, and reactive to light.  Neck:     Thyroid: No thyromegaly.     Trachea: No tracheal deviation.  Cardiovascular:     Rate and Rhythm: Normal rate.  Pulmonary:     Effort: Pulmonary effort is normal.     Breath sounds: No wheezing.  Abdominal:     General: Bowel sounds are normal.     Palpations: Abdomen is soft.  Musculoskeletal:     Cervical back: Neck supple.  Skin:    General: Skin is warm and dry.     Capillary Refill: Capillary refill takes less than 2 seconds.  Neurological:     Mental Status: He is alert and oriented to person, place, and time.  Psychiatric:        Behavior: Behavior normal.        Thought Content: Thought content normal.        Judgment: Judgment normal.     Ortho Exam patient has decreased rotation of cervical spine now about 50% of normal improvement 25% last visit.  Some brachial plexus tenderness right and left.  Good grip strength right and left normal heel-toe gait no lower extremity clonus.  Specialty Comments:  No specialty comments available.  Imaging: No results found.   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/2014   Left inguinal hernia 11/12/2013   Coronary atherosclerosis of native coronary artery 03/11/2013   Pure hypercholesterolemia 03/11/2013   Essential hypertension, benign 03/11/2013   Encounter for long-term (current) use of other medications 03/11/2013   Bruit 03/11/2013   Past Medical History:  Diagnosis Date   Allergic rhinitis    Aortic atherosclerosis    Arthritis    Carotid artery stenosis    1-39% bilateral dopplers 05/2020   Coronary artery disease  04/13/2008   Drug eluting stent in RCA // Myoview 04/2019: EF 52, normal perfusion; low risk   Hypercholesteremia    Hypertension    RBBB    Renal disorder    Tobacco abuse     Family History  Problem Relation Age of Onset   Stroke Mother    Stroke Father     Past Surgical History:  Procedure Laterality Date   CAROTID STENT  2010   SHOULDER ACROMIOPLASTY  2010   Social History   Occupational History   Not on file  Tobacco Use   Smoking status: Every Day    Packs/day: 1.00    Years: 56.00    Additional pack years: 0.00    Total pack years: 56.00    Types: Cigarettes    Start date: 10/27/1982   Smokeless tobacco: Never   Tobacco comments:    Half-pack daily 11/09/2020  Substance and Sexual Activity   Alcohol use: Yes    Alcohol/week: 1.0 standard drink of alcohol    Types: 1 Cans of beer per week    Comment: Rarely- 3 times a month   Drug use: No   Sexual activity: Not on file

## 2022-06-26 ENCOUNTER — Encounter: Payer: Medicare Other | Admitting: Rehabilitative and Restorative Service Providers"

## 2022-07-23 ENCOUNTER — Telehealth: Payer: Self-pay | Admitting: Podiatry

## 2022-07-23 NOTE — Telephone Encounter (Signed)
Lmom to call back to schedule picking up orthotics    Pending insurance

## 2022-07-24 DIAGNOSIS — Z6825 Body mass index (BMI) 25.0-25.9, adult: Secondary | ICD-10-CM | POA: Diagnosis not present

## 2022-07-24 DIAGNOSIS — M5412 Radiculopathy, cervical region: Secondary | ICD-10-CM | POA: Diagnosis not present

## 2022-07-31 DIAGNOSIS — I700 Atherosclerosis of aorta: Secondary | ICD-10-CM | POA: Diagnosis not present

## 2022-07-31 DIAGNOSIS — I480 Paroxysmal atrial fibrillation: Secondary | ICD-10-CM | POA: Diagnosis not present

## 2022-07-31 DIAGNOSIS — I10 Essential (primary) hypertension: Secondary | ICD-10-CM | POA: Diagnosis not present

## 2022-07-31 DIAGNOSIS — I2510 Atherosclerotic heart disease of native coronary artery without angina pectoris: Secondary | ICD-10-CM | POA: Diagnosis not present

## 2022-07-31 DIAGNOSIS — R7309 Other abnormal glucose: Secondary | ICD-10-CM | POA: Diagnosis not present

## 2022-07-31 DIAGNOSIS — D6869 Other thrombophilia: Secondary | ICD-10-CM | POA: Diagnosis not present

## 2022-07-31 DIAGNOSIS — J449 Chronic obstructive pulmonary disease, unspecified: Secondary | ICD-10-CM | POA: Diagnosis not present

## 2022-07-31 DIAGNOSIS — Z720 Tobacco use: Secondary | ICD-10-CM | POA: Diagnosis not present

## 2022-08-02 ENCOUNTER — Telehealth: Payer: Self-pay | Admitting: Cardiology

## 2022-08-02 NOTE — Telephone Encounter (Signed)
New Message:   Talbert Forest says Dr Chanetta Marshall have stopped the patient's Amlodipine due to lightheaded upon standing. His Blood Pressure was 113/53.      Pt c/o medication issue:  1. Name of Medication: Amlodipine  2. How are you currently taking this medication (dosage and times per day)? 1 tablet daily   3. Are you having a reaction (difficulty breathing--STAT)?    4. What is your medication issue? Patient was lightheaded

## 2022-08-02 NOTE — Telephone Encounter (Signed)
Pt c/o medication issue:  1. Name of Medication:   amLODipine (NORVASC) 5 MG tablet   2. How are you currently taking this medication (dosage and times per day)?   As prescribed  3. Are you having a reaction (difficulty breathing--STAT)?  Patient stated his heart slowed down BP 113/30 (today)  4. What is your medication issue?   Patient states his primary care wants him to come off this medication and he wants Dr. Norris Cross advice on this.

## 2022-08-05 NOTE — Telephone Encounter (Signed)
Called patient to discuss PCP wanting to take him off amlodipine. Patient states he has been taking his amlodipine and metoprolol as ordered. He states that he has not needed to take any of his PRN medications such as nitro, flexoril, vicodin that can lower BP in "a long time". He states he does have experience occasional lightheadedness which is why his PCP was concerned. Of note, patient states he drinks 4-6 cups of coffee a day and 2x20 oz bottles of water or gatorade a day.  Patient states he does keep a BP log at home but he doesn't have it with him. Patient states he will call tomorrow with that info so it can be forwarded to Dr. Mayford Knife.

## 2022-08-05 NOTE — Telephone Encounter (Signed)
Patient states his BP last night 08/04/22 was 135/65, HR 68. He states 2 nights ago it was 115 systolic but he does not remember any other readings. Patient states he will call with more readings tomorrow.

## 2022-08-08 NOTE — Telephone Encounter (Signed)
Called patient back to check BP log. Patient endorses taking 12.5 mg metoprolol tartrate BID and amlodipine 5 mg daily as ordered. He reports the following readings:  08/04/22:  10 pm 134/58 HR 65  08/05/22: 9 pm 130/69  HR 58  08/06/22: 7:45 AM 124/68 HR 126 (before medications)  08/07/22: 7:45 AM 126/58 HR 55 (before medications)  Patient originally called in asking to discontinue amlodipine because he hadn't been feeling well. He states he had some dizziness and "feeling like something was off" from 08/04/22-08/06/22. He states he decreased his coffee intake and increased his water intake the last few days and has felt better yesterday and today. Forwarded patient responses to MD.

## 2022-08-19 ENCOUNTER — Other Ambulatory Visit: Payer: Self-pay | Admitting: Acute Care

## 2022-08-19 DIAGNOSIS — Z87891 Personal history of nicotine dependence: Secondary | ICD-10-CM

## 2022-08-19 DIAGNOSIS — Z122 Encounter for screening for malignant neoplasm of respiratory organs: Secondary | ICD-10-CM

## 2022-08-19 DIAGNOSIS — F17210 Nicotine dependence, cigarettes, uncomplicated: Secondary | ICD-10-CM

## 2022-08-20 NOTE — Progress Notes (Unsigned)
Office Visit    Patient Name: Julian Weaver Date of Encounter: 08/21/2022  PCP:  Shon Hale, MD   [facility]  Cardiologist:  Armanda Magic, MD  Advanced Practice Provider:  No care team member to display Electrophysiologist:  None   HPI    Julian Weaver is a 77 y.o. male with a past medical history of CAD status post PCI/DES to the RCA in 2010, hypertension, hyperlipidemia, tobacco abuse, carotid bruit 1 to 39% with Dopplers 04/2017.  2D echocardiogram which showed normal LVEF with G1 DD.  Nuclear stress test showed no ischemia.  Also had PAF and was seen by the atrial fibrillation clinic and placed on apixaban Lopressor.  Presents today for follow-up appointment.  He was last seen August 2023 by Dr. Mayford Knife and was doing well at that time.  Denied chest pain/pressure, SOB, DOE, PND, orthopnea, lower extremity edema, dizziness, palpitations or syncope.  Compliant with medications.  Today, he tells me he checks his blood pressure twice a day.  Brings in a log today.  Most blood pressures running late from 110-140 systolic.  He is still working in [employer] operational [employer].  He is talked to one of our EP physicians about ablation in the past and decided against it.  Would prefer to continue medications since it is keeping him well-controlled.  He does take an extra half of metoprolol when he feels like he is heart rate is elevated.  He is an 82 slow, rate controlled atrial fibrillation with today.  Reviewed most recent lab work and his hemoglobin a few weeks ago was 10.5.  Will need this rechecked in a couple of months when he sees Jorja Loa at the A-fib clinic.  No bleeding that he is aware of.  Reports no shortness of breath nor dyspnea on exertion. Reports no chest pain, pressure, or tightness. No edema, orthopnea, PND.   Past Medical History    Past Medical History:  Diagnosis Date   Allergic rhinitis    Aortic  atherosclerosis (HCC)    Arthritis    Carotid artery stenosis    1-39% bilateral dopplers 05/2020   Coronary artery disease 04/13/2008   Drug eluting stent in RCA // Myoview 04/2019: EF 52, normal perfusion; low risk   Hypercholesteremia    Hypertension    RBBB    Renal disorder    Tobacco abuse    Past Surgical History:  Procedure Laterality Date   CAROTID STENT  2010   SHOULDER ACROMIOPLASTY  2010    Allergies  Allergies  Allergen Reactions   Bee Venom Swelling   Amoxicillin-Pot Clavulanate Nausea And Vomiting    Severe nausea and vomiting   Hydromorphone     vomiting   Tramadol Nausea Only   EKGs/Labs/Other Studies Reviewed:   The following studies were reviewed today: Cardiac Studies & Procedures     STRESS TESTS  MYOCARDIAL PERFUSION IMAGING 04/27/2019  Narrative  Nuclear stress EF: 52%.  Blood pressure demonstrated a normal response to exercise.  There was no ST segment deviation noted during stress.  No T wave inversion was noted during stress.  The study is normal.  This is a low risk study.  The left ventricular ejection fraction is mildly decreased (45-54%).   ECHOCARDIOGRAM  ECHOCARDIOGRAM COMPLETE 10/31/2020  Narrative ECHOCARDIOGRAM REPORT    Patient Name:   Julian Weaver Date of Exam: 10/31/2020 Medical Rec #:  [id number]  Height:       69.0 in Accession #:    [id number]              Weight:       171.2 lb Date of Birth:  1946/01/01               BSA:          1.934 m Patient Age:    75 years                BP:           120/62 mmHg Patient Gender: M                       HR:           61 bpm. Exam Location:  [address]  Procedure: 3D Echo, 2D Echo, Cardiac Doppler, Color Doppler and Strain Analysis  Indications:    I48 Atrial fibrillation  History:        Patient has prior history of Echocardiogram examinations, most recent 04/27/2019. Carotid Disease; Risk Factors:Hypertension, Current Smoker and  Dyslipidemia.  Sonographer:    Garald Braver, RDCS Referring Phys: [phone] TRACI R TURNER  IMPRESSIONS   1. Left ventricular ejection fraction, by estimation, is 55 to 60%. The left ventricle has normal function. Left ventricular endocardial border not optimally defined to evaluate regional wall motion. Left ventricular diastolic parameters are consistent with Grade I diastolic dysfunction (impaired relaxation). The average left ventricular global longitudinal strain is -23.0 %. The global longitudinal strain is normal. 2. Right ventricular systolic function is normal. The right ventricular size is normal. 3. The mitral valve is grossly normal. Trivial mitral valve regurgitation. 4. The aortic valve is tricuspid. Aortic valve regurgitation is not visualized. 5. The inferior vena cava is normal in size with greater than 50% respiratory variability, suggesting right atrial pressure of 3 mmHg.  Comparison(s): No significant change from prior study. 04/27/19 EF 55-60%. GLS -20.2%.  FINDINGS Left Ventricle: Left ventricular ejection fraction, by estimation, is 55 to 60%. The left ventricle has normal function. Left ventricular endocardial border not optimally defined to evaluate regional wall motion. The average left ventricular global longitudinal strain is -23.0 %. The global longitudinal strain is normal. The left ventricular internal cavity size was normal in size. There is no left ventricular hypertrophy. Left ventricular diastolic parameters are consistent with Grade I diastolic dysfunction (impaired relaxation). Indeterminate filling pressures.  Right Ventricle: The right ventricular size is normal. No increase in right ventricular wall thickness. Right ventricular systolic function is normal.  Left Atrium: Left atrial size was normal in size.  Right Atrium: Right atrial size was normal in size.  Pericardium: There is no evidence of pericardial effusion.  Mitral Valve: The mitral valve is  grossly normal. Trivial mitral valve regurgitation.  Tricuspid Valve: The tricuspid valve is grossly normal. Tricuspid valve regurgitation is trivial.  Aortic Valve: The aortic valve is tricuspid. Aortic valve regurgitation is not visualized.  Pulmonic Valve: The pulmonic valve was normal in structure. Pulmonic valve regurgitation is not visualized.  Aorta: The aortic root and ascending aorta are structurally normal, with no evidence of dilitation.  Venous: The inferior vena cava is normal in size with greater than 50% respiratory variability, suggesting right atrial pressure of 3 mmHg.  IAS/Shunts: No atrial level shunt detected by color flow Doppler.   LEFT VENTRICLE PLAX 2D LVIDd:         5.70 cm  Diastology LVIDs:         3.80 cm  LV e' medial:    8.51 cm/s LV PW:         0.60 cm  LV E/e' medial:  10.2 LV IVS:        0.80 cm  LV e' lateral:   9.57 cm/s LVOT diam:     2.20 cm  LV E/e' lateral: 9.1 LV SV:         94 LV SV Index:   49       2D Longitudinal Strain LVOT Area:     3.80 cm 2D Strain GLS (A2C):   -28.4 % 2D Strain GLS (A3C):   -16.7 % 2D Strain GLS (A4C):   -24.0 % 2D Strain GLS Avg:     -23.0 %  3D Volume EF: 3D EF:        58 % LV EDV:       144 ml LV ESV:       60 ml LV SV:        83 ml  RIGHT VENTRICLE RV Basal diam:  3.20 cm RV S prime:     11.20 cm/s TAPSE (M-mode): 2.5 cm  LEFT ATRIUM             Index       RIGHT ATRIUM           Index LA diam:        4.30 cm 2.22 cm/m  RA Pressure: 3.00 mmHg LA Vol (A2C):   38.1 ml 19.70 ml/m RA Area:     15.00 cm LA Vol (A4C):   28.0 ml 14.48 ml/m RA Volume:   37.60 ml  19.44 ml/m LA Biplane Vol: 34.4 ml 17.79 ml/m AORTIC VALVE LVOT Vmax:   102.00 cm/s LVOT Vmean:  69.900 cm/s LVOT VTI:    0.248 m  AORTA Ao Root diam: 3.50 cm Ao Asc diam:  3.40 cm  MV E velocity: 87.00 cm/s  TRICUSPID VALVE MV A velocity: 75.80 cm/s  Estimated RAP:  3.00 mmHg MV E/A ratio:  1.15 SHUNTS Systemic VTI:  0.25  m Systemic Diam: 2.20 cm  Zoila Shutter MD Electronically signed by Zoila Shutter MD Signature Date/Time: 10/31/2020/10:14:03 AM    Final    MONITORS  CARDIAC EVENT MONITOR 10/13/2020  Narrative  Predominant rhythm is normal sinus rhythm with average heart rate 60bpm and ranged from 46 to 206bpm.  Paroxyxmal atrial fibrillation with RVR up to 206bpm with possible aberrancy  Wide complex tachycardia up to 5 beats.            EKG:  EKG is not ordered today.   Recent Labs: 02/06/2022: ALT 21  Recent Lipid Panel    Component Value Date/Time   CHOL 91 (L) 02/06/2022 0855   TRIG 72 02/06/2022 0855   HDL 39 (L) 02/06/2022 0855   CHOLHDL 2.3 02/06/2022 0855   CHOLHDL 2.5 05/24/2015 0816   VLDL 16 05/24/2015 0816   LDLCALC 37 02/06/2022 0855    Risk Assessment/Calculations:   CHA2DS2-VASc Score = 3   This indicates a 3.2% annual risk of stroke. The patient's score is based upon: CHF History: 0 HTN History: 1 Diabetes History: 0 Stroke History: 0 Vascular Disease History: 1 Age Score: 1 Gender Score: 0    Home Medications   Current Meds  Medication Sig   albuterol (VENTOLIN HFA) 108 (90 Base) MCG/ACT inhaler SMARTSIG:2 Puff(s) By Mouth Every 4 Hours PRN   amLODipine (NORVASC)  5 MG tablet Take 1 tablet (5 mg total) by mouth daily.   apixaban (ELIQUIS) 5 MG TABS tablet Take 1 tablet (5 mg total) by mouth 2 (two) times daily.   aspirin EC 81 MG tablet Take 81 mg by mouth daily. Swallow whole.   atorvastatin (LIPITOR) 20 MG tablet TAKE 1 TABLET(20 MG) BY MOUTH DAILY   Coenzyme Q10 (CO Q-10) 100 MG CAPS Take 1 capsule by mouth every morning.   cyanocobalamin (VITAMIN B12) 1000 MCG tablet Take 1,000 mcg by mouth daily.   EPINEPHrine (EPIPEN IJ) Inject as directed as needed (bee stings).    Fluticasone-Umeclidin-Vilant 100-62.5-25 MCG/ACT AEPB Inhale 1 puff into the lungs at bedtime. Not sure the dosage   HYDROcodone-acetaminophen (NORCO/VICODIN) 5-325 MG tablet Take  1-2 tablets by mouth every 6 (six) hours as needed.   metoprolol tartrate (LOPRESSOR) 25 MG tablet Take 0.5 tablets (12.5 mg total) by mouth 2 (two) times daily.   MULTAQ 400 MG tablet TAKE 1 TABLET(400 MG) BY MOUTH TWICE DAILY WITH A MEAL   nitroGLYCERIN (NITROSTAT) 0.4 MG SL tablet Place 1 tablet (0.4 mg total) under the tongue every 5 (five) minutes as needed for chest pain.   omeprazole (PRILOSEC) 20 MG capsule Take 20 mg by mouth as needed.   Current Facility-Administered Medications for the 08/21/22 encounter (Office Visit) with Sharlene Dory, PA-C  Medication   triamcinolone acetonide (KENALOG) 10 MG/ML injection 10 mg     Review of Systems      All other systems reviewed and are otherwise negative except as noted above.  Physical Exam    VS:  BP (!) 106/52   Pulse (!) 49   Ht 5\' 8"  (1.727 m)   Wt 176 lb (79.8 kg)   SpO2 98%   BMI 26.76 kg/m  , BMI Body mass index is 26.76 kg/m.  Wt Readings from Last 3 Encounters:  08/21/22 176 lb (79.8 kg)  06/25/22 180 lb (81.6 kg)  05/14/22 180 lb (81.6 kg)     GEN: Well nourished, well developed, in no acute distress. HEENT: normal. Neck: Supple, no JVD, carotid bruits, or masses. Cardiac: Irregularly irregular, no murmurs, rubs, or gallops. No clubbing, cyanosis, edema.  Radials/PT 2+ and equal bilaterally.  Respiratory:  Respirations regular and unlabored, clear to auscultation bilaterally. GI: Soft, nontender, nondistended. MS: No deformity or atrophy. Skin: Warm and dry, no rash. Neuro:  Strength and sensation are intact. Psych: Normal affect.  Assessment & Plan    Paroxysmal atrial fibrillation -slow Afib today -follow-up with Afib clinic in a few months -continue Multaq 400mg  BID -Continue Eliquis 5 mg twice a day -Repeat CBC in a few months (hemoglobin was 10.5 when it was checked a few weeks ago) -Continue metoprolol to tartrate 12.5 mg twice a day  Bilateral carotid artery stenosis -may need updated [place] next  time he is in to see [place] -asymptomatic at this time -last [place] 2022 with mild bilateral disease  Essential hypertension -He brings in a blood pressure log today and most blood pressures are in the 120s to 140s systolic -Not make any medication changes, blood pressure 106/52 today in the clinic -continue current medications  Pure hyperlipidemia -Most recent LDL 37 -Will be due for repeat lipid panel to follow -Continue current medications which include Lipitor 20 mg daily         Disposition: Follow up 6 months with Armanda Magic, MD or APP.  Signed, Sharlene Dory, PA-C 08/21/2022, 9:52 AM [facility]

## 2022-08-21 ENCOUNTER — Encounter: Payer: Self-pay | Admitting: Physician Assistant

## 2022-08-21 ENCOUNTER — Ambulatory Visit: Payer: Medicare Other | Attending: Physician Assistant | Admitting: Physician Assistant

## 2022-08-21 VITALS — BP 106/52 | HR 49 | Ht 68.0 in | Wt 176.0 lb

## 2022-08-21 DIAGNOSIS — I480 Paroxysmal atrial fibrillation: Secondary | ICD-10-CM | POA: Insufficient documentation

## 2022-08-21 DIAGNOSIS — I4891 Unspecified atrial fibrillation: Secondary | ICD-10-CM | POA: Insufficient documentation

## 2022-08-21 DIAGNOSIS — E785 Hyperlipidemia, unspecified: Secondary | ICD-10-CM | POA: Insufficient documentation

## 2022-08-21 DIAGNOSIS — I6523 Occlusion and stenosis of bilateral carotid arteries: Secondary | ICD-10-CM | POA: Diagnosis not present

## 2022-08-21 DIAGNOSIS — I2510 Atherosclerotic heart disease of native coronary artery without angina pectoris: Secondary | ICD-10-CM | POA: Diagnosis not present

## 2022-08-21 NOTE — Patient Instructions (Signed)
Medication Instructions:   Your physician recommends that you continue on your current medications as directed. Please refer to the Current Medication list given to you today.   *If you need a refill on your cardiac medications before your next appointment, please call your pharmacy*   Lab Work:  MAKE SURE YOU GET CBC LABS ON OFFICE VISIT WITH RICKY FENTON.   If you have labs (blood work) drawn today and your tests are completely normal, you will receive your results only by: MyChart Message (if you have MyChart) OR A paper copy in the mail If you have any lab test that is abnormal or we need to change your treatment, we will call you to review the results.   Testing/Procedures: NONE ORDERED  TODAY    Follow-Up: At [facility], you and your health needs are our priority.  As part of our continuing mission to provide you with exceptional heart care, we have created designated Provider Care Teams.  These Care Teams include your primary Cardiologist (physician) and Advanced Practice Providers (APPs -  Physician Assistants and Nurse Practitioners) who all work together to provide you with the care you need, when you need it.  We recommend signing up for the patient portal called "MyChart".  Sign up information is provided on this After Visit Summary.  MyChart is used to connect with patients for Virtual Visits (Telemedicine).  Patients are able to view lab/test results, encounter notes, upcoming appointments, etc.  Non-urgent messages can be sent to your provider as well.   To learn more about what you can do with MyChart, go to [email or web address].    Your next appointment:    6 month(s)  Provider:    Armanda Magic, MD     Other Instructions  Heart-Healthy Eating Plan Eating a healthy diet is important for the health of your heart. A heart-healthy eating plan includes: Eating less unhealthy fats. Eating more healthy fats. Eating less salt in your food. Salt is also  called sodium. Making other changes in your diet. Talk with your doctor or a diet specialist (dietitian) to create an eating plan that is right for you. What is my plan? Your doctor may recommend an eating plan that includes: Total fat: ______% or less of total calories a day. Saturated fat: ______% or less of total calories a day. Cholesterol: less than _________mg a day. Sodium: less than _________mg a day. What are tips for following this plan? Cooking Avoid frying your food. Try to bake, boil, grill, or broil it instead. You can also reduce fat by: Removing the skin from poultry. Removing all visible fats from meats. Steaming vegetables in water or broth. Meal planning  At meals, divide your plate into four equal parts: Fill one-half of your plate with vegetables and green salads. Fill one-fourth of your plate with whole grains. Fill one-fourth of your plate with lean protein foods. Eat 2-4 cups of vegetables per day. One cup of vegetables is: 1 cup (91 g) broccoli or cauliflower florets. 2 medium carrots. 1 large bell pepper. 1 large sweet potato. 1 large tomato. 1 medium white potato. 2 cups (150 g) raw leafy greens. Eat 1-2 cups of fruit per day. One cup of fruit is: 1 small apple 1 large banana 1 cup (237 g) mixed fruit, 1 large orange,  cup (82 g) dried fruit, 1 cup (240 mL) 100% fruit juice. Eat more foods that have soluble fiber. These are apples, broccoli, carrots, beans, peas, and barley. Try to get  20-30 g of fiber per day. Eat 4-5 servings of nuts, legumes, and seeds per week: 1 serving of dried beans or legumes equals  cup (90 g) cooked. 1 serving of nuts is  oz (12 almonds, 24 pistachios, or 7 walnut halves). 1 serving of seeds equals  oz (8 g). General information Eat more home-cooked food. Eat less restaurant, buffet, and fast food. Limit or avoid alcohol. Limit foods that are high in starch and sugar. Avoid fried foods. Lose weight if you are  overweight. Keep track of how much salt (sodium) you eat. This is important if you have high blood pressure. Ask your doctor to tell you more about this. Try to add vegetarian meals each week. Fats Choose healthy fats. These include olive oil and canola oil, flaxseeds, walnuts, almonds, and seeds. Eat more omega-3 fats. These include salmon, mackerel, sardines, tuna, flaxseed oil, and ground flaxseeds. Try to eat fish at least 2 times each week. Check food labels. Avoid foods with trans fats or high amounts of saturated fat. Limit saturated fats. These are often found in animal products, such as meats, butter, and cream. These are also found in plant foods, such as palm oil, palm kernel oil, and coconut oil. Avoid foods with partially hydrogenated oils in them. These have trans fats. Examples are stick margarine, some tub margarines, cookies, crackers, and other baked goods. What foods should I eat? Fruits All fresh, canned (in natural juice), or frozen fruits. Vegetables Fresh or frozen vegetables (raw, steamed, roasted, or grilled). Green salads. Grains Most grains. Choose whole wheat and whole grains most of the time. Rice and pasta, including brown rice and pastas made with whole wheat. Meats and other proteins Lean, well-trimmed beef, veal, pork, and lamb. Chicken and [place] without skin. All fish and shellfish. Wild duck, rabbit, pheasant, and venison. Egg whites or low-cholesterol egg substitutes. Dried beans, peas, lentils, and tofu. Seeds and most nuts. Dairy Low-fat or nonfat cheeses, including ricotta and mozzarella. Skim or 1% milk that is liquid, powdered, or evaporated. Buttermilk that is made with low-fat milk. Nonfat or low-fat yogurt. Fats and oils Non-hydrogenated (trans-free) margarines. Vegetable oils, including soybean, sesame, sunflower, olive, peanut, safflower, corn, canola, and cottonseed. Salad dressings or mayonnaise made with a vegetable oil. Beverages Mineral  water. Coffee and tea. Diet carbonated beverages. Sweets and desserts Sherbet, gelatin, and fruit ice. Small amounts of dark chocolate. Limit all sweets and desserts. Seasonings and condiments All seasonings and condiments. The items listed above may not be a complete list of foods and drinks you can eat. Contact a dietitian for more options. What foods should I avoid? Fruits Canned fruit in heavy syrup. Fruit in cream or butter sauce. Fried fruit. Limit coconut. Vegetables Vegetables cooked in cheese, cream, or butter sauce. Fried vegetables. Grains Breads that are made with saturated or trans fats, oils, or whole milk. Croissants. Sweet rolls. Donuts. High-fat crackers, such as cheese crackers. Meats and other proteins Fatty meats, such as hot dogs, ribs, sausage, bacon, rib-eye roast or steak. High-fat deli meats, such as salami and bologna. Caviar. Domestic duck and goose. Organ meats, such as liver. Dairy Cream, sour cream, cream cheese, and creamed cottage cheese. Whole-milk cheeses. Whole or 2% milk that is liquid, evaporated, or condensed. Whole buttermilk. Cream sauce or high-fat cheese sauce. Yogurt that is made from whole milk. Fats and oils Meat fat, or shortening. Cocoa butter, hydrogenated oils, palm oil, coconut oil, palm kernel oil. Solid fats and shortenings, including bacon fat, salt  pork, lard, and butter. Nondairy cream substitutes. Salad dressings with cheese or sour cream. Beverages Regular sodas and juice drinks with added sugar. Sweets and desserts Frosting. Pudding. Cookies. Cakes. Pies. Milk chocolate or white chocolate. Buttered syrups. Full-fat ice cream or ice cream drinks. The items listed above may not be a complete list of foods and drinks to avoid. Contact a dietitian for more information. Summary Heart-healthy meal planning includes eating less unhealthy fats, eating more healthy fats, and making other changes in your diet. Eat a balanced diet. This  includes fruits and vegetables, low-fat or nonfat dairy, lean protein, nuts and legumes, whole grains, and heart-healthy oils and fats. This information is not intended to replace advice given to you by your health care provider. Make sure you discuss any questions you have with your health care provider. Document Revised: 04/16/2021 Document Reviewed: 04/16/2021 Elsevier Patient Education  2024 [employer] Low-Sodium Eating Plan Salt (sodium) helps you keep a healthy balance of fluids in your body. Too much sodium can raise your blood pressure. It can also cause fluid and waste to be held in your body. Your health care provider or dietitian may recommend a low-sodium eating plan if you have high blood pressure (hypertension), kidney disease, liver disease, or heart failure. Eating less sodium can help lower your blood pressure and reduce swelling. It can also protect your heart, liver, and kidneys. What are tips for following this plan? Reading food labels  Check food labels for the amount of sodium per serving. If you eat more than one serving, you must multiply the listed amount by the number of servings. Choose foods with less than 140 milligrams (mg) of sodium per serving. Avoid foods with 300 mg of sodium or more per serving. Always check how much sodium is in a product, even if the label says "unsalted" or "no salt added." Shopping  Buy products labeled as "low-sodium" or "no salt added." Buy fresh foods. Avoid canned foods and pre-made or frozen meals. Avoid canned, cured, or processed meats. Buy breads that have less than 80 mg of sodium per slice. Cooking  Eat more home-cooked food. Try to eat less restaurant, buffet, and fast food. Try not to add salt when you cook. Use salt-free seasonings or herbs instead of table salt or sea salt. Check with your provider or pharmacist before using salt substitutes. Cook with plant-based oils, such as canola, sunflower, or olive oil. Meal  planning When eating at a restaurant, ask if your food can be made with less salt or no salt. Avoid dishes labeled as brined, pickled, cured, or smoked. Avoid dishes made with soy sauce, miso, or teriyaki sauce. Avoid foods that have monosodium glutamate (MSG) in them. MSG may be added to some restaurant food, sauces, soups, bouillon, and canned foods. Make meals that can be grilled, baked, poached, roasted, or steamed. These are often made with less sodium. General information Try to limit your sodium intake to 1,500-2,300 mg each day, or the amount told by your provider. What foods should I eat? Fruits Fresh, frozen, or canned fruit. Fruit juice. Vegetables Fresh or frozen vegetables. "No salt added" canned vegetables. "No salt added" tomato sauce and paste. Low-sodium or reduced-sodium tomato and vegetable juice. Grains Low-sodium cereals, such as oats, puffed wheat and rice, and shredded wheat. Low-sodium crackers. Unsalted rice. Unsalted pasta. Low-sodium bread. Whole grain breads and whole grain pasta. Meats and other proteins Fresh or frozen meat, poultry, seafood, and fish. These should have no added salt.  Low-sodium canned tuna and salmon. Unsalted nuts. Dried peas, beans, and lentils without added salt. Unsalted canned beans. Eggs. Unsalted nut butters. Dairy Milk. Soy milk. Cheese that is naturally low in sodium, such as ricotta cheese, fresh mozzarella, or Swiss cheese. Low-sodium or reduced-sodium cheese. Cream cheese. Yogurt. Seasonings and condiments Fresh and dried herbs and spices. Salt-free seasonings. Low-sodium mustard and ketchup. Sodium-free salad dressing. Sodium-free light mayonnaise. Fresh or refrigerated horseradish. Lemon juice. Vinegar. Other foods Homemade, reduced-sodium, or low-sodium soups. Unsalted popcorn and pretzels. Low-salt or salt-free chips. The items listed above may not be all the foods and drinks you can have. Talk to a dietitian to learn more. What  foods should I avoid? Vegetables Sauerkraut, pickled vegetables, and relishes. Olives. [place] fries. Onion rings. Regular canned vegetables, except low-sodium or reduced-sodium items. Regular canned tomato sauce and paste. Regular tomato and vegetable juice. Frozen vegetables in sauces. Grains Instant hot cereals. Bread stuffing, pancake, and biscuit mixes. Croutons. Seasoned rice or pasta mixes. Noodle soup cups. Boxed or frozen macaroni and cheese. Regular salted crackers. Self-rising flour. Meats and other proteins Meat or fish that is salted, canned, smoked, spiced, or pickled. Precooked or cured meat, such as sausages or meat loaves. Tomasa Blase. Ham. Pepperoni. Hot dogs. Corned beef. Chipped beef. Salt pork. Jerky. Pickled herring, anchovies, and sardines. Regular canned tuna. Salted nuts. Dairy Processed cheese and cheese spreads. Hard cheeses. Cheese curds. Blue cheese. Feta cheese. String cheese. Regular cottage cheese. Buttermilk. Canned milk. Fats and oils Salted butter. Regular margarine. Ghee. Bacon fat. Seasonings and condiments Onion salt, garlic salt, seasoned salt, table salt, and sea salt. Canned and packaged gravies. Worcestershire sauce. Tartar sauce. Barbecue sauce. Teriyaki sauce. Soy sauce, including reduced-sodium soy sauce. Steak sauce. Fish sauce. Oyster sauce. Cocktail sauce. Horseradish that you find on the shelf. Regular ketchup and mustard. Meat flavorings and tenderizers. Bouillon cubes. Hot sauce. Pre-made or packaged marinades. Pre-made or packaged taco seasonings. Relishes. Regular salad dressings. Salsa. Other foods Salted popcorn and pretzels. Corn chips and puffs. Potato and tortilla chips. Canned or dried soups. Pizza. Frozen entrees and pot pies. The items listed above may not be all the foods and drinks you should avoid. Talk to a dietitian to learn more. This information is not intended to replace advice given to you by your health care provider. Make sure you  discuss any questions you have with your health care provider. Document Revised: 03/28/2022 Document Reviewed: 03/28/2022 Elsevier Patient Education  2024 [employer].

## 2022-08-29 IMAGING — CT CT CHEST LUNG CANCER SCREENING LOW DOSE W/O CM
1 series · 15 of 31 positions shown, 19 images · non-contrast
Comparison: CT chest dated September 08, 2020

CLINICAL DATA: Current smoker with 87 pack-year history



[Series 2: ldct screening <30 bmi · axial · 0.80mm/px · z∈[+660,+1000]mm · 15 of 74 slices shown, 19 images]
[im 3/74  mediastinal]
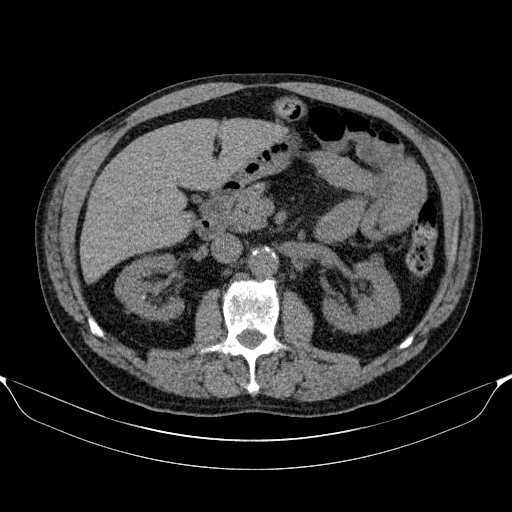
[im 3/74  lung]
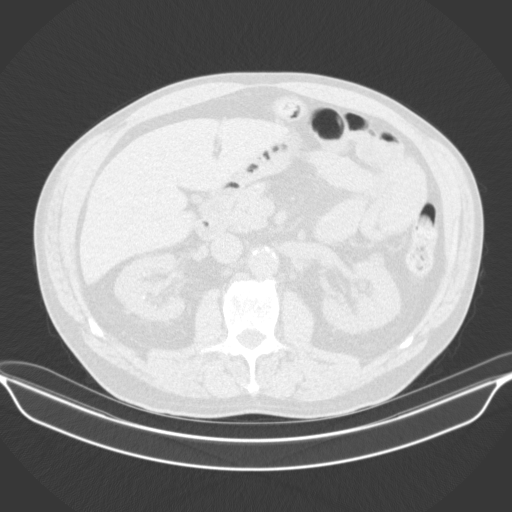
[im 9/74  lung]
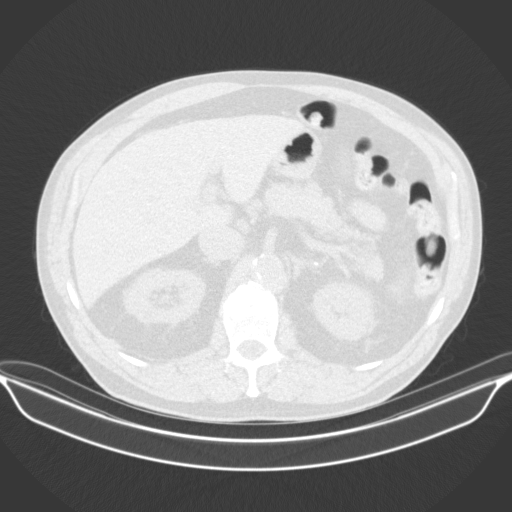
[im 14/74  lung]
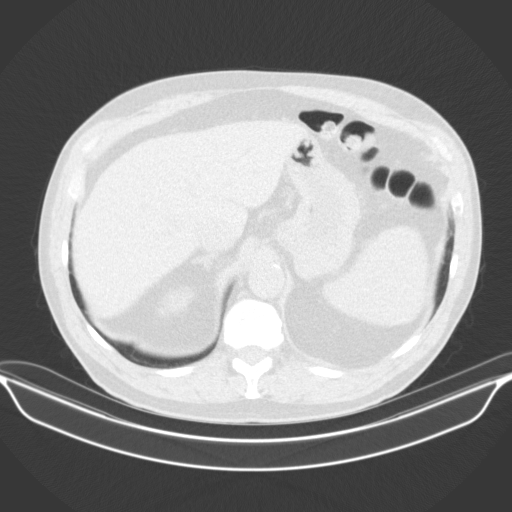
[im 17/74  lung]
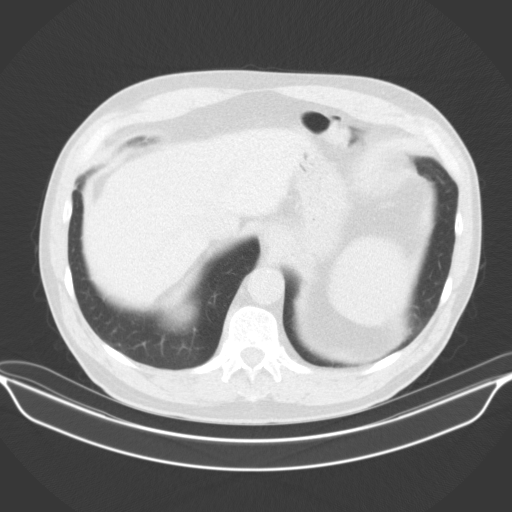
[im 22/74  mediastinal]
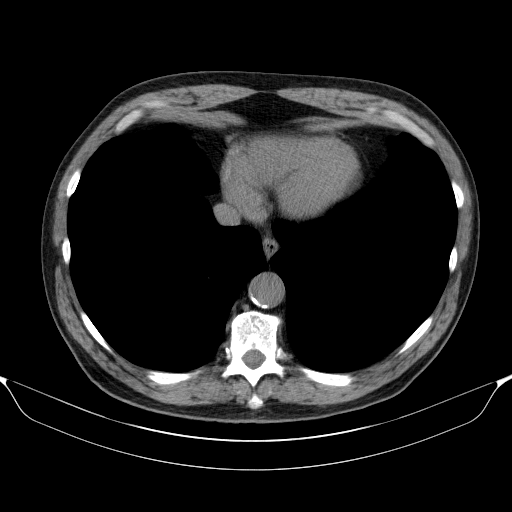
[im 22/74  lung]
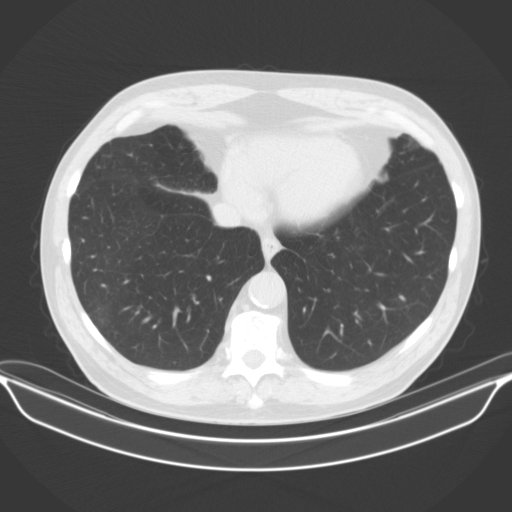
[im 28/74  lung]
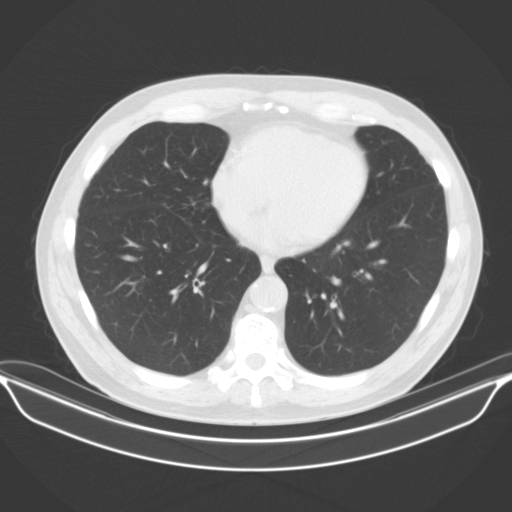
[im 33/74  lung]
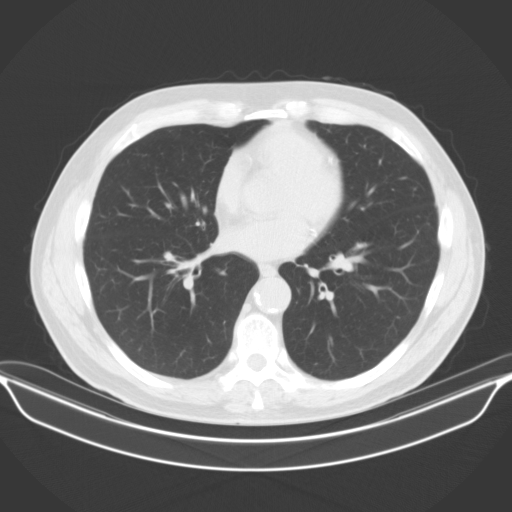
[im 38/74  lung]
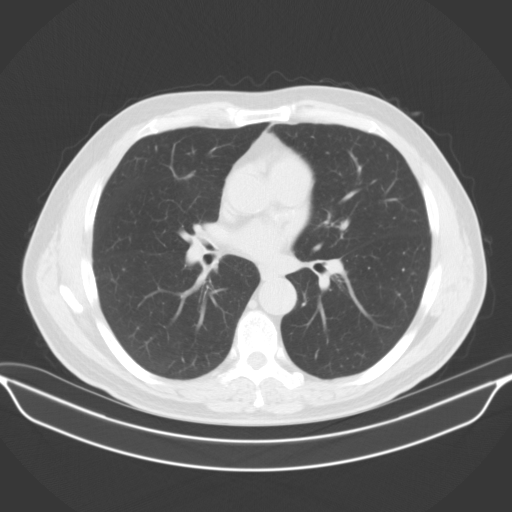
[im 41/74  mediastinal]
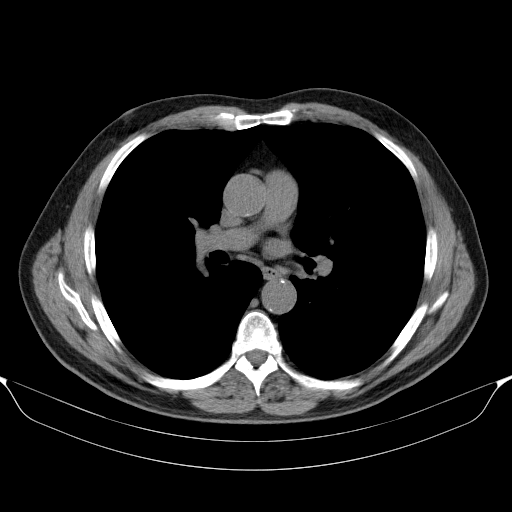
[im 41/74  lung]
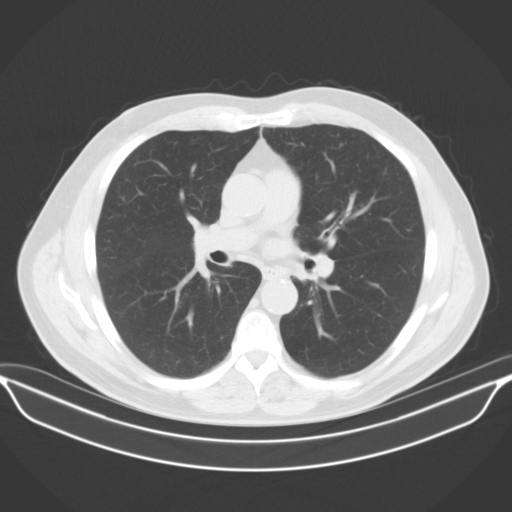
[im 46/74  lung]
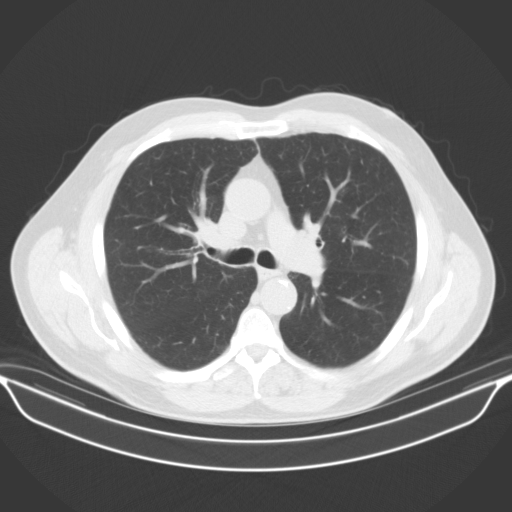
[im 52/74  lung]
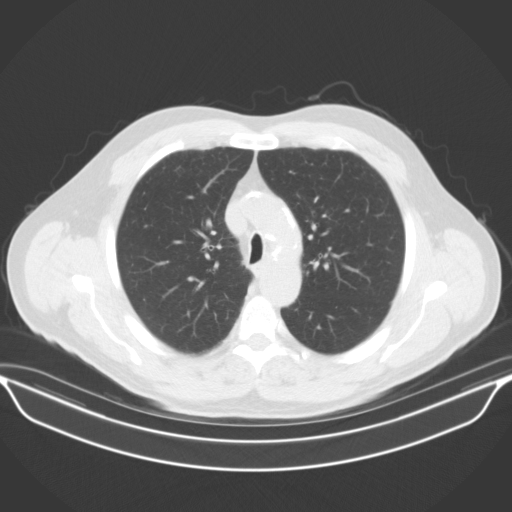
[im 57/74  lung]
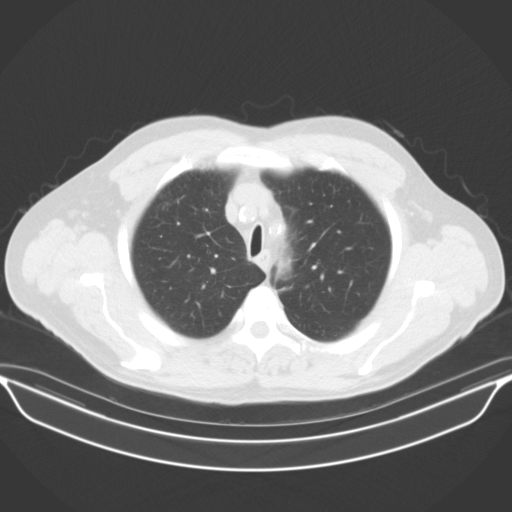
[im 60/74  mediastinal]
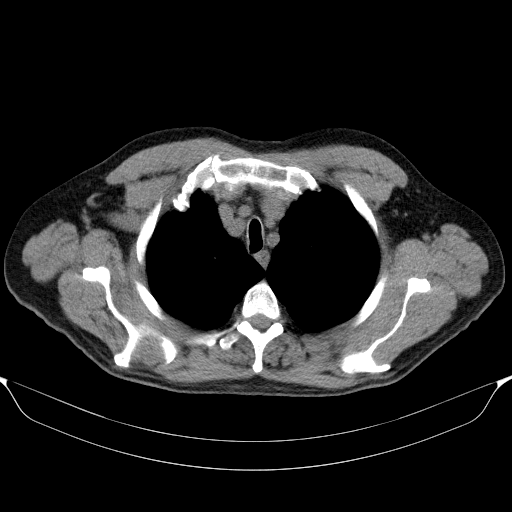
[im 60/74  lung]
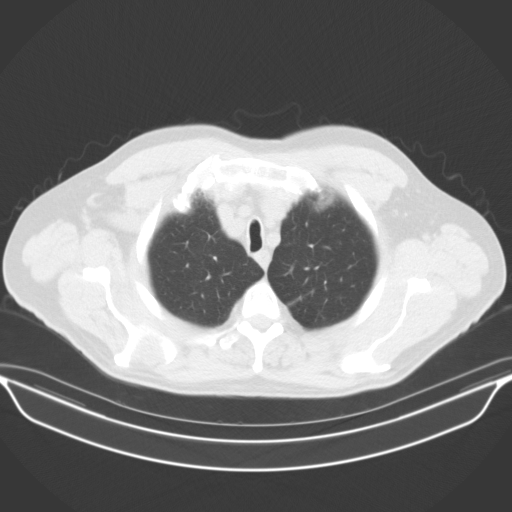
[im 65/74  lung]
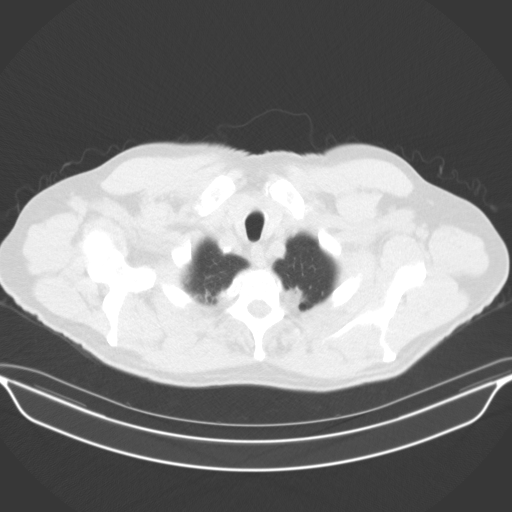
[im 71/74  lung]
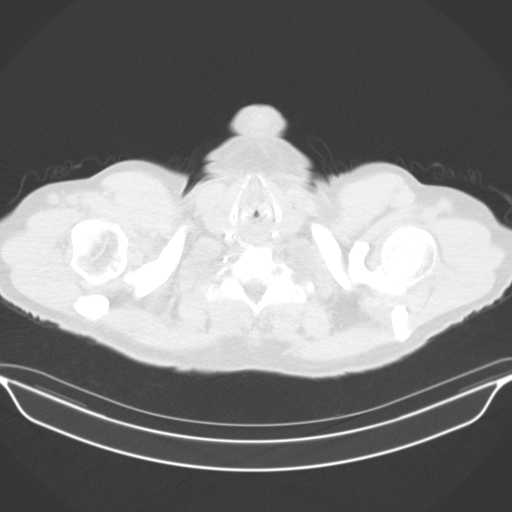

[15 of 31 positions shown; findings below may reference images not displayed]

FINDINGS: Cardiovascular: Normal heart size. No pericardial effusion. Severe
three-vessel coronary artery calcifications. Atherosclerotic disease
of the thoracic aorta.

Mediastinum/Nodes: Esophagus and thyroid are unremarkable. No
pathologically enlarged lymph nodes seen in the chest.

Lungs/Pleura: Central airways are patent. Mild paraseptal emphysema.
No consolidation, pleural effusion or pneumothorax. Stable nodular
opacity at the left lung apex on series 59, likely due to
pleural-parenchymal scarring. Stable small solid pulmonary nodule of
the right lower lobe measuring 2.3 mm in mean diameter on image 183.

Upper Abdomen: Nonobstructing right renal stones. No acute
abnormality.

Musculoskeletal: No chest wall mass or suspicious bone lesions
identified.
IMPRESSION: 1. Lung-RADS 2, benign appearance or behavior. Continue annual
screening with low-dose chest CT without contrast in 12 months.
2. Coronary artery calcifications, aortic Atherosclerosis
(14HUP-XL1.1) and Emphysema (14HUP-A1G.S).

## 2022-09-16 DIAGNOSIS — L570 Actinic keratosis: Secondary | ICD-10-CM | POA: Diagnosis not present

## 2022-09-16 DIAGNOSIS — D045 Carcinoma in situ of skin of trunk: Secondary | ICD-10-CM | POA: Diagnosis not present

## 2022-09-16 DIAGNOSIS — D225 Melanocytic nevi of trunk: Secondary | ICD-10-CM | POA: Diagnosis not present

## 2022-09-16 DIAGNOSIS — Z85828 Personal history of other malignant neoplasm of skin: Secondary | ICD-10-CM | POA: Diagnosis not present

## 2022-09-16 DIAGNOSIS — L814 Other melanin hyperpigmentation: Secondary | ICD-10-CM | POA: Diagnosis not present

## 2022-09-16 DIAGNOSIS — L821 Other seborrheic keratosis: Secondary | ICD-10-CM | POA: Diagnosis not present

## 2022-09-18 ENCOUNTER — Ambulatory Visit
Admission: RE | Admit: 2022-09-18 | Discharge: 2022-09-18 | Disposition: A | Discharge status: Home / Self Care | Payer: Medicare Other | Source: Ambulatory Visit | Attending: Acute Care | Admitting: Acute Care

## 2022-09-18 DIAGNOSIS — F17210 Nicotine dependence, cigarettes, uncomplicated: Secondary | ICD-10-CM

## 2022-09-18 DIAGNOSIS — Z87891 Personal history of nicotine dependence: Secondary | ICD-10-CM

## 2022-09-18 DIAGNOSIS — Z122 Encounter for screening for malignant neoplasm of respiratory organs: Secondary | ICD-10-CM

## 2022-10-02 ENCOUNTER — Ambulatory Visit: Payer: Medicare Other | Admitting: Podiatry

## 2022-10-02 ENCOUNTER — Ambulatory Visit (INDEPENDENT_AMBULATORY_CARE_PROVIDER_SITE_OTHER): Payer: Medicare Other

## 2022-10-02 DIAGNOSIS — M7751 Other enthesopathy of right foot: Secondary | ICD-10-CM | POA: Diagnosis not present

## 2022-10-02 NOTE — Progress Notes (Signed)
Subjective:   Patient ID: Julian Weaver, male   DOB: 77 y.o.   MRN: [id number]   HPI Patient presents stating this callus is actually killing him and he is looking for any kind of relief he can get as the medicine only work for approximately 1 month   ROS      Objective:  Physical Exam  Neurovascular status intact with severe pain underneath the tibial sesamoid right and underneath the metatarsal with history of first metatarsal bunion correction with a elevated hallux placing pressure on the metatarsal with 2 screw fixation     Assessment:  Chronic pressure against the tibial sesamoid with also structural abnormality of the big toe creating abnormal pressure     Plan:  H&P reviewed.  Ultimately may require fusion but he would like to avoid that if possible and at this point I went ahead and I did discuss surgery for this and I do think we can attempt to remove the tibial sesamoid along with debridement technique and removal of the screws in case ultimately he requires fusion of the first MPJ.  He absolutely understands there is no guarantee that this will solve the problem he is willing to accept this risk wants surgery signed consent form and is scheduled for outpatient surgery with all questions answered today for tibial sesamoidectomy and removal of screws.  Ultimately does understand fusion may be necessary

## 2022-10-16 ENCOUNTER — Encounter: Payer: Self-pay | Admitting: Podiatry

## 2022-10-16 ENCOUNTER — Ambulatory Visit (INDEPENDENT_AMBULATORY_CARE_PROVIDER_SITE_OTHER): Payer: Medicare Other | Admitting: Podiatry

## 2022-10-16 DIAGNOSIS — L03115 Cellulitis of right lower limb: Secondary | ICD-10-CM | POA: Diagnosis not present

## 2022-10-16 DIAGNOSIS — L97511 Non-pressure chronic ulcer of other part of right foot limited to breakdown of skin: Secondary | ICD-10-CM

## 2022-10-16 MED ORDER — DOXYCYCLINE HYCLATE 100 MG PO TABS
100.0000 mg | ORAL_TABLET | Freq: Two times a day (BID) | ORAL | 0 refills | Status: AC
Start: 2022-10-16 — End: 2022-10-23

## 2022-10-16 MED ORDER — MUPIROCIN 2 % EX OINT: 1.0000 | TOPICAL_OINTMENT | Freq: Two times a day (BID) | CUTANEOUS | 2 refills | Status: DC

## 2022-10-16 NOTE — Progress Notes (Signed)
  Subjective:  Patient ID: Julian Weaver, male    DOB: [birth year],   MRN: [id number]  Chief Complaint  Patient presents with   Foot Pain    Pt came in for pain in the right foot. Pt stated that his right foot is swollen sometimes that he cn barely walk. Someday's the pain is a 45     77 y.o. male presents for concern of right foot pain swelling and redness that started several days ago. Has been following with Dr. Charlsie Merles and plans for surgery in another few weeks to remove hardware and sesamoid. Relates the pain has since worsened and wanted it checked out before surgery.  . Denies any other pedal complaints. Denies n/v/f/c.   Past Medical History:  Diagnosis Date   Allergic rhinitis    Aortic atherosclerosis (HCC)    Arthritis    Carotid artery stenosis    1-39% bilateral dopplers 05/2020   Coronary artery disease 04/13/2008   Drug eluting stent in RCA // Myoview 04/2019: EF 52, normal perfusion; low risk   Hypercholesteremia    Hypertension    RBBB    Renal disorder    Tobacco abuse     Objective:  Physical Exam: Vascular: DP/PT pulses 2/4 bilateral. CFT <3 seconds. Normal hair growth on digits. No edema.  Skin. No lacerations or abrasions bilateral feet. Hyperkeratotic lesion noted sub first metatarsal head on the right. Upon debridement open ulceration noted about 0.5 cm in circumference and about 0.1 cm deep with granular base. Erythema and edema surrounding wound and tracking to dorsum of first MPJ. Scant amount of purulence noted.  Musculoskeletal: MMT 5/5 bilateral lower extremities in DF, PF, Inversion and Eversion. Deceased ROM in DF of ankle joint.  Neurological: Sensation intact to light touch.   Assessment:   1. Ulcer of right foot limited to breakdown of skin (HCC)   2. Cellulitis of right foot      Plan:  Patient was evaluated and treated and all questions answered. Ulcer plantar right first metatarsal head limited to breakdown of skin  -Debridement as  below. -Dressed with betadine , DSD. -Mupirocin sent to pharmacy to be applied daily.  -Off-loading with surgical shoe. Dispensed.  -Doxycycline sent to pharmacy for cellulitis.  -Discussed glucose control and proper protein-rich diet.  -Discussed if any worsening redness, pain, fever or chills to call or may need to report to the emergency room. Patient expressed understanding.   Procedure: Excisional Debridement of Wound Rationale: Removal of non-viable soft tissue from the wound to promote healing.  Anesthesia: none Pre-Debridement Wound Measurements: Overlying callus  Post-Debridement Wound Measurements: 0.5 cm x 0.5 cm x 0.1 cm  Type of Debridement: Sharp Excisional Tissue Removed: Non-viable soft tissue Depth of Debridement: subcutaneous tissue. Technique: Sharp excisional debridement to bleeding, viable wound base.  Dressing: Dry, sterile, compression dressing. Disposition: Patient tolerated procedure well. Patient to return in 2 week for follow-up.  Return in about 2 weeks (around 10/30/2022) for wound check.   Louann Sjogren, DPM

## 2022-10-26 ENCOUNTER — Other Ambulatory Visit: Payer: Self-pay | Admitting: Cardiology

## 2022-10-28 ENCOUNTER — Other Ambulatory Visit (HOSPITAL_COMMUNITY): Payer: Self-pay | Admitting: *Deleted

## 2022-10-28 MED ORDER — MULTAQ 400 MG PO TABS: 400.0000 mg | ORAL_TABLET | Freq: Two times a day (BID) | ORAL | 6 refills | Status: DC

## 2022-10-29 ENCOUNTER — Ambulatory Visit (INDEPENDENT_AMBULATORY_CARE_PROVIDER_SITE_OTHER): Payer: Medicare Other | Admitting: Podiatry

## 2022-10-29 ENCOUNTER — Encounter: Payer: Self-pay | Admitting: Podiatry

## 2022-10-29 DIAGNOSIS — L84 Corns and callosities: Secondary | ICD-10-CM | POA: Diagnosis not present

## 2022-10-29 DIAGNOSIS — M7751 Other enthesopathy of right foot: Secondary | ICD-10-CM | POA: Diagnosis not present

## 2022-10-29 NOTE — Progress Notes (Signed)
  Subjective:  Patient ID: Julian Weaver, male    DOB: 08/25/1945,   MRN: [id number]  Chief Complaint  Patient presents with   Foot Ulcer    R foot 2 wks    77 y.o. male presents for follow-up of right foot ulceration. Relates doing much better and surgery still planned for a couple weeks. Finished antibiotics  . Denies any other pedal complaints. Denies n/v/f/c.   Past Medical History:  Diagnosis Date   Allergic rhinitis    Aortic atherosclerosis (HCC)    Arthritis    Carotid artery stenosis    1-39% bilateral dopplers 05/2020   Coronary artery disease 04/13/2008   Drug eluting stent in RCA // Myoview 04/2019: EF 52, normal perfusion; low risk   Hypercholesteremia    Hypertension    RBBB    Renal disorder    Tobacco abuse     Objective:  Physical Exam: Vascular: DP/PT pulses 2/4 bilateral. CFT <3 seconds. Normal hair growth on digits. No edema.  Skin. No lacerations or abrasions bilateral feet. Hyperkeratotic lesion noted sub first metatarsal head on the right. Upon debridement ulceration healed. No erythema edema or purulence noted.  Musculoskeletal: MMT 5/5 bilateral lower extremities in DF, PF, Inversion and Eversion. Deceased ROM in DF of ankle joint.  Neurological: Sensation intact to light touch.   Assessment:   1. Capsulitis of metatarsophalangeal (MTP) joint of right foot   2. Pre-ulcerative calluses       Plan:  Patient was evaluated and treated and all questions answered. Ulcer plantar right first metatarsal head- healed  -Debridement of hyperkeratotic tissue as courtesy.  -Advised to keep bandaid on it until surgery to prevent any reopening. .  -Discussed if any worsening redness, pain, fever or chills to call or may need to report to the emergency room. Patient expressed understanding.  Return as planned for surgery with Dr. Charlsie Merles.     No follow-ups on file.   Louann Sjogren, DPM

## 2022-11-04 ENCOUNTER — Other Ambulatory Visit (HOSPITAL_COMMUNITY): Payer: Self-pay

## 2022-11-04 MED ORDER — MULTAQ 400 MG PO TABS: 400.0000 mg | ORAL_TABLET | Freq: Two times a day (BID) | ORAL | 6 refills | Status: DC

## 2022-11-06 ENCOUNTER — Ambulatory Visit (HOSPITAL_COMMUNITY)
Admission: RE | Admit: 2022-11-06 | Discharge: 2022-11-06 | Disposition: A | Discharge status: Home / Self Care | Payer: Medicare Other | Source: Ambulatory Visit | Attending: Physician Assistant | Admitting: Physician Assistant

## 2022-11-06 ENCOUNTER — Encounter (HOSPITAL_COMMUNITY): Payer: Self-pay | Admitting: Physician Assistant

## 2022-11-06 VITALS — BP 124/60 | HR 60 | Ht 68.0 in | Wt 176.2 lb

## 2022-11-06 DIAGNOSIS — I10 Essential (primary) hypertension: Secondary | ICD-10-CM | POA: Insufficient documentation

## 2022-11-06 DIAGNOSIS — E785 Hyperlipidemia, unspecified: Secondary | ICD-10-CM | POA: Diagnosis not present

## 2022-11-06 DIAGNOSIS — I480 Paroxysmal atrial fibrillation: Secondary | ICD-10-CM | POA: Insufficient documentation

## 2022-11-06 DIAGNOSIS — D6869 Other thrombophilia: Secondary | ICD-10-CM | POA: Diagnosis not present

## 2022-11-06 DIAGNOSIS — Z5181 Encounter for therapeutic drug level monitoring: Secondary | ICD-10-CM | POA: Diagnosis not present

## 2022-11-06 DIAGNOSIS — Z79899 Other long term (current) drug therapy: Secondary | ICD-10-CM | POA: Insufficient documentation

## 2022-11-06 DIAGNOSIS — I2510 Atherosclerotic heart disease of native coronary artery without angina pectoris: Secondary | ICD-10-CM | POA: Diagnosis not present

## 2022-11-06 DIAGNOSIS — Z7901 Long term (current) use of anticoagulants: Secondary | ICD-10-CM | POA: Insufficient documentation

## 2022-11-06 DIAGNOSIS — I4892 Unspecified atrial flutter: Secondary | ICD-10-CM | POA: Insufficient documentation

## 2022-11-06 LAB — CBC
HCT: 36.7 % — ABNORMAL LOW (ref 39.0–52.0)
Hemoglobin: 11.5 g/dL — ABNORMAL LOW (ref 13.0–17.0)
MCH: 26.4 pg (ref 26.0–34.0)
MCHC: 31.3 g/dL (ref 30.0–36.0)
MCV: 84.2 fL (ref 80.0–100.0)
Platelets: 246 10*3/uL (ref 150–400)
RBC: 4.36 MIL/uL (ref 4.22–5.81)
RDW: 20.1 % — ABNORMAL HIGH (ref 11.5–15.5)
WBC: 7.3 10*3/uL (ref 4.0–10.5)
nRBC: 0 % (ref 0.0–0.2)

## 2022-11-06 NOTE — Progress Notes (Signed)
Primary Care Physician: Shon Hale, MD Referring Physician: Dr. Storm Frisk Julian Weaver is a 77 y.o. male with a h/o  CAD, HTN, Carotid disease, HLD who presents to the [facility] for follow up. Patient is on Eliquis for a CHADS2VASC score of 4. He has been maintained on Multaq for rhythm control.  On follow up today, patient reports that he has done reasonably well since his last visit. He has had a few brief episodes of afib. He takes an extra 1/2 dose of BB which helps to resolve his symptoms. No bleeding issues on anticoagulation.   Today, he denies symptoms of palpitations, chest pain, shortness of breath, orthopnea, PND, lower extremity edema, dizziness, presyncope, syncope, or neurologic sequela. The patient is tolerating medications without difficulties and is otherwise without complaint today.   Past Medical History:  Diagnosis Date   Allergic rhinitis    Aortic atherosclerosis (HCC)    Arthritis    Carotid artery stenosis    1-39% bilateral dopplers 05/2020   Coronary artery disease 04/13/2008   Drug eluting stent in RCA // Myoview 04/2019: EF 52, normal perfusion; low risk   Hypercholesteremia    Hypertension    RBBB    Renal disorder    Tobacco abuse    Current Outpatient Medications  Medication Sig Dispense Refill   albuterol (VENTOLIN HFA) 108 (90 Base) MCG/ACT inhaler SMARTSIG:2 Puff(s) By Mouth Every 4 Hours PRN     amLODipine (NORVASC) 5 MG tablet Take 1 tablet (5 mg total) by mouth daily. 90 tablet 3   apixaban (ELIQUIS) 5 MG TABS tablet Take 1 tablet (5 mg total) by mouth 2 (two) times daily. 180 tablet 3   aspirin EC 81 MG tablet Take 81 mg by mouth daily. Swallow whole.     atorvastatin (LIPITOR) 20 MG tablet TAKE 1 TABLET(20 MG) BY MOUTH DAILY 90 tablet 2   Coenzyme Q10 (CO Q-10) 100 MG CAPS Take 1 capsule by mouth every morning.     cyanocobalamin (VITAMIN B12) 1000 MCG tablet Take 1,000 mcg by mouth daily.      dronedarone (MULTAQ) 400 MG tablet Take 1 tablet (400 mg total) by mouth 2 (two) times daily with a meal. 60 tablet 6   EPINEPHrine (EPIPEN IJ) Inject as directed as needed (bee stings).      Fluticasone-Umeclidin-Vilant 100-62.5-25 MCG/ACT AEPB Inhale 1 puff into the lungs at bedtime. Not sure the dosage     HYDROcodone-acetaminophen (NORCO/VICODIN) 5-325 MG tablet Take 1-2 tablets by mouth every 6 (six) hours as needed. 24 tablet 0   metoprolol tartrate (LOPRESSOR) 25 MG tablet Take 0.5 tablets (12.5 mg total) by mouth 2 (two) times daily. 180 tablet 3   mupirocin ointment (BACTROBAN) 2 % Apply 1 Application topically 2 (two) times daily. 30 g 2   nitroGLYCERIN (NITROSTAT) 0.4 MG SL tablet Place 1 tablet (0.4 mg total) under the tongue every 5 (five) minutes as needed for chest pain. 25 tablet 3   omeprazole (PRILOSEC) 20 MG capsule Take 20 mg by mouth as needed.     Current Facility-Administered Medications  Medication Dose Route Frequency Provider Last Rate Last Admin   triamcinolone acetonide (KENALOG) 10 MG/ML injection 10 mg  10 mg Other Once Asencion Islam, DPM        ROS- All systems are reviewed and negative except as per the HPI above  Physical Exam: Vitals:   11/06/22 0903  BP: 124/60  Pulse: 60  Weight:  79.9 kg  Height: 5\' 8"  (1.727 m)    Wt Readings from Last 3 Encounters:  11/06/22 79.9 kg  08/21/22 79.8 kg  06/25/22 81.6 kg    GEN: Well nourished, well developed in no acute distress NECK: No JVD; No carotid bruits CARDIAC: Regular rate and rhythm, no murmurs, rubs, gallops RESPIRATORY:  Clear to auscultation without rales, wheezing or rhonchi  ABDOMEN: Soft, non-tender, non-distended EXTREMITIES:  No edema; No deformity    EKG today demonstrates SR, RBBB Vent. rate 60 BPM PR interval 138 ms QRS duration 134 ms QT/QTcB 460/460 ms   Echo 10/31/20 Left Ventricle: Left ventricular ejection fraction, by visual estimation,  is 55 to 60%. The left ventricle  has normal function. The average left  ventricular global longitudinal strain is -20.2 %. The left ventricle has  no regional wall motion abnormalities. There is no left ventricular hypertrophy. Left ventricular diastolic parameters are consistent with Grade I diastolic dysfunction (impaired relaxation). Indeterminate filling pressures.  Left Atrium: Left atrial size was normal in size   CHA2DS2-VASc Score = 4  The patient's score is based upon: CHF History: 0 HTN History: 1 Diabetes History: 0 Stroke History: 0 Vascular Disease History: 1 Age Score: 2 Gender Score: 0       ASSESSMENT AND PLAN: Paroxysmal Atrial Fibrillation/atrial flutter The patient's CHA2DS2-VASc score is 4, indicating a 4.8% annual risk of stroke.   Patient having infrequent episodes. We discussed alternate AAD vs ablation, he is happy with his present treatment.  Continue Multaq 400 mg BID Continue Lopressor 12.5 mg BID with extra 12.5 mg PRN for heart racing Continue Eliquis 5 mg BID Check cbc today  Secondary Hypercoagulable State (ICD10:  D68.69) The patient is at significant risk for stroke/thromboembolism based upon his CHA2DS2-VASc Score of 4.  Continue Apixaban (Eliquis).   CAD No anginal symptoms  HTN Stable on current regimen   Follow up in the AF clinic in 6 months.    Jorja Loa PA-C [facility] [address] [place], [place] [address] [phone]

## 2022-11-14 ENCOUNTER — Telehealth: Payer: Self-pay | Admitting: Acute Care

## 2022-11-14 DIAGNOSIS — Z87891 Personal history of nicotine dependence: Secondary | ICD-10-CM

## 2022-11-14 DIAGNOSIS — R911 Solitary pulmonary nodule: Secondary | ICD-10-CM

## 2022-11-14 NOTE — Telephone Encounter (Signed)
Spoke with pt and reviewed CT results. Small nodule seen that we would like to look at again in 6 months to make sure no growth of nodule. Pt is in agreement and is aware we will call him closer to 6 months to repeat his scan. CT results/ plans faxed to PCP. Order placed for 6 month nodule f/u CT.

## 2022-11-14 NOTE — Telephone Encounter (Signed)
I have reviewed the lung RADS 2 screening scan done in June.  Patient has  a 12.5 mm nodule in the left upper lobe.  This nodule has waxed and waned in size. In 2022 it was 11.5 mm, in 2023 it was 11.3 mm it is now 12.5 mm. As it has shown some growth and based on appearance, we will do a 6 month follow up.  When calling the patient just let him know we do an over read on all large nodules to ensure we agree with the radiology read. In this case we want to do a 6 month follow up based on size and appearance. If it is stable on 6 month follow up we will resume annual screening . Please fax results to PCP and let them know plan. Please place order for 6 month follow up due 02/2023. Thanks so much

## 2022-11-15 ENCOUNTER — Encounter: Payer: Self-pay | Admitting: Podiatry

## 2022-11-18 ENCOUNTER — Telehealth: Payer: Self-pay | Admitting: *Deleted

## 2022-11-18 MED ORDER — HYDROCODONE-ACETAMINOPHEN 10-325 MG PO TABS: 1.0000 | ORAL_TABLET | Freq: Three times a day (TID) | ORAL | 0 refills | Status: AC | PRN

## 2022-11-18 NOTE — Telephone Encounter (Signed)
   Name: Halley Elza  DOB: [birth year]  MRN: [id number]  Primary Cardiologist: Armanda Magic, MD  Chart reviewed as part of pre-operative protocol coverage. Because of Marcelo Mcconnel Krog's past medical history and time since last visit, he will require a follow-up in-office visit in order to better assess preoperative cardiovascular risk.  Patient is already scheduled with Dr. Mayford Knife on 9/6. Appointment notes were updated to reflect need for pre-op evaluation.   Pre-op covering staff:  - Please contact requesting surgeon's office via preferred method (i.e, phone, fax) to inform them of need for appointment prior to surgery.  This message will also be routed to pharmacy pool for input on holding Eliquis as requested below so that this information is available to the clearing provider at time of patient's appointment.   Carlos Levering, NP  11/18/2022, 3:46 PM

## 2022-11-18 NOTE — Telephone Encounter (Signed)
Will update all parties involved.

## 2022-11-18 NOTE — Telephone Encounter (Signed)
   Pre-operative Risk Assessment    Patient Name: Julian Weaver  DOB: 03/04/1946 MRN: [id number]   DATE OF LAST VISIT:  GEN CARD 08/21/22 Julian Weaver, PAC DATE OF NEXT VISIT: 11/29/22 Julian Weaver  Request for Surgical Clearance    Procedure:   removal of a tibial sesamoid  Date of Surgery:  Clearance TBD                             +    Surgeon:    Julian Weaver, DPM Surgeon's Group or Practice Name:  [facility] Phone number:  [phone] Fax number:  [phone]   Type of Clearance Requested:   - Medical  - Pharmacy:  Hold Aspirin and Apixaban (Eliquis)     Type of Anesthesia:  General    Additional requests/questions:    Julian Weaver   11/18/2022, 2:35 PM   Message Received: Today Wittenborn, Julian Pound, NP  P Cv Div Preop Callback Pre-op team,  Will you please convert this request to a telephone encounter.  Thank you!  DW       Previous Messages  Letter  Julian Weaver, DPM on 11/15/2022     [facility] at [facility] [address], [address] [place], [place]  [address] Phone:  [phone]   Fax:  [phone]   November 15, 2022    Patient: Julian Weaver  Date of Birth: [birth year]  Date of Visit: 11/15/2022    Dear Dr. Mayford Knife,     Julian Weaver, DOB Mar 14, 1946, is a mutual patient.  I am treating him for pre ulcerative calluses. He needs surgery which would be performed at an outpatient ambulatory surgical center. He would receive general anesthetic. The procedures will be a removal of a tibial sesamoid. I would like cardiac clearance.  Are there any contra-indications?  Does the patient need to stop taking any medications prior to her his surgery?  If so, please give the instructions.  You can respond via Epic in-basket or fax to [phone].    Sincerely,        Julian Weaver, DPM  [place] [facility] at [facility], [phone]

## 2022-11-18 NOTE — Addendum Note (Signed)
Addended by: Lenn Sink on: 11/18/2022 03:13 PM   Modules accepted: Orders

## 2022-11-18 NOTE — Telephone Encounter (Signed)
-----   Message from Julian Weaver sent at 11/18/2022 10:52 AM EDT ----- Pre-op team,   Will you please convert this request to a telephone encounter.   Thank you!  DW ----- Message ----- From: Quintella Reichert, MD Sent: 11/18/2022   9:45 AM EDT To: Mickie Bail Preop  FYI ----- Message ----- From: Karyl Kinnier Sent: 11/15/2022  11:40 AM EDT To: Quintella Reichert, MD

## 2022-11-18 NOTE — Telephone Encounter (Signed)
Please advise holding Eliquis prior to Removal  of tibial sesamoid.   Thank you!  DW

## 2022-11-19 DIAGNOSIS — M84871 Other disorders of continuity of bone, right ankle and foot: Secondary | ICD-10-CM | POA: Diagnosis not present

## 2022-11-19 DIAGNOSIS — Z4889 Encounter for other specified surgical aftercare: Secondary | ICD-10-CM | POA: Diagnosis not present

## 2022-11-19 DIAGNOSIS — T8484XA Pain due to internal orthopedic prosthetic devices, implants and grafts, initial encounter: Secondary | ICD-10-CM | POA: Diagnosis not present

## 2022-11-19 DIAGNOSIS — M89371 Hypertrophy of bone, right ankle and foot: Secondary | ICD-10-CM | POA: Diagnosis not present

## 2022-11-19 DIAGNOSIS — T8489XA Other specified complication of internal orthopedic prosthetic devices, implants and grafts, initial encounter: Secondary | ICD-10-CM | POA: Diagnosis not present

## 2022-11-19 NOTE — Telephone Encounter (Signed)
Patient with diagnosis of afib on Eliquis for anticoagulation.    Procedure: removal of tibial sesamoid Date of procedure: TBD  CHA2DS2-VASc Score = 4  This indicates a 4.8% annual risk of stroke. The patient's score is based upon: CHF History: 0 HTN History: 1 Diabetes History: 0 Stroke History: 0 Vascular Disease History: 1 Age Score: 2 Gender Score: 0   CrCl 22mL/min Platelet count 246K  Per office protocol, patient can hold Eliquis for 2 days prior to procedure.    **This guidance is not considered finalized until pre-operative APP has relayed final recommendations.**

## 2022-11-20 ENCOUNTER — Ambulatory Visit: Payer: Medicare Other | Admitting: Cardiology

## 2022-11-25 ENCOUNTER — Other Ambulatory Visit: Payer: Self-pay | Admitting: Cardiology

## 2022-11-27 ENCOUNTER — Ambulatory Visit (INDEPENDENT_AMBULATORY_CARE_PROVIDER_SITE_OTHER): Payer: Medicare Other | Admitting: Podiatry

## 2022-11-27 ENCOUNTER — Ambulatory Visit (INDEPENDENT_AMBULATORY_CARE_PROVIDER_SITE_OTHER): Payer: Medicare Other

## 2022-11-27 DIAGNOSIS — M7751 Other enthesopathy of right foot: Secondary | ICD-10-CM

## 2022-11-28 NOTE — Progress Notes (Signed)
Subjective:   Patient ID: Julian Weaver, male   DOB: 77 y.o.   MRN: [id number]   HPI Patient states doing very well very pleased with surgery   ROS      Objective:  Physical Exam  Neurovascular status intact negative Denna Haggard' sign noted wound edges coapted well stitches intact     Assessment:  Doing well post sesamoidectomy and removal of 2 screws right foot with good alignment noted     Plan:  H&P x-rays reviewed sterile dressing reapplied continue immobilization reappoint 2 weeks suture removal earlier if needed  X-rays indicate satisfactory section sesamoidal bone and screws x 2

## 2022-11-29 ENCOUNTER — Ambulatory Visit: Payer: Medicare Other | Attending: Cardiology | Admitting: Cardiology

## 2022-11-29 ENCOUNTER — Encounter: Payer: Self-pay | Admitting: Cardiology

## 2022-11-29 VITALS — BP 122/60 | HR 53 | Ht 68.0 in | Wt 177.0 lb

## 2022-11-29 DIAGNOSIS — I2510 Atherosclerotic heart disease of native coronary artery without angina pectoris: Secondary | ICD-10-CM | POA: Diagnosis not present

## 2022-11-29 DIAGNOSIS — I10 Essential (primary) hypertension: Secondary | ICD-10-CM | POA: Diagnosis not present

## 2022-11-29 DIAGNOSIS — E785 Hyperlipidemia, unspecified: Secondary | ICD-10-CM

## 2022-11-29 DIAGNOSIS — I480 Paroxysmal atrial fibrillation: Secondary | ICD-10-CM

## 2022-11-29 DIAGNOSIS — Z79899 Other long term (current) drug therapy: Secondary | ICD-10-CM

## 2022-11-29 DIAGNOSIS — I700 Atherosclerosis of aorta: Secondary | ICD-10-CM | POA: Diagnosis not present

## 2022-11-29 DIAGNOSIS — I6523 Occlusion and stenosis of bilateral carotid arteries: Secondary | ICD-10-CM

## 2022-11-29 NOTE — Patient Instructions (Signed)
Medication Instructions:  Your physician recommends that you continue on your current medications as directed. Please refer to the Current Medication list given to you today.  *If you need a refill on your cardiac medications before your next appointment, please call your pharmacy*   Lab Work: Please make an appointment to have a FASTING lipid panel and an ALT drawn in our lab at your earliest convenience.  If you have labs (blood work) drawn today and your tests are completely normal, you will receive your results only by: MyChart Message (if you have MyChart) OR A paper copy in the mail If you have any lab test that is abnormal or we need to change your treatment, we will call you to review the results.   Testing/Procedures: Your physician has requested that you have a carotid duplex. This test is an ultrasound of the carotid arteries in your neck. It looks at blood flow through these arteries that supply the brain with blood. Allow one hour for this exam. There are no restrictions or special instructions.    Follow-Up: At [facility], you and your health needs are our priority.  As part of our continuing mission to provide you with exceptional heart care, we have created designated Provider Care Teams.  These Care Teams include your primary Cardiologist (physician) and Advanced Practice Providers (APPs -  Physician Assistants and Nurse Practitioners) who all work together to provide you with the care you need, when you need it.  We recommend signing up for the patient portal called "MyChart".  Sign up information is provided on this After Visit Summary.  MyChart is used to connect with patients for Virtual Visits (Telemedicine).  Patients are able to view lab/test results, encounter notes, upcoming appointments, etc.  Non-urgent messages can be sent to your provider as well.   To learn more about what you can do with MyChart, go to [email or web address].    Your next  appointment:   1 year(s)  Provider:   Armanda Magic, MD

## 2022-11-29 NOTE — Progress Notes (Signed)
Cardiology Office Note:    Date:  11/29/2022   ID:  Julian Weaver, DOB 06/04/1945, MRN [id number]  PCP:  Shon Hale, MD  Cardiologist:  Armanda Magic, MD    Referring MD: Shon Hale, *   Chief Complaint  Patient presents with   Coronary Artery Disease   Hypertension   Hyperlipidemia   Atrial Fibrillation     History of Present Illness:    Julian Weaver is a 77 y.o. male with a hx of CAD status post PCI/DES RCA in 2010, hypertension, PAF on DOAC, hyperlipidemia, tobacco abuse, carotid bruits 1-39% dopplers 06/2020. 2D echo 2022 showed normal LVF with G1DD.  Nuclear stress test 2021 showed no ischemia.  He also has PAF and has been seen in afib clinic and placed on apixaban and Lopressor.   He is here today for followup and is doing well.  He denies any chest pain or pressure, SOB, DOE, PND, orthopnea, LE edema or syncope. He has very infrequent palpitations.  He does have problems with dizziness that he describes as feeling like he is off balance. He is compliant with his meds and is tolerating meds with no SE.      Past Medical History:  Diagnosis Date   Allergic rhinitis    Aortic atherosclerosis (HCC)    Arthritis    Carotid artery stenosis    1-39% bilateral dopplers 05/2020   Coronary artery disease 04/13/2008   Drug eluting stent in RCA // Myoview 04/2019: EF 52, normal perfusion; low risk   Hypercholesteremia    Hypertension    RBBB    Renal disorder    Tobacco abuse     Past Surgical History:  Procedure Laterality Date   CAROTID STENT  2010   SHOULDER ACROMIOPLASTY  2010    Current Medications: Current Meds  Medication Sig   albuterol (VENTOLIN HFA) 108 (90 Base) MCG/ACT inhaler SMARTSIG:2 Puff(s) By Mouth Every 4 Hours PRN   amLODipine (NORVASC) 5 MG tablet TAKE 1 TABLET(5 MG) BY MOUTH DAILY   apixaban (ELIQUIS) 5 MG TABS tablet Take 1 tablet (5 mg total) by mouth 2 (two) times daily.   aspirin EC 81 MG tablet Take 81  mg by mouth daily. Swallow whole.   atorvastatin (LIPITOR) 20 MG tablet TAKE 1 TABLET(20 MG) BY MOUTH DAILY   Coenzyme Q10 (CO Q-10) 100 MG CAPS Take 1 capsule by mouth every morning.   cyanocobalamin (VITAMIN B12) 1000 MCG tablet Take 1,000 mcg by mouth daily.   dronedarone (MULTAQ) 400 MG tablet Take 1 tablet (400 mg total) by mouth 2 (two) times daily with a meal.   EPINEPHrine (EPIPEN IJ) Inject as directed as needed (bee stings).    Fluticasone-Umeclidin-Vilant 100-62.5-25 MCG/ACT AEPB Inhale 1 puff into the lungs at bedtime. Not sure the dosage   HYDROcodone-acetaminophen (NORCO/VICODIN) 5-325 MG tablet Take 1-2 tablets by mouth every 6 (six) hours as needed.   metoprolol tartrate (LOPRESSOR) 25 MG tablet TAKE 1/2 TABLET BY MOUTH TWICE DAILY   mupirocin ointment (BACTROBAN) 2 % Apply 1 Application topically 2 (two) times daily.   nitroGLYCERIN (NITROSTAT) 0.4 MG SL tablet Place 1 tablet (0.4 mg total) under the tongue every 5 (five) minutes as needed for chest pain.   omeprazole (PRILOSEC) 20 MG capsule Take 20 mg by mouth as needed.   Current Facility-Administered Medications for the 11/29/22 encounter (Office Visit) with Quintella Reichert, MD  Medication   triamcinolone acetonide (KENALOG) 10 MG/ML injection 10 mg  Allergies:   Bee venom, Amoxicillin-pot clavulanate, Hydromorphone, and Tramadol   Social History   Socioeconomic History   Marital status: Married    Spouse name: Not on file   Number of children: Not on file   Years of education: Not on file   Highest education level: Not on file  Occupational History   Not on file  Tobacco Use   Smoking status: Every Day    Current packs/day: 1.00    Average packs/day: 1 pack/day for 56.1 years (56.1 ttl pk-yrs)    Types: Cigarettes    Start date: 10/27/1982   Smokeless tobacco: Never   Tobacco comments:    Half-pack daily 11/09/2020  Substance and Sexual Activity   Alcohol use: Yes    Alcohol/week: 1.0 standard drink of  alcohol    Types: 1 Cans of beer per week    Comment: Rarely- 3 times a month   Drug use: No   Sexual activity: Not on file  Other Topics Concern   Not on file  Social History Narrative   Not on file   Social Determinants of Health   Financial Resource Strain: Not on file  Food Insecurity: Not on file  Transportation Needs: Not on file  Physical Activity: Not on file  Stress: Not on file  Social Connections: Not on file     Family History: The patient's family history includes Stroke in his father and mother.  ROS:   Please see the history of present illness.    ROS  All other systems reviewed and negative.   EKGs/Labs/Other Studies Reviewed:    The following studies were reviewed today:  Recent Labs: 02/06/2022: ALT 21 11/06/2022: Hemoglobin 11.5; Platelets 246   Recent Lipid Panel    Component Value Date/Time   CHOL 91 (L) 02/06/2022 0855   TRIG 72 02/06/2022 0855   HDL 39 (L) 02/06/2022 0855   CHOLHDL 2.3 02/06/2022 0855   CHOLHDL 2.5 05/24/2015 0816   VLDL 16 05/24/2015 0816   LDLCALC 37 02/06/2022 0855    Physical Exam:    VS:  BP 122/60   Pulse (!) 53   Ht 5\' 8"  (1.727 m)   Wt 177 lb (80.3 kg)   SpO2 98%   BMI 26.91 kg/m     No data found.    Wt Readings from Last 3 Encounters:  11/29/22 177 lb (80.3 kg)  11/06/22 176 lb 3.2 oz (79.9 kg)  08/21/22 176 lb (79.8 kg)    GEN: Well nourished, well developed in no acute distress HEENT: Normal NECK: No JVD; left carotid bruit LYMPHATICS: No lymphadenopathy CARDIAC:RRR, no murmurs, rubs, gallops RESPIRATORY:  Clear to auscultation without rales, wheezing or rhonchi  ABDOMEN: Soft, non-tender, non-distended MUSCULOSKELETAL:  No edema; No deformity  SKIN: Warm and dry NEUROLOGIC:  Alert and oriented x 3 PSYCHIATRIC:  Normal affect  ASSESSMENT:    1. Paroxysmal atrial fibrillation (HCC)   3. Bilateral carotid artery stenosis   4. Essential hypertension, benign   5. Pure hypercholesterolemia     PLAN:    In order of problems listed above:  1.  PAF -He is maintaining normal sinus rhythm on exam today denies any palpitations or bleeding problems on DOAC -Continue prescription drug management with apixaban 5 mg twice daily, Multaq 400 mg twice daily and Lopressor 12.5 mg twice daily with as needed refills -I have personally reviewed and interpreted outside labs performed by patient's PCP which showed serum creatinine 1.03 and potassium 4.2 on 07/31/2022 and  hemoglobin 11.5 on 11/06/2022  2.  Bilateral carotid artery stenosis -Repeat dopplers 06/2020 showed stable 1-39% bilateral stenosis -continue statin -Repeat carotid Dopplers  3.  HTN -He is stable on exam today -Continue prescription drug management with amlodipine 5 mg daily, Lopressor 12.5 mg twice daily with as needed refills  4.  HLD -LDL goal < 70 -I have personally reviewed and interpreted outside labs performed by patient's PCP which showed LDL 37 and HDL 39 on 02/06/2022 -Continue prescription drug management with atorvastatin 20 mg daily with as needed refills -Repeat FLP and ALT  5.  ASCAD/Aortic atherosclerosis -status post PCI/DES RCA in 2010 -He denies any anginal symptoms -Aspirin 81 mg daily atorvastatin 20 mg daily  7.  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  Concerns regarding medicines are outlined above.  No orders of the defined types were placed in this encounter.  No orders of the defined types were placed in this encounter.   Signed, Armanda Magic, MD  11/29/2022 8:25 AM    [facility]

## 2022-12-11 ENCOUNTER — Ambulatory Visit: Payer: Medicare Other

## 2022-12-11 ENCOUNTER — Ambulatory Visit (INDEPENDENT_AMBULATORY_CARE_PROVIDER_SITE_OTHER): Payer: Medicare Other

## 2022-12-11 ENCOUNTER — Ambulatory Visit
Admission: RE | Admit: 2022-12-11 | Discharge: 2022-12-11 | Disposition: A | Discharge status: Home / Self Care | Payer: Medicare Other | Source: Ambulatory Visit | Attending: Cardiovascular Disease | Admitting: Cardiovascular Disease

## 2022-12-11 ENCOUNTER — Encounter: Payer: Self-pay | Admitting: Podiatry

## 2022-12-11 ENCOUNTER — Ambulatory Visit (INDEPENDENT_AMBULATORY_CARE_PROVIDER_SITE_OTHER): Payer: Medicare Other | Admitting: Podiatry

## 2022-12-11 DIAGNOSIS — Z9889 Other specified postprocedural states: Secondary | ICD-10-CM

## 2022-12-11 DIAGNOSIS — E785 Hyperlipidemia, unspecified: Secondary | ICD-10-CM | POA: Insufficient documentation

## 2022-12-11 DIAGNOSIS — M7751 Other enthesopathy of right foot: Secondary | ICD-10-CM | POA: Diagnosis not present

## 2022-12-11 DIAGNOSIS — Z79899 Other long term (current) drug therapy: Secondary | ICD-10-CM

## 2022-12-11 DIAGNOSIS — I2510 Atherosclerotic heart disease of native coronary artery without angina pectoris: Secondary | ICD-10-CM | POA: Diagnosis not present

## 2022-12-11 DIAGNOSIS — R0989 Other specified symptoms and signs involving the circulatory and respiratory systems: Secondary | ICD-10-CM | POA: Insufficient documentation

## 2022-12-11 LAB — LIPID PANEL
Chol/HDL Ratio: 3.7 ratio (ref 0.0–5.0)
Cholesterol, Total: 153 mg/dL (ref 100–199)
HDL: 41 mg/dL (ref >39)
LDL Chol Calc (NIH): 90 mg/dL (ref 0–99)
Triglycerides: 125 mg/dL (ref 0–149)
VLDL Cholesterol Cal: 22 mg/dL (ref 5–40)

## 2022-12-11 LAB — ALT: ALT: 17 IU/L (ref 0–44)

## 2022-12-12 NOTE — Progress Notes (Signed)
Subjective:   Patient ID: Julian Weaver, male   DOB: 77 y.o.   MRN: [id number]   HPI Patient states doing very well with surgery here for stitch removal   ROS      Objective:  Physical Exam  Neurovascular status intact wound edges medial and dorsal right foot healing well no drainage wound edges well coapted     Assessment:  Doing well post sesamoidectomy removal of screws right     Plan:  H&P reviewed all stitches removed wound edges coapted well gradual increase in activity levels and reappoint in the next 2 to 4 weeks to reevaluate earlier if needed

## 2022-12-13 ENCOUNTER — Telehealth: Payer: Self-pay

## 2022-12-13 DIAGNOSIS — I6523 Occlusion and stenosis of bilateral carotid arteries: Secondary | ICD-10-CM

## 2022-12-13 NOTE — Telephone Encounter (Signed)
-----   Message from Nurse Sherri P sent at 12/11/2022  6:25 PM EDT ----- Julian Weaver, forwarding to you as Tresa Endo said you were in follow up tomorrow. ----- Message ----- From: Julian Reichert, MD Sent: 12/11/2022   5:40 PM EDT To: Shon Hale, MD; [address] Triage  Please find out if he has missed any doses of his statin - his LDL is up

## 2022-12-13 NOTE — Telephone Encounter (Signed)
Call to patient to discuss cholesterol labs- patient states he was off some of his meds, including his lipitor when he had foot surgery 2-3 weeks ago. He states he is back on his meds now and requests to repeat cholesterol labs in 6-8 weeks after he has been back on his medications regularly for awhile. Forwarded to Dr. Mayford Knife.

## 2022-12-15 ENCOUNTER — Encounter: Payer: Self-pay | Admitting: Cardiology

## 2022-12-18 NOTE — Telephone Encounter (Signed)
Called patient in regard to carotid results and informed.  Additionally, Dr. Mayford Knife requests repeat lipids in 6 weeks. Scheduled labs for 01/24/23  ---------------------------------------------------------------------------------------------------- Quintella Reichert, MD  P Cv Div Ch St Triage Cc: Shon Hale, MD 1-39% bilateral carotid artery stenosis - repeat dopplers in 1 year

## 2022-12-18 NOTE — Addendum Note (Signed)
Addended by: Lendon Ka on: 12/18/2022 08:49 AM   Modules accepted: Orders

## 2022-12-23 ENCOUNTER — Telehealth: Payer: Self-pay | Admitting: Cardiology

## 2022-12-23 MED ORDER — APIXABAN 5 MG PO TABS: 5.0000 mg | ORAL_TABLET | Freq: Two times a day (BID) | ORAL | 1 refills | Status: DC

## 2022-12-23 NOTE — Telephone Encounter (Signed)
Prescription refill request for Eliquis received. Indication: PAF Last office visit: 11/29/22  Kym Groom MD Scr: 1.03 on 07/31/22  KPN Age: 77 Weight: 80.3kg  Based on above findings Eliquis 5mg  twice daily is the appropriate dose.  Refill approved.

## 2022-12-23 NOTE — Telephone Encounter (Signed)
*  STAT* If patient is at the pharmacy, call can be transferred to refill team.   1. Which medications need to be refilled? (please list name of each medication and dose if known)   apixaban (ELIQUIS) 5 MG TABS tablet      4. Which pharmacy/location (including street and city if local pharmacy) is medication to be sent to? [id number] - [place], [place] - [address] AT [facility] OF [address]     5. Do they need a 30 day or 90 day supply? 90

## 2022-12-24 ENCOUNTER — Other Ambulatory Visit: Payer: Self-pay | Admitting: Cardiology

## 2022-12-30 ENCOUNTER — Encounter: Payer: Self-pay | Admitting: Podiatry

## 2022-12-30 ENCOUNTER — Ambulatory Visit (INDEPENDENT_AMBULATORY_CARE_PROVIDER_SITE_OTHER): Payer: Medicare Other | Admitting: Podiatry

## 2022-12-30 DIAGNOSIS — M7751 Other enthesopathy of right foot: Secondary | ICD-10-CM

## 2023-01-01 NOTE — Progress Notes (Signed)
Subjective:   Patient ID: Julian Weaver, male   DOB: 77 y.o.   MRN: [id number]   HPI Patient states he has had several bouts of swelling that last short periods of time and then seems to go away.  States that overall his foot feels much better but this does not seem to be related to the surgery   ROS      Objective:  Physical Exam  Neuro vascular status intact with inflammation around the second MPJ right with the site on the right first metatarsal healing well     Assessment:  Possibility for capsulitis with gait change with what appears to be well-healing surgical site right     Plan:  Reviewed condition and I want him to wear stiff bottom shoes and compression along with elevation.  We may have to put medicine into this area but I would rather wait a little while and see if he gets better as this

## 2023-01-11 DIAGNOSIS — Z23 Encounter for immunization: Secondary | ICD-10-CM | POA: Diagnosis not present

## 2023-01-24 ENCOUNTER — Ambulatory Visit: Payer: Medicare Other | Attending: Cardiology

## 2023-01-24 DIAGNOSIS — I6523 Occlusion and stenosis of bilateral carotid arteries: Secondary | ICD-10-CM | POA: Diagnosis not present

## 2023-01-25 LAB — LIPID PANEL
Chol/HDL Ratio: 2.1 ratio (ref 0.0–5.0)
Cholesterol, Total: 88 mg/dL — ABNORMAL LOW (ref 100–199)
HDL: 41 mg/dL (ref >39)
LDL Chol Calc (NIH): 32 mg/dL (ref 0–99)
Triglycerides: 70 mg/dL (ref 0–149)
VLDL Cholesterol Cal: 15 mg/dL (ref 5–40)

## 2023-01-27 ENCOUNTER — Telehealth: Payer: Self-pay

## 2023-01-27 NOTE — Telephone Encounter (Signed)
-----   Message from Armanda Magic sent at 01/26/2023  6:59 PM EST ----- Lipids at goal continue current therapy and forward to PCP

## 2023-01-27 NOTE — Telephone Encounter (Signed)
Called to discuss cholesterol results with patient, no answer. Left detailed message per DPR to advise that lipids are at goal and Dr. Mayford Knife would like him to continue his current meds. Advised a copy fwould be forwarded to his PCP and asked patient to call our office if any questions.

## 2023-02-18 DIAGNOSIS — R7301 Impaired fasting glucose: Secondary | ICD-10-CM | POA: Diagnosis not present

## 2023-02-18 DIAGNOSIS — R7303 Prediabetes: Secondary | ICD-10-CM | POA: Diagnosis not present

## 2023-02-18 DIAGNOSIS — Z0000 Encounter for general adult medical examination without abnormal findings: Secondary | ICD-10-CM | POA: Diagnosis not present

## 2023-02-18 DIAGNOSIS — Z23 Encounter for immunization: Secondary | ICD-10-CM | POA: Diagnosis not present

## 2023-02-18 DIAGNOSIS — J449 Chronic obstructive pulmonary disease, unspecified: Secondary | ICD-10-CM | POA: Diagnosis not present

## 2023-02-18 DIAGNOSIS — M1991 Primary osteoarthritis, unspecified site: Secondary | ICD-10-CM | POA: Diagnosis not present

## 2023-02-18 DIAGNOSIS — F17210 Nicotine dependence, cigarettes, uncomplicated: Secondary | ICD-10-CM | POA: Diagnosis not present

## 2023-02-18 DIAGNOSIS — E7800 Pure hypercholesterolemia, unspecified: Secondary | ICD-10-CM | POA: Diagnosis not present

## 2023-02-18 DIAGNOSIS — I700 Atherosclerosis of aorta: Secondary | ICD-10-CM | POA: Diagnosis not present

## 2023-02-18 DIAGNOSIS — I10 Essential (primary) hypertension: Secondary | ICD-10-CM | POA: Diagnosis not present

## 2023-02-18 DIAGNOSIS — R195 Other fecal abnormalities: Secondary | ICD-10-CM | POA: Diagnosis not present

## 2023-02-18 DIAGNOSIS — D6869 Other thrombophilia: Secondary | ICD-10-CM | POA: Diagnosis not present

## 2023-02-18 DIAGNOSIS — M79672 Pain in left foot: Secondary | ICD-10-CM | POA: Diagnosis not present

## 2023-03-06 DIAGNOSIS — Z20822 Contact with and (suspected) exposure to covid-19: Secondary | ICD-10-CM | POA: Diagnosis not present

## 2023-03-06 DIAGNOSIS — J029 Acute pharyngitis, unspecified: Secondary | ICD-10-CM | POA: Diagnosis not present

## 2023-03-06 DIAGNOSIS — R0602 Shortness of breath: Secondary | ICD-10-CM | POA: Diagnosis not present

## 2023-03-06 DIAGNOSIS — R059 Cough, unspecified: Secondary | ICD-10-CM | POA: Diagnosis not present

## 2023-03-06 DIAGNOSIS — J441 Chronic obstructive pulmonary disease with (acute) exacerbation: Secondary | ICD-10-CM | POA: Diagnosis not present

## 2023-03-10 ENCOUNTER — Telehealth: Payer: Self-pay | Admitting: Cardiology

## 2023-03-10 DIAGNOSIS — R55 Syncope and collapse: Secondary | ICD-10-CM

## 2023-03-10 NOTE — Telephone Encounter (Signed)
Pt called to report that his PCP put him on an antibiotic for a "lung infection"... he was eating dinner this past Saturday and he was coughing very hard and could not catch his breath and he passed out and lost consciousness... his face hit his plate of food.   His wife helped to wake him and clean him up and she rested since then and has felt well except for the cough.   He denies chest pain, dizziness, palpitations. No signs of CVA.  He talked to his PCP nurse today and she is going to call him back with a plan.   I advised him that until we know the exact source of his Syncope he should not be driving a motor vehicle.   I will send to Dr Mayford Knife and her nurse for review.   Any further episodes such as this he will call EMS.

## 2023-03-10 NOTE — Telephone Encounter (Signed)
.  SYNCOPECHMG   Pt c/o Syncope: STAT if syncope occurred within 24 hours and pt complains of lightheadedness.   High Priority if episode of passing out, completely, today or in last 24 hours   1. Did you pass out today? No   2. When is the last time you passed out?   Saturday   3. Has this occurred multiple times?   No  4. Did you have any symptoms prior to passing out?   No  5. Did you fall? If so, are you on a blood thinner?   No, patient stated he was sitting at supper.  Patient noted he is on a blood thinner.  Patient stated he had been sick and given antibiotics and on Saturday while having supper he passed out.  Patient stated he has been having SOB and his color is a little off.

## 2023-03-11 NOTE — Telephone Encounter (Signed)
Call to patient to advise that Dr. Mayford Knife recommends Echo and 2 week home heart monitor. Patient agrees to plan stating "I have done both before" and verbalizes understanding, orders placed.

## 2023-03-12 ENCOUNTER — Ambulatory Visit: Payer: Medicare Other | Attending: Cardiology

## 2023-03-12 DIAGNOSIS — R55 Syncope and collapse: Secondary | ICD-10-CM

## 2023-03-12 NOTE — Progress Notes (Unsigned)
Enrolled patient for a 14 day Zio XT  monitor to be mailed to patients home  °

## 2023-03-15 DIAGNOSIS — J22 Unspecified acute lower respiratory infection: Secondary | ICD-10-CM | POA: Diagnosis not present

## 2023-03-15 DIAGNOSIS — R55 Syncope and collapse: Secondary | ICD-10-CM | POA: Diagnosis not present

## 2023-03-20 ENCOUNTER — Ambulatory Visit
Admission: RE | Admit: 2023-03-20 | Discharge: 2023-03-20 | Disposition: A | Discharge status: Home / Self Care | Payer: Medicare Other | Source: Ambulatory Visit | Attending: Acute Care | Admitting: Acute Care

## 2023-03-20 DIAGNOSIS — I2510 Atherosclerotic heart disease of native coronary artery without angina pectoris: Secondary | ICD-10-CM | POA: Diagnosis not present

## 2023-03-20 DIAGNOSIS — J439 Emphysema, unspecified: Secondary | ICD-10-CM | POA: Diagnosis not present

## 2023-03-20 DIAGNOSIS — R911 Solitary pulmonary nodule: Secondary | ICD-10-CM

## 2023-03-20 DIAGNOSIS — Z87891 Personal history of nicotine dependence: Secondary | ICD-10-CM

## 2023-03-20 DIAGNOSIS — I700 Atherosclerosis of aorta: Secondary | ICD-10-CM | POA: Diagnosis not present

## 2023-03-21 ENCOUNTER — Ambulatory Visit (HOSPITAL_COMMUNITY): Payer: Medicare Other | Attending: Cardiology

## 2023-03-21 ENCOUNTER — Telehealth: Payer: Self-pay

## 2023-03-21 ENCOUNTER — Telehealth (HOSPITAL_COMMUNITY): Payer: Self-pay | Admitting: *Deleted

## 2023-03-21 ENCOUNTER — Telehealth (HOSPITAL_COMMUNITY): Payer: Self-pay | Admitting: Radiology

## 2023-03-21 DIAGNOSIS — R55 Syncope and collapse: Secondary | ICD-10-CM | POA: Insufficient documentation

## 2023-03-21 LAB — ECHOCARDIOGRAM COMPLETE: S' Lateral: 3 cm

## 2023-03-21 NOTE — Telephone Encounter (Signed)
See separate encounter addressing.

## 2023-03-21 NOTE — Telephone Encounter (Signed)
Patient arrived for echocardiogram. He stated he had an "atria fibrillation attack" last night. Patient's heart rate 112 bpm with bp of 106/54.

## 2023-03-21 NOTE — Telephone Encounter (Signed)
Patient called in stating he converted back to afib yesterday evening. HRs 100-120 feels ok overall just can feel the palpitations. Per Jorja Loa PA can use extra 1/2 tablet of metoprolol BID PRN for elevated rates. Pt did take an extra 1/2 metoprolol last night but only his normal dosing this morning. Instructed pt to take extra 1/2 tablet of metoprolol (to total 25mg  BID) if still in afib next week pt will call back to be seen for assessment. If returns to normal rhythm will return to normal dosing of metoprolol. Pt verbalized agreement.

## 2023-03-21 NOTE — Telephone Encounter (Signed)
Call to patient to advise of essentially normal echo per Dr. Mayford Knife. No answer, left message advising of same per DPR, asked patient to call our office if any questions or concerns.

## 2023-03-21 NOTE — Telephone Encounter (Signed)
-----   Message from Armanda Magic sent at 03/21/2023  2:28 PM EST ----- Essentially normal echo

## 2023-03-24 DIAGNOSIS — R55 Syncope and collapse: Secondary | ICD-10-CM

## 2023-03-25 ENCOUNTER — Encounter (HOSPITAL_COMMUNITY): Payer: Self-pay

## 2023-03-25 ENCOUNTER — Emergency Department (HOSPITAL_COMMUNITY): Payer: Medicare Other

## 2023-03-25 ENCOUNTER — Other Ambulatory Visit: Payer: Self-pay

## 2023-03-25 ENCOUNTER — Inpatient Hospital Stay (HOSPITAL_COMMUNITY)
Admission: EM | Admit: 2023-03-25 | Discharge: 2023-03-28 | DRG: 378 | Disposition: A | Discharge status: Home / Self Care | Payer: Medicare Other | Attending: Family Medicine | Admitting: Family Medicine

## 2023-03-25 DIAGNOSIS — I700 Atherosclerosis of aorta: Secondary | ICD-10-CM | POA: Diagnosis present

## 2023-03-25 DIAGNOSIS — Z955 Presence of coronary angioplasty implant and graft: Secondary | ICD-10-CM

## 2023-03-25 DIAGNOSIS — R42 Dizziness and giddiness: Secondary | ICD-10-CM | POA: Diagnosis not present

## 2023-03-25 DIAGNOSIS — R55 Syncope and collapse: Secondary | ICD-10-CM | POA: Diagnosis present

## 2023-03-25 DIAGNOSIS — Z79899 Other long term (current) drug therapy: Secondary | ICD-10-CM

## 2023-03-25 DIAGNOSIS — S0990XA Unspecified injury of head, initial encounter: Secondary | ICD-10-CM | POA: Diagnosis not present

## 2023-03-25 DIAGNOSIS — Z91030 Bee allergy status: Secondary | ICD-10-CM

## 2023-03-25 DIAGNOSIS — M1990 Unspecified osteoarthritis, unspecified site: Secondary | ICD-10-CM | POA: Diagnosis present

## 2023-03-25 DIAGNOSIS — I2510 Atherosclerotic heart disease of native coronary artery without angina pectoris: Secondary | ICD-10-CM | POA: Diagnosis not present

## 2023-03-25 DIAGNOSIS — E876 Hypokalemia: Secondary | ICD-10-CM | POA: Diagnosis present

## 2023-03-25 DIAGNOSIS — D62 Acute posthemorrhagic anemia: Secondary | ICD-10-CM | POA: Diagnosis present

## 2023-03-25 DIAGNOSIS — I10 Essential (primary) hypertension: Secondary | ICD-10-CM | POA: Diagnosis present

## 2023-03-25 DIAGNOSIS — E871 Hypo-osmolality and hyponatremia: Secondary | ICD-10-CM | POA: Diagnosis present

## 2023-03-25 DIAGNOSIS — Z880 Allergy status to penicillin: Secondary | ICD-10-CM

## 2023-03-25 DIAGNOSIS — Z885 Allergy status to narcotic agent status: Secondary | ICD-10-CM

## 2023-03-25 DIAGNOSIS — K922 Gastrointestinal hemorrhage, unspecified: Secondary | ICD-10-CM | POA: Diagnosis present

## 2023-03-25 DIAGNOSIS — E7800 Pure hypercholesterolemia, unspecified: Secondary | ICD-10-CM | POA: Diagnosis present

## 2023-03-25 DIAGNOSIS — Z8709 Personal history of other diseases of the respiratory system: Secondary | ICD-10-CM | POA: Diagnosis not present

## 2023-03-25 DIAGNOSIS — Z8613 Personal history of malaria: Secondary | ICD-10-CM | POA: Diagnosis not present

## 2023-03-25 DIAGNOSIS — Z8701 Personal history of pneumonia (recurrent): Secondary | ICD-10-CM | POA: Diagnosis not present

## 2023-03-25 DIAGNOSIS — D649 Anemia, unspecified: Principal | ICD-10-CM | POA: Diagnosis present

## 2023-03-25 DIAGNOSIS — Z7951 Long term (current) use of inhaled steroids: Secondary | ICD-10-CM

## 2023-03-25 DIAGNOSIS — K5730 Diverticulosis of large intestine without perforation or abscess without bleeding: Secondary | ICD-10-CM | POA: Insufficient documentation

## 2023-03-25 DIAGNOSIS — I4510 Unspecified right bundle-branch block: Secondary | ICD-10-CM | POA: Diagnosis present

## 2023-03-25 DIAGNOSIS — Z720 Tobacco use: Secondary | ICD-10-CM | POA: Diagnosis present

## 2023-03-25 DIAGNOSIS — E782 Mixed hyperlipidemia: Secondary | ICD-10-CM | POA: Diagnosis not present

## 2023-03-25 DIAGNOSIS — K640 First degree hemorrhoids: Secondary | ICD-10-CM | POA: Diagnosis not present

## 2023-03-25 DIAGNOSIS — R195 Other fecal abnormalities: Secondary | ICD-10-CM | POA: Diagnosis not present

## 2023-03-25 DIAGNOSIS — K5790 Diverticulosis of intestine, part unspecified, without perforation or abscess without bleeding: Secondary | ICD-10-CM | POA: Diagnosis not present

## 2023-03-25 DIAGNOSIS — D509 Iron deficiency anemia, unspecified: Secondary | ICD-10-CM | POA: Diagnosis present

## 2023-03-25 DIAGNOSIS — R918 Other nonspecific abnormal finding of lung field: Secondary | ICD-10-CM | POA: Diagnosis not present

## 2023-03-25 DIAGNOSIS — I480 Paroxysmal atrial fibrillation: Secondary | ICD-10-CM | POA: Diagnosis not present

## 2023-03-25 DIAGNOSIS — K648 Other hemorrhoids: Secondary | ICD-10-CM | POA: Insufficient documentation

## 2023-03-25 DIAGNOSIS — Z7901 Long term (current) use of anticoagulants: Secondary | ICD-10-CM | POA: Diagnosis not present

## 2023-03-25 DIAGNOSIS — I951 Orthostatic hypotension: Secondary | ICD-10-CM | POA: Diagnosis present

## 2023-03-25 DIAGNOSIS — F17210 Nicotine dependence, cigarettes, uncomplicated: Secondary | ICD-10-CM | POA: Diagnosis present

## 2023-03-25 DIAGNOSIS — E785 Hyperlipidemia, unspecified: Secondary | ICD-10-CM | POA: Diagnosis present

## 2023-03-25 DIAGNOSIS — R0602 Shortness of breath: Secondary | ICD-10-CM | POA: Diagnosis not present

## 2023-03-25 DIAGNOSIS — J440 Chronic obstructive pulmonary disease with acute lower respiratory infection: Secondary | ICD-10-CM | POA: Diagnosis present

## 2023-03-25 DIAGNOSIS — Z7982 Long term (current) use of aspirin: Secondary | ICD-10-CM

## 2023-03-25 DIAGNOSIS — R2243 Localized swelling, mass and lump, lower limb, bilateral: Secondary | ICD-10-CM | POA: Diagnosis not present

## 2023-03-25 LAB — IRON AND TIBC
Iron: 16 ug/dL — ABNORMAL LOW (ref 45–182)
Saturation Ratios: 4 % — ABNORMAL LOW (ref 17.9–39.5)
TIBC: 449 ug/dL (ref 250–450)
UIBC: 433 ug/dL

## 2023-03-25 LAB — PROTIME-INR
INR: 1.2 (ref 0.8–1.2)
Prothrombin Time: 15.1 s (ref 11.4–15.2)

## 2023-03-25 LAB — CBC WITH DIFFERENTIAL/PLATELET
Abs Immature Granulocytes: 0.04 10*3/uL (ref 0.00–0.07)
Basophils Absolute: 0 10*3/uL (ref 0.0–0.1)
Basophils Relative: 0 %
Eosinophils Absolute: 0.2 10*3/uL (ref 0.0–0.5)
Eosinophils Relative: 2 %
HCT: 19.2 % — ABNORMAL LOW (ref 39.0–52.0)
Hemoglobin: 5.8 g/dL — CL (ref 13.0–17.0)
Immature Granulocytes: 1 %
Lymphocytes Relative: 12 %
Lymphs Abs: 1 10*3/uL (ref 0.7–4.0)
MCH: 24.2 pg — ABNORMAL LOW (ref 26.0–34.0)
MCHC: 30.2 g/dL (ref 30.0–36.0)
MCV: 80 fL (ref 80.0–100.0)
Monocytes Absolute: 0.7 10*3/uL (ref 0.1–1.0)
Monocytes Relative: 9 %
Neutro Abs: 6.2 10*3/uL (ref 1.7–7.7)
Neutrophils Relative %: 76 %
Platelets: 180 10*3/uL (ref 150–400)
RBC: 2.4 MIL/uL — ABNORMAL LOW (ref 4.22–5.81)
RDW: 16.6 % — ABNORMAL HIGH (ref 11.5–15.5)
WBC: 8.1 10*3/uL (ref 4.0–10.5)
nRBC: 0 % (ref 0.0–0.2)

## 2023-03-25 LAB — COMPREHENSIVE METABOLIC PANEL
ALT: 20 U/L (ref 0–44)
AST: 23 U/L (ref 15–41)
Albumin: 2.9 g/dL — ABNORMAL LOW (ref 3.5–5.0)
Alkaline Phosphatase: 73 U/L (ref 38–126)
Anion gap: 12 (ref 5–15)
BUN: 15 mg/dL (ref 8–23)
CO2: 21 mmol/L — ABNORMAL LOW (ref 22–32)
Calcium: 7.8 mg/dL — ABNORMAL LOW (ref 8.9–10.3)
Chloride: 101 mmol/L (ref 98–111)
Creatinine, Ser: 1.24 mg/dL (ref 0.61–1.24)
GFR, Estimated: >60 mL/min (ref >60)
Glucose, Bld: 128 mg/dL — ABNORMAL HIGH (ref 70–99)
Potassium: 3.7 mmol/L (ref 3.5–5.1)
Sodium: 134 mmol/L — ABNORMAL LOW (ref 135–145)
Total Bilirubin: 0.5 mg/dL (ref 0.0–1.2)
Total Protein: 5.6 g/dL — ABNORMAL LOW (ref 6.5–8.1)

## 2023-03-25 LAB — I-STAT CHEM 8, ED
BUN: 13 mg/dL (ref 8–23)
Calcium, Ion: 1.01 mmol/L — ABNORMAL LOW (ref 1.15–1.40)
Chloride: 101 mmol/L (ref 98–111)
Creatinine, Ser: 1.2 mg/dL (ref 0.61–1.24)
Glucose, Bld: 123 mg/dL — ABNORMAL HIGH (ref 70–99)
HCT: 19 % — ABNORMAL LOW (ref 39.0–52.0)
Hemoglobin: 6.5 g/dL — CL (ref 13.0–17.0)
Potassium: 3.7 mmol/L (ref 3.5–5.1)
Sodium: 134 mmol/L — ABNORMAL LOW (ref 135–145)
TCO2: 20 mmol/L — ABNORMAL LOW (ref 22–32)

## 2023-03-25 LAB — RETICULOCYTES
Immature Retic Fract: 22 % — ABNORMAL HIGH (ref 2.3–15.9)
RBC.: 2.82 MIL/uL — ABNORMAL LOW (ref 4.22–5.81)
Retic Count, Absolute: 75 10*3/uL (ref 19.0–186.0)
Retic Ct Pct: 2.6 % (ref 0.4–3.1)

## 2023-03-25 LAB — POC OCCULT BLOOD, ED: Fecal Occult Bld: NEGATIVE

## 2023-03-25 LAB — ABO/RH: ABO/RH(D): O POS

## 2023-03-25 LAB — FERRITIN: Ferritin: 6 ng/mL — ABNORMAL LOW (ref 24–336)

## 2023-03-25 LAB — FOLATE: Folate: 25 ng/mL (ref >5.9)

## 2023-03-25 LAB — TECHNOLOGIST SMEAR REVIEW

## 2023-03-25 LAB — APTT: aPTT: 35 s (ref 24–36)

## 2023-03-25 LAB — MAGNESIUM: Magnesium: 1.8 mg/dL (ref 1.7–2.4)

## 2023-03-25 LAB — TROPONIN I (HIGH SENSITIVITY)
Troponin I (High Sensitivity): 9 ng/L (ref <18)
Troponin I (High Sensitivity): 8 ng/L (ref <18)

## 2023-03-25 LAB — BRAIN NATRIURETIC PEPTIDE: B Natriuretic Peptide: 273.5 pg/mL — ABNORMAL HIGH (ref 0.0–100.0)

## 2023-03-25 LAB — PREPARE RBC (CROSSMATCH)

## 2023-03-25 LAB — VITAMIN B12: Vitamin B-12: 831 pg/mL (ref 180–914)

## 2023-03-25 MED ORDER — SODIUM CHLORIDE 0.9% IV SOLUTION: Freq: Once | INTRAVENOUS | Status: AC

## 2023-03-25 MED ORDER — ACETAMINOPHEN 650 MG RE SUPP: 650.0000 mg | Freq: Four times a day (QID) | RECTAL | Status: DC | PRN

## 2023-03-25 MED ORDER — MELATONIN 3 MG PO TABS
3.0000 mg | ORAL_TABLET | Freq: Every evening | ORAL | Status: DC | PRN
  Administered 2023-03-27: 3 mg via ORAL
  Filled 2023-03-25: qty 1

## 2023-03-25 MED ORDER — METOPROLOL TARTRATE 12.5 MG HALF TABLET
12.5000 mg | ORAL_TABLET | Freq: Two times a day (BID) | ORAL | Status: DC
  Administered 2023-03-25 – 2023-03-27 (×5): 12.5 mg via ORAL
  Filled 2023-03-25 (×5): qty 1

## 2023-03-25 MED ORDER — ONDANSETRON HCL 4 MG/2ML IJ SOLN: 4.0000 mg | Freq: Four times a day (QID) | INTRAMUSCULAR | Status: DC | PRN

## 2023-03-25 MED ORDER — ACETAMINOPHEN 325 MG PO TABS: 650.0000 mg | ORAL_TABLET | Freq: Four times a day (QID) | ORAL | Status: DC | PRN

## 2023-03-25 NOTE — ED Triage Notes (Addendum)
Pt reports recent abx for pna. Seen at PCP this morning and was sent here for low HGB. Pt endorses SHOB, dizziness, and fatigue. Pt states that he had a syncopal episode about a week ago.

## 2023-03-25 NOTE — ED Provider Notes (Signed)
 [facility] Provider Note   CSN: [id number] Arrival date & time: 03/25/23  1538     History Chief Complaint  Patient presents with   Abnormal Labs   Dizziness   Fatigue    Julian Weaver is a 77 y.o. male with history of A-fib on Eliquis , COPD presents emergency department today for evaluation of abnormal labs.  Patient has been experiencing shortness of breath for the past 2-1/2 to 3 weeks.  Reports he started off with a cold that developed into pneumonia.  He was placed on 2 different antibiotics and had a steroid shot but still is not experiencing any improvement of his shortness of breath.  He was seen by his primary care doctor for lab work today which showed that his hemoglobin was low and was told to come into the emergency department.  He denies any hematochezia or any melena.  Denies any dysuria or hematuria.  He reports that he did have a syncopal episode last week at the dinner table.  Triage note says that he hit his head however he, he passed out at the table.  Denies any headache.  Additionally he is been having some lower extremity swelling for some weeks.  Was placed on Lasix today.  Reports he has been having increase in urinary output since then.  He reports his only blood transfusion history was when he had malaria decades ago while he was in the [employer].  He reports compliance to his Eliquis  and denies any missed doses.  Reports occasional smoking.  Denies any EtOH or illicit drug use.   Dizziness Associated symptoms: palpitations (Have felt his A-fib last week.) and shortness of breath   Associated symptoms: no blood in stool, no chest pain, no headaches, no nausea and no vomiting        Home Medications Prior to Admission medications   Medication Sig Start Date End Date Taking? Authorizing Provider  albuterol  (VENTOLIN  HFA) 108 (90 Base) MCG/ACT inhaler SMARTSIG:2 Puff(s) By Mouth Every 4 Hours PRN    [provider]  amLODipine  (NORVASC ) 5 MG tablet TAKE 1 TABLET(5 MG) BY MOUTH DAILY 11/27/22   Shlomo Wilbert SAUNDERS, MD  apixaban  (ELIQUIS ) 5 MG TABS tablet Take 1 tablet (5 mg total) by mouth 2 (two) times daily. 12/23/22   Shlomo Wilbert SAUNDERS, MD  aspirin  EC 81 MG tablet Take 81 mg by mouth daily. Swallow whole.    [provider]  atorvastatin  (LIPITOR) 20 MG tablet TAKE 1 TABLET(20 MG) BY MOUTH DAILY 10/28/22   Shlomo Wilbert SAUNDERS, MD  Coenzyme Q10 (CO Q-10) 100 MG CAPS Take 1 capsule by mouth every morning.    [provider]  cyanocobalamin  (VITAMIN B12) 1000 MCG tablet Take 1,000 mcg by mouth daily.    [provider]  dronedarone  (MULTAQ ) 400 MG tablet Take 1 tablet (400 mg total) by mouth 2 (two) times daily with a meal. 11/04/22   Fenton, Clint R, PA  EPINEPHrine (EPIPEN IJ) Inject as directed as needed (bee stings).     [provider]  Fluticasone -Umeclidin-Vilant 100-62.5-25 MCG/ACT AEPB Inhale 1 puff into the lungs at bedtime. Not sure the dosage    [provider]  HYDROcodone -acetaminophen  (NORCO/VICODIN) 5-325 MG tablet Take 1-2 tablets by mouth every 6 (six) hours as needed. 05/11/22   Lynwood Lenis, PA-C  metoprolol  tartrate (LOPRESSOR ) 25 MG tablet TAKE 1/2 TABLET BY MOUTH TWICE DAILY 11/27/22   Shlomo Wilbert SAUNDERS, MD  mupirocin   ointment (BACTROBAN ) 2 % Apply 1 Application topically 2 (two) times daily. 10/16/22   Sikora, Rebecca, DPM  nitroGLYCERIN  (NITROSTAT ) 0.4 MG SL tablet Place 1 tablet (0.4 mg total) under the tongue every 5 (five) minutes as needed for chest pain. 11/22/21   Shlomo Wilbert SAUNDERS, MD  omeprazole (PRILOSEC) 20 MG capsule Take 20 mg by mouth as needed.    [provider]      Allergies    Bee venom, Amoxicillin-pot clavulanate, Hydromorphone , and Tramadol     Review of Systems   Review of Systems  Constitutional:  Positive for fatigue. Negative for chills and fever.  Respiratory:  Positive for shortness of breath. Negative for  cough.   Cardiovascular:  Positive for palpitations (Have felt his A-fib last week.) and leg swelling. Negative for chest pain.  Gastrointestinal:  Negative for abdominal pain, blood in stool, nausea and vomiting.  Genitourinary:  Negative for dysuria and hematuria.  Neurological:  Positive for dizziness and light-headedness. Negative for headaches.    Physical Exam Updated Vital Signs BP 128/60   Pulse 76   Temp 98.8 F (37.1 C)   Resp 20   Ht 5' 8 (1.727 m)   Wt 80.3 kg   SpO2 98%   BMI 26.91 kg/m  Physical Exam Vitals and nursing note reviewed. Exam conducted with a chaperone present [place], NT).  Constitutional:      General: He is not in acute distress.    Appearance: He is not toxic-appearing.     Comments: Appears pale  Eyes:     General: No scleral icterus.    Comments: Pale conjunctiva  Cardiovascular:     Rate and Rhythm: Normal rate.  Pulmonary:     Effort: Pulmonary effort is normal. No respiratory distress.     Comments: Diminished at the bases but speaking in full sentences with ease.  Satting well room air without increased work of breathing Abdominal:     Tenderness: There is no abdominal tenderness. There is no guarding or rebound.  Genitourinary:    Comments: Digital rectal exam performed.  Brown stool present.  No gross red blood and melena present.  Hemoccult obtained and was negative. Musculoskeletal:     Right lower leg: Edema present.     Left lower leg: Edema present.     Comments: 2+ pitting edema extending to the knees.  Pliable compartments.  Skin:    General: Skin is warm and dry.  Neurological:     General: No focal deficit present.     Mental Status: He is alert.     ED Results / Procedures / Treatments   Labs (all labs ordered are listed, but only abnormal results are displayed) Labs Reviewed  CBC WITH DIFFERENTIAL/PLATELET - Abnormal; Notable for the following components:      Result Value   RBC 2.40 (*)    Hemoglobin 5.8 (*)     HCT 19.2 (*)    MCH 24.2 (*)    RDW 16.6 (*)    All other components within normal limits  COMPREHENSIVE METABOLIC PANEL - Abnormal; Notable for the following components:   Sodium 134 (*)    CO2 21 (*)    Glucose, Bld 128 (*)    Calcium  7.8 (*)    Total Protein 5.6 (*)    Albumin 2.9 (*)    All other components within normal limits  I-STAT CHEM 8, ED - Abnormal; Notable for the following components:   Sodium 134 (*)    Glucose,  Bld 123 (*)    Calcium , Ion 1.01 (*)    TCO2 20 (*)    Hemoglobin 6.5 (*)    HCT 19.0 (*)    All other components within normal limits  PROTIME-INR  OCCULT BLOOD X 1 CARD TO LAB, STOOL  TYPE AND SCREEN    EKG None  Radiology No results found.  Procedures .Critical Care  Performed by: Bernis Ernst, PA-C Authorized by: Bernis Ernst, PA-C   Critical care provider statement:    Critical care time (minutes):  45   Critical care was necessary to treat or prevent imminent or life-threatening deterioration of the following conditions: anemia.   Critical care was time spent personally by me on the following activities:  Development of treatment plan with patient or surrogate, discussions with consultants, evaluation of patient's response to treatment, examination of patient, ordering and review of laboratory studies, ordering and review of radiographic studies, ordering and performing treatments and interventions, pulse oximetry, re-evaluation of patient's condition, review of old charts and obtaining history from patient or surrogate   Care discussed with: admitting provider   Comments:     Hemoglobin of 5.8 requiring blood transfusion and admission.     Medications Ordered in ED Medications - No data to display  ED Course/ Medical Decision Making/ A&P    Medical Decision Making Amount and/or Complexity of Data Reviewed Labs: ordered. Radiology: ordered.  Risk Prescription drug management. Decision regarding hospitalization.   77 y.o.  male presents to the ER for evaluation of lab abnormality. Differential diagnosis includes but is not limited to anemia, lab error, GI bleed. Vital signs unremarkable. Physical exam as noted above.   On previous chart evaluation, patient called his cardiologist given his syncopal episode.  He is currently wearing a patch as well as had an echo performed that was overall unremarkable.  I independently reviewed and interpreted the patient's labs.  Troponin at 9.  Type and screen performed.  Hemoccult negative.  BNP slightly elevated 273.5.  CBC shows a hemoglobin of 5.8 with hematocrit of 19.2.  No leukocytosis.  INR within normal limits.  CMP shows sodium of 134.  Bicarb of 21 with a glucose of 128.  Mildly decreased calcium , total protein, and albumin.  Otherwise, no electrolyte or LFT abnormality.  CT imaging shows no evidence of acute intracranial abnormality. Per radiologist's interpretation.   CXR pending.   EKG reviewed and interpreted by my attending and read as no significant change since last tracing.  I discussed with patient the risk and benefits of blood transfusion.  He verbally consented for blood administration. Two units of blood ordered.  Patient is likely experiencing some lightheadedness/shortness of breath given his profound anemia.  Given the patient's new anemia, will need admission for blood transfusion and further workup. Dr. Marcene to admit.   Portions of this report may have been transcribed using voice recognition software. Every effort was made to ensure accuracy; however, inadvertent computerized transcription errors may be present.   Final Clinical Impression(s) / ED Diagnoses Final diagnoses:  Anemia, unspecified type    Rx / DC Orders ED Discharge Orders     None         Bernis Ernst, DEVONNA 03/25/23 2010    Dean Clarity, MD 03/26/23 2139

## 2023-03-25 NOTE — ED Provider Triage Note (Signed)
 Emergency Medicine Provider Triage Evaluation Note  Julian Weaver , a 77 y.o. male  was evaluated in triage.  Pt complains of dizzinessx2 weeks. Fell 1 week ago and hit head, passing out. Went to PCP today and was told he had low hemoglobin and to go to ER. Endorses SOB  Review of Systems  Positive: SOB, dizziness Negative: N/v  Physical Exam  BP 128/60   Pulse 76   Temp 98.8 F (37.1 C)   Resp 20   Ht 5' 8 (1.727 m)   Wt 80.3 kg   SpO2 98%   BMI 26.91 kg/m  Gen:   Awake, no distress   Resp:  Normal effort  MSK:   Moves extremities without difficulty  Other:    Medical Decision Making  Medically screening exam initiated at 4:04 PM.  Appropriate orders placed.  Julian Weaver was informed that the remainder of the evaluation will be completed by another provider, this initial triage assessment does not replace that evaluation, and the importance of remaining in the ED until their evaluation is complete.     Julian Weaver, Julian Weaver 03/25/23 1605

## 2023-03-25 NOTE — H&P (Signed)
 History and Physical      Julian Weaver FMW:[id number] DOB: 06/21/1945 DOA: 03/25/2023; DOS: 03/25/2023  PCP: Chrystal Lamarr RAMAN, MD  Patient coming from: home   I have personally briefly reviewed patient's old medical records in [facility]  Chief Complaint: Low hemoglobin per outpatient labs  HPI: Julian Weaver is a 77 y.o. male with medical history significant for paroxysmal atrial fibrillation chronically anticoagulated on Eliquis , essential pretension, hyperlipidemia, COPD, who is admitted to [facility] on 03/25/2023 with subacute symptomatic anemia after presenting from home to [facility] ED for further evaluation of low hemoglobin identified on outpatient labs.   The patient reports that he has been feeling mildly short of breath exertion over the last 2 to 3 weeks.  Denies any associated orthopnea, PND, or peripheral edema.  Not associate with any chest pain, wheezing, hemoptysis.  He also denies any associated subjective fever, chills, rigors, generalized myalgias nor any recent cough.  Over that time, he is also noted some dizziness, lightheadedness when attempting to rise from a seated to standing position.  1 week ago, when attempting to rise from a seated to standing position after a meal, the patient reports that he once again experienced dizziness, lightheadedness, and the subjective sensation of impending loss of consciousness, before formally losing consciousness and falling to the floor below.  He does not believe that he hit his head as a component of this fall.  Denies any ensuing syncopal episodes, but reports that he does continue to experience some dizziness lightheadedness when rising from a seated to standing position.  The above episode was not associate with any acute focal weakness or any acute focal numbness/paresthesias.  It was also not associate with any tongue biting or loss of bowel/bladder function.  He has a history of paroxysmal  atrial fibrillation for which she is chronically anticoagulated on Eliquis .  He denies any recent melena, hematochezia, or any recent abdominal discomfort or hematemesis.  No recent increase hematuria or hemoptysis.  Per chart review, his hemoglobin in December 2021 was 15.5, followed by 11.5 in August 2024.  In the setting of the aforementioned to 3 weeks of shortness of breath with postural dizziness, he presented to his PCP earlier today, at which time ensuing labs revealed a low hemoglobin value, prompting recommendation for PCP for the patient to present to the emergency department for further evaluation management thereof.  He notes that he has required PRBC transfusion once in his life.  He notes that this occurred when he was serving in [employer] and was diagnosed with malaria.  4 days ago, he underwent echocardiogram as an outpatient, with results notable for LVEF 60 to 65%, no evidence of focal Measurin  values, indeterminate diastolic parameters, and trivial mitral regurgitation as well as trivial aortic regurgitation.    ED Course:  Vital signs in the ED were notable for the following: Afebrile; heart rates in the 70s to 1 teens; systolic blood pressures in the 120s 130s; respiratory rate 14-20, oxygen saturation 98 to 100% on room air.  Labs were notable for the following: CMP was notable for the following BUN 15, creatinine 1.24, glucose 128, calcium  adjusted for mild hypoalbuminemia noted to be 8.6, avidin 2.9.  Otherwise, liver enzymes are within normal limits.  High sensitive troponin I initially 9, 3 value trending down to 8.  BNP 273.  CBC notable for white cell count 8100, hemoglobin 5.8 associated with normocytic/Norocarp properties, platelet count 180.  DRE was performed by EDP  revealing brown-colored stool, there was fecal occult blood negative.  Per my interpretation, EKG in ED demonstrated the following: Normal sinus rhythm with right bundle branch block, heart rate 73,  no evidence of T wave or ST changes, including no evidence of ST elevation.  Imaging in the ED, per corresponding formal radiology read, was notable for the following: 1 view chest x-ray showed no evidence of acute cardiopulmonary process, including no evidence of infiltrate, edema, effusion, or pneumothorax.  Noncontrast CT head showed no evidence of acute intracranial process, including no evidence of intracranial pressure or evidence of acute infarct.  While in the ED, the following were administered: Initiation of transfusion of 2 units PRBC.  Subsequently, the patient was admitted for further evaluation management of subacute versus subacute on chronic symptomatic anemia.      Review of Systems: As per HPI otherwise 10 point review of systems negative.   Past Medical History:  Diagnosis Date   Allergic rhinitis    Aortic atherosclerosis (HCC)    Arthritis    Carotid artery stenosis    1-39% bilateral dopplers 11/2022   Coronary artery disease 04/13/2008   Drug eluting stent in RCA // Myoview  04/2019: EF 52, normal perfusion; low risk   Hypercholesteremia    Hypertension    PAF (paroxysmal atrial fibrillation) (HCC)    RBBB    Renal disorder    Tobacco abuse     Past Surgical History:  Procedure Laterality Date   CAROTID STENT  2010   SHOULDER ACROMIOPLASTY  2010    Social History:  reports that he has been smoking cigarettes. He started smoking about 40 years ago. He has a 56.4 pack-year smoking history. He has never used smokeless tobacco. He reports current alcohol use of about 1.0 standard drink of alcohol per week. He reports that he does not use drugs.   Allergies  Allergen Reactions   Bee Venom Swelling   Amoxicillin-Pot Clavulanate Nausea And Vomiting    Severe nausea and vomiting   Hydromorphone      vomiting   Tramadol  Nausea Only    Family History  Problem Relation Age of Onset   Stroke Mother    Stroke Father     Family history reviewed and not  pertinent    Prior to Admission medications   Medication Sig Start Date End Date Taking? Authorizing Provider  albuterol  (VENTOLIN  HFA) 108 (90 Base) MCG/ACT inhaler Inhale 2 puffs into the lungs every 4 (four) hours as needed for shortness of breath.   Yes [provider]  amLODipine  (NORVASC ) 5 MG tablet TAKE 1 TABLET(5 MG) BY MOUTH DAILY Patient taking differently: Take 5 mg by mouth daily. 11/27/22  Yes Turner, Wilbert SAUNDERS, MD  apixaban  (ELIQUIS ) 5 MG TABS tablet Take 1 tablet (5 mg total) by mouth 2 (two) times daily. 12/23/22  Yes Turner, Wilbert SAUNDERS, MD  aspirin  EC 81 MG tablet Take 81 mg by mouth daily. Swallow whole.   Yes [provider]  atorvastatin  (LIPITOR) 20 MG tablet TAKE 1 TABLET(20 MG) BY MOUTH DAILY Patient taking differently: Take 20 mg by mouth daily. TAKE 1 TABLET(20 MG) BY MOUTH DAILY 10/28/22  Yes Turner, Traci R, MD  Coenzyme Q10 (CO Q-10) 100 MG CAPS Take 1 capsule by mouth every morning.   Yes [provider]  cyanocobalamin  (VITAMIN B12) 1000 MCG tablet Take 1,000 mcg by mouth daily.   Yes [provider]  dronedarone  (MULTAQ ) 400 MG tablet Take 1 tablet (400 mg total) by  mouth 2 (two) times daily with a meal. 11/04/22  Yes Fenton, Clint R, PA  Fluticasone -Umeclidin-Vilant 100-62.5-25 MCG/ACT AEPB Inhale 1 puff into the lungs at bedtime. Not sure the dosage   Yes [provider]  furosemide (LASIX) 20 MG tablet Take 20 mg by mouth daily. 03/25/23  Yes [provider]  metoprolol  tartrate (LOPRESSOR ) 25 MG tablet TAKE 1/2 TABLET BY MOUTH TWICE DAILY 11/27/22  Yes Turner, Wilbert SAUNDERS, MD  EPINEPHrine (EPIPEN IJ) Inject as directed as needed (bee stings).  Patient not taking: Reported on 03/25/2023    [provider]  nitroGLYCERIN  (NITROSTAT ) 0.4 MG SL tablet Place 1 tablet (0.4 mg total) under the tongue every 5 (five) minutes as needed for chest pain. Patient not taking: Reported on 03/25/2023 11/22/21   Shlomo Wilbert SAUNDERS, MD   omeprazole (PRILOSEC) 20 MG capsule Take 20 mg by mouth as needed. Patient not taking: Reported on 03/25/2023    [provider]     Objective    Physical Exam: Vitals:   03/25/23 1845 03/25/23 1900 03/25/23 1930 03/25/23 2000  BP: 131/62 124/73 135/81 120/83  Pulse: 74 (!) 110 (!) 120 (!) 115  Resp:  18  14  Temp:    98.4 F (36.9 C)  TempSrc:    Oral  SpO2: 100% 100% 100% 100%  Weight:      Height:        General: appears to be stated age; alert, oriented Skin: warm, dry, no rash Head:  AT/[place] Mouth:  Oral mucosa membranes appear moist, normal dentition Neck: supple; trachea midline Heart:  RRR; did not appreciate any M/R/G Lungs: CTAB, did not appreciate any wheezes, rales, or rhonchi Abdomen: + BS; soft, ND, NT Vascular: 2+ pedal pulses b/l; 2+ radial pulses b/l Extremities: no peripheral edema, no muscle wasting Neuro: strength and sensation intact in upper and lower extremities b/l     Labs on Admission: I have personally reviewed following labs and imaging studies  CBC: Recent Labs  Lab 03/25/23 1610 03/25/23 1619  WBC 8.1  --   NEUTROABS 6.2  --   HGB 5.8* 6.5*  HCT 19.2* 19.0*  MCV 80.0  --   PLT 180  --    Basic Metabolic Panel: Recent Labs  Lab 03/25/23 1610 03/25/23 1619  NA 134* 134*  K 3.7 3.7  CL 101 101  CO2 21*  --   GLUCOSE 128* 123*  BUN 15 13  CREATININE 1.24 1.20  CALCIUM  7.8*  --    GFR: Estimated Creatinine Clearance: 51.5 mL/min (by C-G formula based on SCr of 1.2 mg/dL). Liver Function Tests: Recent Labs  Lab 03/25/23 1610  AST 23  ALT 20  ALKPHOS 73  BILITOT 0.5  PROT 5.6*  ALBUMIN 2.9*   No results for input(s): LIPASE, AMYLASE in the last 168 hours. No results for input(s): AMMONIA in the last 168 hours. Coagulation Profile: Recent Labs  Lab 03/25/23 1610  INR 1.2   Cardiac Enzymes: No results for input(s): CKTOTAL, CKMB, CKMBINDEX, TROPONINI in the last 168 hours. BNP (last 3  results) No results for input(s): PROBNP in the last 8760 hours. HbA1C: No results for input(s): HGBA1C in the last 72 hours. CBG: No results for input(s): GLUCAP in the last 168 hours. Lipid Profile: No results for input(s): CHOL, HDL, LDLCALC, TRIG, CHOLHDL, LDLDIRECT in the last 72 hours. Thyroid  Function Tests: No results for input(s): TSH, T4TOTAL, FREET4, T3FREE, THYROIDAB in the last 72 hours. Anemia Panel: No results  for input(s): VITAMINB12, FOLATE, FERRITIN, TIBC, IRON, RETICCTPCT in the last 72 hours. Urine analysis:    Component Value Date/Time   COLORURINE YELLOW 11/23/2014 1747   APPEARANCEUR CLOUDY (A) 11/23/2014 1747   LABSPEC 1.025 11/23/2014 1747   PHURINE 6.5 11/23/2014 1747   GLUCOSEU NEGATIVE 11/23/2014 1747   HGBUR TRACE (A) 11/23/2014 1747   BILIRUBINUR NEGATIVE 11/23/2014 1747   KETONESUR NEGATIVE 11/23/2014 1747   PROTEINUR 30 (A) 11/23/2014 1747   UROBILINOGEN 1.0 11/23/2014 1747   NITRITE NEGATIVE 11/23/2014 1747   LEUKOCYTESUR NEGATIVE 11/23/2014 1747    Radiological Exams on Admission: CT Head Wo Contrast Result Date: 03/25/2023 CLINICAL DATA:  Head trauma, minor (Age >= 65y) FOTx1 week ago. EXAM: CT HEAD WITHOUT CONTRAST TECHNIQUE: Contiguous axial images were obtained from the base of the skull through the vertex without intravenous contrast. RADIATION DOSE REDUCTION: This exam was performed according to the departmental dose-optimization program which includes automated exposure control, adjustment of the mA and/or kV according to patient size and/or use of iterative reconstruction technique. COMPARISON:  None Available. FINDINGS: Brain: No evidence of acute infarction, hemorrhage, hydrocephalus, extra-axial collection or mass lesion/mass effect. Vascular: No hyperdense vessel. Skull: No acute fracture. Sinuses/Orbits: Clear sinuses.  No acute orbital findings. Other: No mastoid effusions. IMPRESSION: No evidence  of acute intracranial abnormality. Electronically Signed   By: Gilmore GORMAN Molt M.D.   On: 03/25/2023 19:03      Assessment/Plan   Principal Problem:   Symptomatic anemia Active Problems:   Essential hypertension   Tobacco abuse   Paroxysmal atrial fibrillation (HCC)   Syncope   Postural dizziness   Hyperlipidemia   History of COPD    #) Subacute symptomatic anemia: In the setting of 2 to 3 weeks of dyspnea on exertion, with new postural dizziness over that timeframe, hemoglobin is found to be 5.8, down from 11.5 in August 2024 and down from 15.5 in December 2021.  Given the gap in hemoglobin data points particularly from August 2024 to December 2021, timeframe for the patient's anemia appears to be either subacute versus subacute on chronic.  Clinically, it does not appear to be acute, given the relatively limited nature of his symptoms with hemoglobin of 5.8, will also noting his hemodynamic stability in spite of a hemoglobin of this value.  Unclear source for his normocytic/normochromic anemia at this time.  No evidence of any recent blood loss, the patient denies any recent melena, hematochezia, We will DRE reveals brown-colored stool with fecal occult blood negative finding.  However, he is chronically anticoagulated on Eliquis , increasing his risk for bleed.  No preliminary evidence of hemolytic process, with nonelevated total bilirubin, but will also check peripheral smear, as outlined below.  Will expand laboratory evaluation of his anemia, as further outlined below, and proceed with the current orders for transfusion of 2 units PRBC, as further outlined below.  Depending upon the timing of his most recent colonoscopy, he may ultimately benefit from a outpatient GI referral for updated colonoscopy.  Plan: Continue with transfusion of 2 units PRBC, as above.  Every 4 hour H&H's ordered through 9 AM on 03/26/2023 for repeat CBC in the morning.  Add on the following to pretransfusion  specimen: Iron studies, B12 level, folic acid level reticulocyte count, PTT, INR, technologist were reviewed.  Hold home Lasix for now.  Hold next dose of amlodipine  as well.  Monitor on telemetry.                #) Syncope:  1 episode of syncope that occurred 1 week ago when rising from a seated to standing position associated with prodrome, which appears consistent with orthostatic hypotension as a consequence of relative decline in intravascular volume in the setting of his subacute versus subacute on chronic anemia, as further outlined above.  Clinically, no evidence of contributory acute stroke or contributory seizures.  Additionally, ACS appears less likely, given no recent chest pain, will also noting presenting EKG shows no evidence of acute ischemic changes.  Will proceed with further evaluation management of his presenting subacute symptomatic anemia, including PRBC transfusion, with plan to check orthostatic vital signs to further assess.  It is noted that he just underwent echocardiogram 4 days ago.  Consequently, we will refrain from pursuing updated echo at this time.  Plan: Further evaluation management of subacute versus subacute on chronic anemia, including PBC transfusion, as above.  Monitor strict I's and O's and daily weights.  Fall precautions.  Monitor on telemetry.  Check orthostatic vital signs.  Repeat CMP, CBC in the morning.  Hold home Lasix and amlodipine  for now.                   #) Paroxysmal atrial fibrillation: Documented history of such. In setting of CHA2DS2-VASc score of 3, there is an indication for chronic anticoagulation for thromboembolic prophylaxis. Consistent with this, patient is chronically anticoagulated on Eliquis . Home AV nodal blocking regimen: Lopressor .  Additionally, he is on rhythm control strategy as an outpatient, on Multaq . most recent echocardiogram was performed on 03/21/2023, notable for LVEF 66 5%, determine diastolic  parameters, as well as trivial mitral regurgitation and trivial aortic regurgitation.. Presenting EKG demonstrates sinus rhythm without overt evidence of acute ischemic changes.  Mellitus present with subacute anemia, in the absence of any evidence of active or recent blood loss, will initially plan to resume outpatient Eliquis  tomorrow.   Plan: monitor strict I's & O's and daily weights. CMP/CBC in AM. Check serum mag level. Continue home AV nodal blocking regimen.  Resume home Multaq  as well as Eliquis .  Monitor on telemetry.                   #) Essential Hypertension: documented h/o such, with outpatient antihypertensive regimen including Norvasc , Lasix, Lopressor .  SBP's in the ED today: 120s to 130s mmHg. in the setting of his presenting intravascular depletion stemming from subacute anemia, will hold home Lasix for now as well as hold him Norvasc  for now.   Plan: Close monitoring of subsequent BP via routine VS. resume home beta-blocker.  Hold home Norvasc  and Lasix for now, as above.  Monitor strict I's and O's and daily weights.                   #) Hyperlipidemia: documented h/o such. On atorvastatin  as outpatient.   Plan: continue home statin.                 #) COPD: Documented history thereof, without clinical evidence of acute exacerbation at this time.  Outpatient respiratory regimen includes the following: Trelegy, as needed albuterol  inhaler.   Plan: cont outpatient Trelegy. Prn albuterol  nebulizer. Check CMP and serum magnesium level in the AM.  Check Phos level.                   #) Chronic tobacco abuse: Patient conveys that they are a current smoker, having smoked half pack to 1 pack/day over the last 40 years.  Notable  in the context of the patient's underlying history of COPD.  Plan: Counseled the patient for 3-5 minutes on the importance of complete smoking discontinuation.  Order placed for prn nicotine  patch  for use during this hospitalization.     DVT prophylaxis: SCD's + continuation of outpatient Eliquis  Code Status: Full code Family Communication: none Disposition Plan: Per Rounding Team Consults called: none;  Admission status: Observation     I SPENT GREATER THAN 75  MINUTES IN CLINICAL CARE TIME/MEDICAL DECISION-MAKING IN COMPLETING THIS ADMISSION.      Eva NOVAK Iysha Mishkin DO [facility]  From 7PM - 7AM   03/25/2023, 8:17 PM

## 2023-03-26 DIAGNOSIS — F17210 Nicotine dependence, cigarettes, uncomplicated: Secondary | ICD-10-CM | POA: Diagnosis present

## 2023-03-26 DIAGNOSIS — D509 Iron deficiency anemia, unspecified: Secondary | ICD-10-CM | POA: Diagnosis present

## 2023-03-26 DIAGNOSIS — R42 Dizziness and giddiness: Secondary | ICD-10-CM

## 2023-03-26 DIAGNOSIS — K5790 Diverticulosis of intestine, part unspecified, without perforation or abscess without bleeding: Secondary | ICD-10-CM | POA: Diagnosis not present

## 2023-03-26 DIAGNOSIS — Z8701 Personal history of pneumonia (recurrent): Secondary | ICD-10-CM | POA: Diagnosis not present

## 2023-03-26 DIAGNOSIS — K5730 Diverticulosis of large intestine without perforation or abscess without bleeding: Secondary | ICD-10-CM | POA: Diagnosis present

## 2023-03-26 DIAGNOSIS — I2510 Atherosclerotic heart disease of native coronary artery without angina pectoris: Secondary | ICD-10-CM | POA: Diagnosis present

## 2023-03-26 DIAGNOSIS — Z880 Allergy status to penicillin: Secondary | ICD-10-CM | POA: Diagnosis not present

## 2023-03-26 DIAGNOSIS — R55 Syncope and collapse: Secondary | ICD-10-CM | POA: Diagnosis present

## 2023-03-26 DIAGNOSIS — I951 Orthostatic hypotension: Secondary | ICD-10-CM | POA: Diagnosis present

## 2023-03-26 DIAGNOSIS — D649 Anemia, unspecified: Secondary | ICD-10-CM | POA: Diagnosis present

## 2023-03-26 DIAGNOSIS — Z7901 Long term (current) use of anticoagulants: Secondary | ICD-10-CM | POA: Diagnosis not present

## 2023-03-26 DIAGNOSIS — Z91030 Bee allergy status: Secondary | ICD-10-CM | POA: Diagnosis not present

## 2023-03-26 DIAGNOSIS — K640 First degree hemorrhoids: Secondary | ICD-10-CM | POA: Diagnosis present

## 2023-03-26 DIAGNOSIS — Z8613 Personal history of malaria: Secondary | ICD-10-CM | POA: Diagnosis not present

## 2023-03-26 DIAGNOSIS — Z8709 Personal history of other diseases of the respiratory system: Secondary | ICD-10-CM

## 2023-03-26 DIAGNOSIS — E876 Hypokalemia: Secondary | ICD-10-CM | POA: Diagnosis present

## 2023-03-26 DIAGNOSIS — I4510 Unspecified right bundle-branch block: Secondary | ICD-10-CM | POA: Diagnosis present

## 2023-03-26 DIAGNOSIS — J440 Chronic obstructive pulmonary disease with acute lower respiratory infection: Secondary | ICD-10-CM | POA: Diagnosis present

## 2023-03-26 DIAGNOSIS — Z955 Presence of coronary angioplasty implant and graft: Secondary | ICD-10-CM | POA: Diagnosis not present

## 2023-03-26 DIAGNOSIS — I700 Atherosclerosis of aorta: Secondary | ICD-10-CM | POA: Diagnosis present

## 2023-03-26 DIAGNOSIS — I10 Essential (primary) hypertension: Secondary | ICD-10-CM | POA: Diagnosis present

## 2023-03-26 DIAGNOSIS — K922 Gastrointestinal hemorrhage, unspecified: Secondary | ICD-10-CM | POA: Diagnosis present

## 2023-03-26 DIAGNOSIS — E871 Hypo-osmolality and hyponatremia: Secondary | ICD-10-CM | POA: Diagnosis present

## 2023-03-26 DIAGNOSIS — E785 Hyperlipidemia, unspecified: Secondary | ICD-10-CM | POA: Diagnosis present

## 2023-03-26 DIAGNOSIS — E7800 Pure hypercholesterolemia, unspecified: Secondary | ICD-10-CM | POA: Diagnosis present

## 2023-03-26 DIAGNOSIS — D62 Acute posthemorrhagic anemia: Secondary | ICD-10-CM | POA: Diagnosis present

## 2023-03-26 DIAGNOSIS — I480 Paroxysmal atrial fibrillation: Secondary | ICD-10-CM | POA: Diagnosis present

## 2023-03-26 DIAGNOSIS — R195 Other fecal abnormalities: Secondary | ICD-10-CM | POA: Diagnosis not present

## 2023-03-26 DIAGNOSIS — Z885 Allergy status to narcotic agent status: Secondary | ICD-10-CM | POA: Diagnosis not present

## 2023-03-26 DIAGNOSIS — M1990 Unspecified osteoarthritis, unspecified site: Secondary | ICD-10-CM | POA: Diagnosis present

## 2023-03-26 LAB — CBC WITH DIFFERENTIAL/PLATELET
Abs Immature Granulocytes: 0.04 10*3/uL (ref 0.00–0.07)
Abs Immature Granulocytes: 0.03 10*3/uL (ref 0.00–0.07)
Basophils Absolute: 0 10*3/uL (ref 0.0–0.1)
Basophils Absolute: 0 10*3/uL (ref 0.0–0.1)
Basophils Relative: 0 %
Basophils Relative: 0 %
Eosinophils Absolute: 0.2 10*3/uL (ref 0.0–0.5)
Eosinophils Absolute: 0.2 10*3/uL (ref 0.0–0.5)
Eosinophils Relative: 2 %
Eosinophils Relative: 2 %
HCT: 27 % — ABNORMAL LOW (ref 39.0–52.0)
HCT: 26.6 % — ABNORMAL LOW (ref 39.0–52.0)
Hemoglobin: 8.5 g/dL — ABNORMAL LOW (ref 13.0–17.0)
Hemoglobin: 8.2 g/dL — ABNORMAL LOW (ref 13.0–17.0)
Immature Granulocytes: 1 %
Immature Granulocytes: 0 %
Lymphocytes Relative: 13 %
Lymphocytes Relative: 15 %
Lymphs Abs: 1.1 10*3/uL (ref 0.7–4.0)
Lymphs Abs: 1.3 10*3/uL (ref 0.7–4.0)
MCH: 25.8 pg — ABNORMAL LOW (ref 26.0–34.0)
MCH: 25.3 pg — ABNORMAL LOW (ref 26.0–34.0)
MCHC: 31.5 g/dL (ref 30.0–36.0)
MCHC: 30.8 g/dL (ref 30.0–36.0)
MCV: 81.8 fL (ref 80.0–100.0)
MCV: 82.1 fL (ref 80.0–100.0)
Monocytes Absolute: 0.6 10*3/uL (ref 0.1–1.0)
Monocytes Absolute: 0.8 10*3/uL (ref 0.1–1.0)
Monocytes Relative: 8 %
Monocytes Relative: 10 %
Neutro Abs: 6.3 10*3/uL (ref 1.7–7.7)
Neutro Abs: 5.8 10*3/uL (ref 1.7–7.7)
Neutrophils Relative %: 76 %
Neutrophils Relative %: 73 %
Platelets: 175 10*3/uL (ref 150–400)
Platelets: 163 10*3/uL (ref 150–400)
RBC: 3.3 MIL/uL — ABNORMAL LOW (ref 4.22–5.81)
RBC: 3.24 MIL/uL — ABNORMAL LOW (ref 4.22–5.81)
RDW: 16.5 % — ABNORMAL HIGH (ref 11.5–15.5)
RDW: 16.5 % — ABNORMAL HIGH (ref 11.5–15.5)
WBC: 8.2 10*3/uL (ref 4.0–10.5)
WBC: 8.1 10*3/uL (ref 4.0–10.5)
nRBC: 0 % (ref 0.0–0.2)
nRBC: 0 % (ref 0.0–0.2)

## 2023-03-26 LAB — COMPREHENSIVE METABOLIC PANEL
ALT: 19 U/L (ref 0–44)
AST: 21 U/L (ref 15–41)
Albumin: 2.7 g/dL — ABNORMAL LOW (ref 3.5–5.0)
Alkaline Phosphatase: 68 U/L (ref 38–126)
Anion gap: 8 (ref 5–15)
BUN: 11 mg/dL (ref 8–23)
CO2: 22 mmol/L (ref 22–32)
Calcium: 8.1 mg/dL — ABNORMAL LOW (ref 8.9–10.3)
Chloride: 105 mmol/L (ref 98–111)
Creatinine, Ser: 1.07 mg/dL (ref 0.61–1.24)
GFR, Estimated: >60 mL/min (ref >60)
Glucose, Bld: 145 mg/dL — ABNORMAL HIGH (ref 70–99)
Potassium: 3.4 mmol/L — ABNORMAL LOW (ref 3.5–5.1)
Sodium: 135 mmol/L (ref 135–145)
Total Bilirubin: 1.1 mg/dL (ref 0.0–1.2)
Total Protein: 5.7 g/dL — ABNORMAL LOW (ref 6.5–8.1)

## 2023-03-26 LAB — TECHNOLOGIST SMEAR REVIEW: Plt Morphology: NORMAL

## 2023-03-26 LAB — MAGNESIUM: Magnesium: 2.2 mg/dL (ref 1.7–2.4)

## 2023-03-26 LAB — PHOSPHORUS: Phosphorus: 2.6 mg/dL (ref 2.5–4.6)

## 2023-03-26 MED ORDER — POTASSIUM CHLORIDE CRYS ER 20 MEQ PO TBCR
40.0000 meq | EXTENDED_RELEASE_TABLET | ORAL | Status: AC
  Administered 2023-03-26 (×2): 40 meq via ORAL
  Filled 2023-03-26 (×2): qty 2

## 2023-03-26 MED ORDER — ATORVASTATIN CALCIUM 10 MG PO TABS
20.0000 mg | ORAL_TABLET | Freq: Every day | ORAL | Status: DC
  Administered 2023-03-26 – 2023-03-27 (×2): 20 mg via ORAL
  Filled 2023-03-26: qty 2
  Filled 2023-03-26: qty 1

## 2023-03-26 MED ORDER — ALBUTEROL SULFATE (2.5 MG/3ML) 0.083% IN NEBU: 2.5000 mg | INHALATION_SOLUTION | RESPIRATORY_TRACT | Status: DC | PRN

## 2023-03-26 MED ORDER — FLUTICASONE FUROATE-VILANTEROL 100-25 MCG/ACT IN AEPB
1.0000 | INHALATION_SPRAY | Freq: Every day | RESPIRATORY_TRACT | Status: DC
  Administered 2023-03-26 – 2023-03-27 (×2): 1 via RESPIRATORY_TRACT
  Filled 2023-03-26: qty 28

## 2023-03-26 MED ORDER — APIXABAN 5 MG PO TABS: 5.0000 mg | ORAL_TABLET | Freq: Two times a day (BID) | ORAL | Status: DC

## 2023-03-26 MED ORDER — DRONEDARONE HCL 400 MG PO TABS
400.0000 mg | ORAL_TABLET | Freq: Two times a day (BID) | ORAL | Status: DC
Start: 2023-03-26 — End: 2023-03-28
  Administered 2023-03-26 – 2023-03-27 (×4): 400 mg via ORAL
  Filled 2023-03-26 (×6): qty 1

## 2023-03-26 MED ORDER — UMECLIDINIUM BROMIDE 62.5 MCG/ACT IN AEPB
1.0000 | INHALATION_SPRAY | Freq: Every day | RESPIRATORY_TRACT | Status: DC
  Administered 2023-03-26 – 2023-03-27 (×2): 1 via RESPIRATORY_TRACT
  Filled 2023-03-26: qty 7

## 2023-03-26 MED ORDER — PANTOPRAZOLE SODIUM 40 MG IV SOLR
40.0000 mg | Freq: Two times a day (BID) | INTRAVENOUS | Status: DC
  Administered 2023-03-26 – 2023-03-27 (×4): 40 mg via INTRAVENOUS
  Filled 2023-03-26 (×4): qty 10

## 2023-03-26 MED ORDER — NICOTINE 14 MG/24HR TD PT24: 14.0000 mg | MEDICATED_PATCH | Freq: Every day | TRANSDERMAL | Status: DC | PRN

## 2023-03-26 NOTE — Progress Notes (Signed)
 PROGRESS NOTE    Arda Keadle  FMW:[id number] DOB: 1945/10/09 DOA: 03/25/2023 PCP: Chrystal Lamarr RAMAN, MD   Brief Narrative:  Julian Weaver is a 78 y.o. male with medical history significant for paroxysmal atrial fibrillation chronically anticoagulated on Eliquis , essential pretension, hyperlipidemia, COPD, who is admitted to [facility] on 03/25/2023 with subacute symptomatic anemia after presenting from home to [facility] ED for further evaluation of low hemoglobin identified on outpatient labs.   Assessment & Plan:   Principal Problem:   Symptomatic anemia Active Problems:   Essential hypertension   Tobacco abuse   Paroxysmal atrial fibrillation (HCC)   Syncope   Postural dizziness   Hyperlipidemia   History of COPD  Acute vs Subacute symptomatic anemia: Symptomatic with dyspnea for 2 to 3 weeks.  Hemoglobin 5.8 upon arrival, status post 2 unit of PRBC transfusion, hemoglobin 8.5 now.  FOBT negative in the ED.  No history of hematemesis, melena or hematochezia.  However, 1 negative FOBT does not rule out GI bleed and for that reason, I will check FOBT x 2 again and in the meantime meantime will hold Eliquis  (discussed this with the patient).  Monitor H&H every 12 hours and transfuse to keep hemoglobin over 7.  Since I highly suspect possible upper GI bleed, I will start on Protonix  twice daily empirically.  Iron studies indicate iron deficiency anemia.   Paroxysmal atrial fibrillation: CHA2DS2-VASc score of 3 however currently with presumed GI bleed, rates controlled on current home dose of Multaq  but hold Eliquis  and monitor.   Essential Hypertension: Blood pressure very well-controlled on home metoprolol .  Amlodipine  on hold.    Hypokalemia: Replenished.  Hyponatremia: Resolved.   Hyperlipidemia: Continue atorvastatin .   COPD: Stable, no wheezes.  Cont outpatient Trelegy. Prn albuterol  nebulizer.    Chronic tobacco abuse: Patient conveys that they  are a current smoker, having smoked half pack to 1 pack/day over the last 40 years.  I have discussed tobacco cessation with the patient.  I have counseled the patient regarding the negative impacts of continued tobacco use including but not limited to lung cancer, COPD, and cardiovascular disease.  I have discussed alternatives to tobacco and modalities that may help facilitate tobacco cessation including but not limited to biofeedback, hypnosis, and medications.  Total time spent with tobacco counseling was 5 minutes.  DVT prophylaxis: SCDs Start: 03/25/23 2006   Code Status: Full Code  Family Communication:  None present at bedside.  Plan of care discussed with patient in length and he/she verbalized understanding and agreed with it.  Status is: Observation The patient will require care spanning > 2 midnights and should be moved to inpatient because: Needs more observation in the hospital   Estimated body mass index is 26.91 kg/m as calculated from the following:   Height as of this encounter: 5' 8 (1.727 m).   Weight as of this encounter: 80.3 kg.    Nutritional Assessment: Body mass index is 26.91 kg/m.SABRA Seen by dietician.  I agree with the assessment and plan as outlined below: Nutrition Status:        . Skin Assessment: I have examined the patient's skin and I agree with the wound assessment as performed by the wound care RN as outlined below:    Consultants:  None  Procedures:  None  Antimicrobials:  Anti-infectives (From admission, onward)    None         Subjective: Patient seen and examined the ED, he has no complaints.  He is eating breakfast.  I did confirm with him and he denies any melena, hematochezia or hematemesis.  Objective: Vitals:   03/26/23 0715 03/26/23 0734 03/26/23 0745 03/26/23 0800  BP: (!) 102/52  122/65 121/60  Pulse: 68  68 62  Resp: 19  15 15   Temp:  98.2 F (36.8 C)    TempSrc:  Oral    SpO2: 98%  97% 99%  Weight:       Height:        Intake/Output Summary (Last 24 hours) at 03/26/2023 0815 Last data filed at 03/26/2023 0345 Gross per 24 hour  Intake 565 ml  Output --  Net 565 ml   Filed Weights   03/25/23 1601  Weight: 80.3 kg    Examination:  General exam: Appears calm and comfortable  Respiratory system: Clear to auscultation. Respiratory effort normal. Cardiovascular system: S1 & S2 heard, RRR. No JVD, murmurs, rubs, gallops or clicks. No pedal edema. Gastrointestinal system: Abdomen is nondistended, soft and nontender. No organomegaly or masses felt. Normal bowel sounds heard. Central nervous system: Alert and oriented. No focal neurological deficits. Extremities: Symmetric 5 x 5 power. Skin: No rashes, lesions or ulcers Psychiatry: Judgement and insight appear normal. Mood & affect appropriate.    Data Reviewed: I have personally reviewed following labs and imaging studies  CBC: Recent Labs  Lab 03/25/23 1610 03/25/23 1619 03/26/23 0530  WBC 8.1  --  8.2  NEUTROABS 6.2  --  6.3  HGB 5.8* 6.5* 8.5*  HCT 19.2* 19.0* 27.0*  MCV 80.0  --  81.8  PLT 180  --  175   Basic Metabolic Panel: Recent Labs  Lab 03/25/23 1610 03/25/23 1619 03/25/23 2036 03/26/23 0530  NA 134* 134*  --  135  K 3.7 3.7  --  3.4*  CL 101 101  --  105  CO2 21*  --   --  22  GLUCOSE 128* 123*  --  145*  BUN 15 13  --  11  CREATININE 1.24 1.20  --  1.07  CALCIUM  7.8*  --   --  8.1*  MG  --   --  1.8 2.2   GFR: Estimated Creatinine Clearance: 57.7 mL/min (by C-G formula based on SCr of 1.07 mg/dL). Liver Function Tests: Recent Labs  Lab 03/25/23 1610 03/26/23 0530  AST 23 21  ALT 20 19  ALKPHOS 73 68  BILITOT 0.5 1.1  PROT 5.6* 5.7*  ALBUMIN 2.9* 2.7*   No results for input(s): LIPASE, AMYLASE in the last 168 hours. No results for input(s): AMMONIA in the last 168 hours. Coagulation Profile: Recent Labs  Lab 03/25/23 1610  INR 1.2   Cardiac Enzymes: No results for input(s):  CKTOTAL, CKMB, CKMBINDEX, TROPONINI in the last 168 hours. BNP (last 3 results) No results for input(s): PROBNP in the last 8760 hours. HbA1C: No results for input(s): HGBA1C in the last 72 hours. CBG: No results for input(s): GLUCAP in the last 168 hours. Lipid Profile: No results for input(s): CHOL, HDL, LDLCALC, TRIG, CHOLHDL, LDLDIRECT in the last 72 hours. Thyroid  Function Tests: No results for input(s): TSH, T4TOTAL, FREET4, T3FREE, THYROIDAB in the last 72 hours. Anemia Panel: Recent Labs    03/25/23 2036  VITAMINB12 831  FOLATE 25.0  FERRITIN 6*  TIBC 449  IRON 16*  RETICCTPCT 2.6   Sepsis Labs: No results for input(s): PROCALCITON, LATICACIDVEN in the last 168 hours.  No results found for this or any previous visit (from  the past 240 hours).   Radiology Studies: DG Chest Portable 1 View Result Date: 03/25/2023 CLINICAL DATA:  Shortness of breath. EXAM: PORTABLE CHEST 1 VIEW COMPARISON:  CT 5 days ago, radiograph 10 days ago FINDINGS: Mild hyperinflation with bronchial thickening. No focal airspace disease. The heart is normal in size. Mediastinal contours are normal. No pneumothorax, large pleural effusion, or pulmonary edema. Presumed monitoring device projecting over the left chest. IMPRESSION: Mild hyperinflation with bronchial thickening. No focal airspace disease. Electronically Signed   By: Andrea Gasman M.D.   On: 03/25/2023 21:10   CT Head Wo Contrast Result Date: 03/25/2023 CLINICAL DATA:  Head trauma, minor (Age >= 65y) FOTx1 week ago. EXAM: CT HEAD WITHOUT CONTRAST TECHNIQUE: Contiguous axial images were obtained from the base of the skull through the vertex without intravenous contrast. RADIATION DOSE REDUCTION: This exam was performed according to the departmental dose-optimization program which includes automated exposure control, adjustment of the mA and/or kV according to patient size and/or use of iterative  reconstruction technique. COMPARISON:  None Available. FINDINGS: Brain: No evidence of acute infarction, hemorrhage, hydrocephalus, extra-axial collection or mass lesion/mass effect. Vascular: No hyperdense vessel. Skull: No acute fracture. Sinuses/Orbits: Clear sinuses.  No acute orbital findings. Other: No mastoid effusions. IMPRESSION: No evidence of acute intracranial abnormality. Electronically Signed   By: Gilmore GORMAN Molt M.D.   On: 03/25/2023 19:03    Scheduled Meds:  atorvastatin   20 mg Oral Daily   dronedarone   400 mg Oral BID WC   fluticasone  furoate-vilanterol  1 puff Inhalation Daily   And   umeclidinium bromide   1 puff Inhalation Daily   metoprolol  tartrate  12.5 mg Oral BID   potassium chloride   40 mEq Oral Q4H   triamcinolone  acetonide  10 mg Other Once   Continuous Infusions:   LOS: 0 days   Fredia Skeeter, MD [facility]  03/26/2023, 8:15 AM   *Please note that this is a verbal dictation therefore any spelling or grammatical errors are due to the Dragon Medical One system interpretation.  Please page via Amion and do not message via secure chat for urgent patient care matters. Secure chat can be used for non urgent patient care matters.  How to contact the [facility] Attending or Consulting provider 7A - 7P or covering provider during after hours 7P -7A, for this patient?  Check the care team in [facility] and look for a) attending/consulting [facility] provider listed and b) the [facility] team listed. Page or secure chat 7A-7P. Log into [email or web address] and use [place]'s universal password to access. If you do not have the password, please contact the hospital operator. Locate the [facility] provider you are looking for under [facility] and page to a number that you can be directly reached. If you still have difficulty reaching the provider, please page the [facility] (Director on Call) for the Hospitalists listed on amion for assistance.

## 2023-03-26 NOTE — Plan of Care (Signed)

## 2023-03-26 NOTE — ED Notes (Signed)
 ED TO INPATIENT HANDOFF REPORT  ED Nurse Name and Phone #: Delon RAMAN Name/Age/Gender Julian Weaver 78 y.o. male Room/Bed: 043C/043C  Code Status   Code Status: Full Code  Home/SNF/Other Home Patient oriented to: self, place, time, and situation Is this baseline? Yes   Triage Complete: Triage complete  Chief Complaint Symptomatic anemia [D64.9]  Triage Note Pt reports recent abx for pna. Seen at PCP this morning and was sent here for low HGB. Pt endorses SHOB, dizziness, and fatigue. Pt states that he had a syncopal episode about a week ago.    Allergies Allergies  Allergen Reactions   Bee Venom Swelling   Amoxicillin-Pot Clavulanate Nausea And Vomiting    Severe nausea and vomiting   Hydromorphone      vomiting   Tramadol  Nausea Only    Level of Care/Admitting Diagnosis ED Disposition     ED Disposition  Admit   Condition  --   Comment  Hospital Area: [facility] [[id number]]  Level of Care: Progressive [102]  Admit to Progressive based on following criteria: MULTISYSTEM THREATS such as stable sepsis, metabolic/electrolyte imbalance with or without encephalopathy that is responding to early treatment.  May place patient in observation at [facility] or [facility] if equivalent level of care is available:: No  Covid Evaluation: Asymptomatic - no recent exposure (last 10 days) testing not required  Diagnosis: Symptomatic anemia [8671310]  Admitting Physician: HOWERTER, JUSTIN B [[id number]]  Attending Physician: HOWERTER, JUSTIN B [[id number]]          B Medical/Surgery History Past Medical History:  Diagnosis Date   Allergic rhinitis    Aortic atherosclerosis (HCC)    Arthritis    Carotid artery stenosis    1-39% bilateral dopplers 11/2022   Coronary artery disease 04/13/2008   Drug eluting stent in RCA // Myoview  04/2019: EF 52, normal perfusion; low risk   Hypercholesteremia    Hypertension    PAF (paroxysmal atrial  fibrillation) (HCC)    RBBB    Renal disorder    Tobacco abuse    Past Surgical History:  Procedure Laterality Date   CAROTID STENT  2010   SHOULDER ACROMIOPLASTY  2010     A IV Location/Drains/Wounds Patient Lines/Drains/Airways Status     Active Line/Drains/Airways     Name Placement date Placement time Site Days   Peripheral IV 03/25/23 20 G Left;Posterior Hand 03/25/23  2006  Hand  1   Peripheral IV 03/26/23 20 G Anterior;Left Forearm 03/26/23  0030  Forearm  less than 1            Intake/Output Last 24 hours  Intake/Output Summary (Last 24 hours) at 03/26/2023 1153 Last data filed at 03/26/2023 0345 Gross per 24 hour  Intake 565 ml  Output --  Net 565 ml    Labs/Imaging Results for orders placed or performed during the hospital encounter of 03/25/23 (from the past 48 hours)  CBC with Differential     Status: Abnormal   Collection Time: 03/25/23  4:10 PM  Result Value Ref Range   WBC 8.1 4.0 - 10.5 K/uL   RBC 2.40 (L) 4.22 - 5.81 MIL/uL   Hemoglobin 5.8 (LL) 13.0 - 17.0 g/dL    Comment: REPEATED TO VERIFY THIS CRITICAL RESULT HAS VERIFIED AND BEEN CALLED TO BETHANY SHELTON,RN BY ZELDA BEECH ON 12 31 2024 AT 1657, AND HAS BEEN READ BACK.     HCT 19.2 (L) 39.0 - 52.0 %   MCV 80.0 80.0 -  100.0 fL   MCH 24.2 (L) 26.0 - 34.0 pg   MCHC 30.2 30.0 - 36.0 g/dL   RDW 83.3 (H) 88.4 - 84.4 %   Platelets 180 150 - 400 K/uL   nRBC 0.0 0.0 - 0.2 %   Neutrophils Relative % 76 %   Neutro Abs 6.2 1.7 - 7.7 K/uL   Lymphocytes Relative 12 %   Lymphs Abs 1.0 0.7 - 4.0 K/uL   Monocytes Relative 9 %   Monocytes Absolute 0.7 0.1 - 1.0 K/uL   Eosinophils Relative 2 %   Eosinophils Absolute 0.2 0.0 - 0.5 K/uL   Basophils Relative 0 %   Basophils Absolute 0.0 0.0 - 0.1 K/uL   Immature Granulocytes 1 %   Abs Immature Granulocytes 0.04 0.00 - 0.07 K/uL    Comment: Performed at [facility], [address], [place], [place] [address]  Comprehensive metabolic panel      Status: Abnormal   Collection Time: 03/25/23  4:10 PM  Result Value Ref Range   Sodium 134 (L) 135 - 145 mmol/L   Potassium 3.7 3.5 - 5.1 mmol/L   Chloride 101 98 - 111 mmol/L   CO2 21 (L) 22 - 32 mmol/L   Glucose, Bld 128 (H) 70 - 99 mg/dL    Comment: Glucose reference range applies only to samples taken after fasting for at least 8 hours.   BUN 15 8 - 23 mg/dL   Creatinine, Ser 8.75 0.61 - 1.24 mg/dL   Calcium  7.8 (L) 8.9 - 10.3 mg/dL   Total Protein 5.6 (L) 6.5 - 8.1 g/dL   Albumin 2.9 (L) 3.5 - 5.0 g/dL   AST 23 15 - 41 U/L   ALT 20 0 - 44 U/L   Alkaline Phosphatase 73 38 - 126 U/L   Total Bilirubin 0.5 0.0 - 1.2 mg/dL   GFR, Estimated >39 >39 mL/min    Comment: (NOTE) Calculated using the CKD-EPI Creatinine Equation (2021)    Anion gap 12 5 - 15    Comment: Performed at [facility], [address] [place], [place] [address]  Protime-INR     Status: None   Collection Time: 03/25/23  4:10 PM  Result Value Ref Range   Prothrombin Time 15.1 11.4 - 15.2 seconds   INR 1.2 0.8 - 1.2    Comment: (NOTE) INR goal varies based on device and disease states. Performed at [facility], [address], [place], [place] [address]   I-stat chem 8, ED (not at [facility], [facility] or [facility])     Status: Abnormal   Collection Time: 03/25/23  4:19 PM  Result Value Ref Range   Sodium 134 (L) 135 - 145 mmol/L   Potassium 3.7 3.5 - 5.1 mmol/L   Chloride 101 98 - 111 mmol/L   BUN 13 8 - 23 mg/dL   Creatinine, Ser 8.79 0.61 - 1.24 mg/dL   Glucose, Bld 876 (H) 70 - 99 mg/dL    Comment: Glucose reference range applies only to samples taken after fasting for at least 8 hours.   Calcium , Ion 1.01 (L) 1.15 - 1.40 mmol/L   TCO2 20 (L) 22 - 32 mmol/L   Hemoglobin 6.5 (LL) 13.0 - 17.0 g/dL   HCT 80.9 (L) 60.9 - 47.9 %   Comment NOTIFIED PHYSICIAN   Brain natriuretic peptide     Status: Abnormal   Collection Time: 03/25/23  5:37 PM  Result Value Ref Range   B Natriuretic  Peptide 273.5 (H) 0.0 -  100.0 pg/mL    Comment: Performed at [facility], [address], [place], [place] [address]  Troponin I (High Sensitivity)     Status: None   Collection Time: 03/25/23  5:37 PM  Result Value Ref Range   Troponin I (High Sensitivity) 9 <18 ng/L    Comment: (NOTE) Elevated high sensitivity troponin I (hsTnI) values and significant  changes across serial measurements may suggest ACS but many other  chronic and acute conditions are known to elevate hsTnI results.  Refer to the Links section for chest pain algorithms and additional  guidance. Performed at [facility], [address], [place], [place] [address]   Type and screen [facility]     Status: None (Preliminary result)   Collection Time: 03/25/23  6:00 PM  Result Value Ref Range   ABO/RH(D) O POS    Antibody Screen NEG    Sample Expiration 03/28/2023,2359    Unit Number [id number]    Blood Component Type RED CELLS,LR    Unit division 00    Status of Unit ISSUED,FINAL    Transfusion Status OK TO TRANSFUSE    Crossmatch Result      Compatible Performed at [facility], [address], [place], [place] [address]    Unit Number [id number]    Blood Component Type RED CELLS,LR    Unit division 00    Status of Unit ISSUED    Transfusion Status OK TO TRANSFUSE    Crossmatch Result Compatible   Prepare RBC (crossmatch)     Status: None   Collection Time: 03/25/23  6:00 PM  Result Value Ref Range   Order Confirmation      ORDER PROCESSED BY BLOOD BANK Performed at [facility], [address], [place], [place] [address]   ABO/Rh     Status: None   Collection Time: 03/25/23  6:20 PM  Result Value Ref Range   ABO/RH(D)      O POS Performed at [facility], [address], [place], [place] [address]   POC occult blood, ED Provider will collect     Status: None   Collection Time: 03/25/23  7:07 PM  Result Value Ref Range   Fecal Occult Bld NEGATIVE NEGATIVE  Troponin  I (High Sensitivity)     Status: None   Collection Time: 03/25/23  8:36 PM  Result Value Ref Range   Troponin I (High Sensitivity) 8 <18 ng/L    Comment: (NOTE) Elevated high sensitivity troponin I (hsTnI) values and significant  changes across serial measurements may suggest ACS but many other  chronic and acute conditions are known to elevate hsTnI results.  Refer to the Links section for chest pain algorithms and additional  guidance. Performed at [facility], [address] [place], [place] [address]   Magnesium     Status: None   Collection Time: 03/25/23  8:36 PM  Result Value Ref Range   Magnesium 1.8 1.7 - 2.4 mg/dL    Comment: Performed at [facility], [address] [place], [place] [address]  Ferritin     Status: Abnormal   Collection Time: 03/25/23  8:36 PM  Result Value Ref Range   Ferritin 6 (L) 24 - 336 ng/mL    Comment: Performed at [facility], [address] [place], [place] [address]  Iron and TIBC     Status: Abnormal  Collection Time: 03/25/23  8:36 PM  Result Value Ref Range   Iron 16 (L) 45 - 182 ug/dL   TIBC 550 749 - 549 ug/dL   Saturation Ratios 4 (L) 17.9 - 39.5 %   UIBC 433 ug/dL    Comment: Performed at [facility], [address] [place], [place] [address]  APTT     Status: None   Collection Time: 03/25/23  8:36 PM  Result Value Ref Range   aPTT 35 24 - 36 seconds    Comment: Performed at [facility], [address], [place], [place] [address]  Reticulocytes     Status: Abnormal   Collection Time: 03/25/23  8:36 PM  Result Value Ref Range   Retic Ct Pct 2.6 0.4 - 3.1 %    Comment: REPEATED TO VERIFY   RBC. 2.82 (L) 4.22 - 5.81 MIL/uL   Retic Count, Absolute 75.0 19.0 - 186.0 K/uL    Comment: REPEATED TO VERIFY   Immature Retic Fract 22.0 (H) 2.3 - 15.9 %    Comment: REPEATED TO VERIFY Performed at [facility], [address], [place], [place] [address]   Vitamin B12     Status: None    Collection Time: 03/25/23  8:36 PM  Result Value Ref Range   Vitamin B-12 831 180 - 914 pg/mL    Comment: (NOTE) This assay is not validated for testing neonatal or myeloproliferative syndrome specimens for Vitamin B12 levels. Performed at [facility], [address], [place], [place] [address]   Folate     Status: None   Collection Time: 03/25/23  8:36 PM  Result Value Ref Range   Folate 25.0 >5.9 ng/mL    Comment: Performed at [facility], [address], [place], [place] [address]  Technologist smear review     Status: None (Preliminary result)   Collection Time: 03/25/23  8:36 PM  Result Value Ref Range   WBC MORPHOLOGY MORPHOLOGY UNREMARKABLE    RBC MORPHOLOGY ROULEAUX    Plt Morphology Normal platelet morphology     Comment: Performed at [facility], [address] [place], [place] [address]   Clinical Information PENDING   CBC with Differential/Platelet     Status: Abnormal   Collection Time: 03/26/23  5:30 AM  Result Value Ref Range   WBC 8.2 4.0 - 10.5 K/uL   RBC 3.30 (L) 4.22 - 5.81 MIL/uL   Hemoglobin 8.5 (L) 13.0 - 17.0 g/dL    Comment: REPEATED TO VERIFY POST TRANSFUSION SPECIMEN    HCT 27.0 (L) 39.0 - 52.0 %   MCV 81.8 80.0 - 100.0 fL   MCH 25.8 (L) 26.0 - 34.0 pg   MCHC 31.5 30.0 - 36.0 g/dL   RDW 83.4 (H) 88.4 - 84.4 %   Platelets 175 150 - 400 K/uL   nRBC 0.0 0.0 - 0.2 %   Neutrophils Relative % 76 %   Neutro Abs 6.3 1.7 - 7.7 K/uL   Lymphocytes Relative 13 %   Lymphs Abs 1.1 0.7 - 4.0 K/uL   Monocytes Relative 8 %   Monocytes Absolute 0.6 0.1 - 1.0 K/uL   Eosinophils Relative 2 %   Eosinophils Absolute 0.2 0.0 - 0.5 K/uL   Basophils Relative 0 %   Basophils Absolute 0.0 0.0 - 0.1 K/uL   Immature Granulocytes 1 %   Abs Immature Granulocytes 0.04 0.00 - 0.07 K/uL    Comment: Performed at [facility], [address], [place], [place] [address]  Comprehensive metabolic panel     Status: Abnormal   Collection Time: 03/26/23   5:30 AM  Result Value Ref Range   Sodium 135 135 - 145 mmol/L   Potassium 3.4 (L) 3.5 - 5.1 mmol/L   Chloride 105 98 - 111 mmol/L   CO2 22 22 - 32 mmol/L   Glucose, Bld 145 (H) 70 - 99 mg/dL    Comment: Glucose reference range applies only to samples taken after fasting for at least 8 hours.   BUN 11 8 - 23 mg/dL   Creatinine, Ser 8.92 0.61 - 1.24 mg/dL   Calcium  8.1 (L) 8.9 - 10.3 mg/dL   Total Protein 5.7 (L) 6.5 - 8.1 g/dL   Albumin 2.7 (L) 3.5 - 5.0 g/dL   AST 21 15 - 41 U/L   ALT 19 0 - 44 U/L   Alkaline Phosphatase 68 38 - 126 U/L   Total Bilirubin 1.1 0.0 - 1.2 mg/dL   GFR, Estimated >39 >39 mL/min    Comment: (NOTE) Calculated using the CKD-EPI Creatinine Equation (2021)    Anion gap 8 5 - 15    Comment: Performed at [facility], [address] [place], [place] [address]  Magnesium     Status: None   Collection Time: 03/26/23  5:30 AM  Result Value Ref Range   Magnesium 2.2 1.7 - 2.4 mg/dL    Comment: Performed at [facility], [address], [place], [place] [address]   DG Chest Portable 1 View Result Date: 03/25/2023 CLINICAL DATA:  Shortness of breath. EXAM: PORTABLE CHEST 1 VIEW COMPARISON:  CT 5 days ago, radiograph 10 days ago FINDINGS: Mild hyperinflation with bronchial thickening. No focal airspace disease. The heart is normal in size. Mediastinal contours are normal. No pneumothorax, large pleural effusion, or pulmonary edema. Presumed monitoring device projecting over the left chest. IMPRESSION: Mild hyperinflation with bronchial thickening. No focal airspace disease. Electronically Signed   By: Andrea Gasman M.D.   On: 03/25/2023 21:10   CT Head Wo Contrast Result Date: 03/25/2023 CLINICAL DATA:  Head trauma, minor (Age >= 65y) FOTx1 week ago. EXAM: CT HEAD WITHOUT CONTRAST TECHNIQUE: Contiguous axial images were obtained from the base of the skull through the vertex without intravenous contrast. RADIATION DOSE REDUCTION: This exam was  performed according to the departmental dose-optimization program which includes automated exposure control, adjustment of the mA and/or kV according to patient size and/or use of iterative reconstruction technique. COMPARISON:  None Available. FINDINGS: Brain: No evidence of acute infarction, hemorrhage, hydrocephalus, extra-axial collection or mass lesion/mass effect. Vascular: No hyperdense vessel. Skull: No acute fracture. Sinuses/Orbits: Clear sinuses.  No acute orbital findings. Other: No mastoid effusions. IMPRESSION: No evidence of acute intracranial abnormality. Electronically Signed   By: Gilmore GORMAN Molt M.D.   On: 03/25/2023 19:03    Pending Labs Unresulted Labs (From admission, onward)     Start     Ordered   03/26/23 1700  CBC with Differential/Platelet  5A & 5P,   R (with TIMED occurrences)      03/26/23 0815   03/26/23 0815  Occult blood card to lab, stool  Once,   R        03/26/23 0814   03/26/23 0636  Phosphorus  Add-on,   AD        03/26/23 0635            Vitals/Pain Today's Vitals  03/26/23 0745 03/26/23 0800 03/26/23 0900 03/26/23 1100  BP: 122/65 121/60 126/62 116/60  Pulse: 68 62 63 (!) 57  Resp: 15 15 17 14   Temp:    98.3 F (36.8 C)  TempSrc:      SpO2: 97% 99% 100% 95%  Weight:      Height:      PainSc:        Isolation Precautions No active isolations  Medications Medications  acetaminophen  (TYLENOL ) tablet 650 mg (has no administration in time range)    Or  acetaminophen  (TYLENOL ) suppository 650 mg (has no administration in time range)  melatonin tablet 3 mg (has no administration in time range)  ondansetron  (ZOFRAN ) injection 4 mg (has no administration in time range)  metoprolol  tartrate (LOPRESSOR ) tablet 12.5 mg (12.5 mg Oral Given 03/26/23 0906)  atorvastatin  (LIPITOR) tablet 20 mg (20 mg Oral Given 03/26/23 0923)  dronedarone  (MULTAQ ) tablet 400 mg (400 mg Oral Given 03/26/23 0807)  fluticasone  furoate-vilanterol (BREO ELLIPTA ) 100-25  MCG/ACT 1 puff (1 puff Inhalation Given 03/26/23 0904)    And  umeclidinium bromide  (INCRUSE ELLIPTA ) 62.5 MCG/ACT 1 puff (1 puff Inhalation Given 03/26/23 0904)  albuterol  (PROVENTIL ) (2.5 MG/3ML) 0.083% nebulizer solution 2.5 mg (has no administration in time range)  nicotine  (NICODERM CQ  - dosed in mg/24 hours) patch 14 mg (has no administration in time range)  potassium chloride  SA (KLOR-CON  M) CR tablet 40 mEq (40 mEq Oral Given 03/26/23 0907)  pantoprazole  (PROTONIX ) injection 40 mg (40 mg Intravenous Given 03/26/23 0907)  0.9 %  sodium chloride  infusion (Manually program via Guardrails IV Fluids) (0 mLs Intravenous Stopped 03/25/23 2010)    Mobility walks     Focused Assessments     R Recommendations: See Admitting Provider Note  Report given to:   Additional Notes:

## 2023-03-27 DIAGNOSIS — D649 Anemia, unspecified: Secondary | ICD-10-CM | POA: Diagnosis not present

## 2023-03-27 LAB — TYPE AND SCREEN
ABO/RH(D): O POS
Antibody Screen: NEGATIVE
Unit division: 0
Unit division: 0

## 2023-03-27 LAB — CBC WITH DIFFERENTIAL/PLATELET
Abs Immature Granulocytes: 0.03 10*3/uL (ref 0.00–0.07)
Abs Immature Granulocytes: 0.02 10*3/uL (ref 0.00–0.07)
Basophils Absolute: 0 10*3/uL (ref 0.0–0.1)
Basophils Absolute: 0 10*3/uL (ref 0.0–0.1)
Basophils Relative: 0 %
Basophils Relative: 0 %
Eosinophils Absolute: 0.2 10*3/uL (ref 0.0–0.5)
Eosinophils Absolute: 0.2 10*3/uL (ref 0.0–0.5)
Eosinophils Relative: 2 %
Eosinophils Relative: 2 %
HCT: 25.3 % — ABNORMAL LOW (ref 39.0–52.0)
HCT: 27.8 % — ABNORMAL LOW (ref 39.0–52.0)
Hemoglobin: 8 g/dL — ABNORMAL LOW (ref 13.0–17.0)
Hemoglobin: 8.7 g/dL — ABNORMAL LOW (ref 13.0–17.0)
Immature Granulocytes: 0 %
Immature Granulocytes: 0 %
Lymphocytes Relative: 13 %
Lymphocytes Relative: 14 %
Lymphs Abs: 1.1 10*3/uL (ref 0.7–4.0)
Lymphs Abs: 1.1 10*3/uL (ref 0.7–4.0)
MCH: 25.6 pg — ABNORMAL LOW (ref 26.0–34.0)
MCH: 25.7 pg — ABNORMAL LOW (ref 26.0–34.0)
MCHC: 31.6 g/dL (ref 30.0–36.0)
MCHC: 31.3 g/dL (ref 30.0–36.0)
MCV: 81.1 fL (ref 80.0–100.0)
MCV: 82 fL (ref 80.0–100.0)
Monocytes Absolute: 0.8 10*3/uL (ref 0.1–1.0)
Monocytes Absolute: 0.7 10*3/uL (ref 0.1–1.0)
Monocytes Relative: 10 %
Monocytes Relative: 9 %
Neutro Abs: 5.9 10*3/uL (ref 1.7–7.7)
Neutro Abs: 5.5 10*3/uL (ref 1.7–7.7)
Neutrophils Relative %: 75 %
Neutrophils Relative %: 75 %
Platelets: 149 10*3/uL — ABNORMAL LOW (ref 150–400)
Platelets: 167 10*3/uL (ref 150–400)
RBC: 3.12 MIL/uL — ABNORMAL LOW (ref 4.22–5.81)
RBC: 3.39 MIL/uL — ABNORMAL LOW (ref 4.22–5.81)
RDW: 16.7 % — ABNORMAL HIGH (ref 11.5–15.5)
RDW: 16.7 % — ABNORMAL HIGH (ref 11.5–15.5)
WBC: 8 10*3/uL (ref 4.0–10.5)
WBC: 7.4 10*3/uL (ref 4.0–10.5)
nRBC: 0 % (ref 0.0–0.2)
nRBC: 0 % (ref 0.0–0.2)

## 2023-03-27 LAB — BPAM RBC
Blood Product Expiration Date: 202501292359
Blood Product Expiration Date: 202501292359
ISSUE DATE / TIME: 202412311949
ISSUE DATE / TIME: 202501010028
Unit Type and Rh: 202501292359
Unit Type and Rh: 5100
Unit Type and Rh: 5100

## 2023-03-27 LAB — BASIC METABOLIC PANEL
Anion gap: 9 (ref 5–15)
BUN: 14 mg/dL (ref 8–23)
CO2: 22 mmol/L (ref 22–32)
Calcium: 8.3 mg/dL — ABNORMAL LOW (ref 8.9–10.3)
Chloride: 102 mmol/L (ref 98–111)
Creatinine, Ser: 1.13 mg/dL (ref 0.61–1.24)
GFR, Estimated: >60 mL/min (ref >60)
Glucose, Bld: 90 mg/dL (ref 70–99)
Potassium: 4.1 mmol/L (ref 3.5–5.1)
Sodium: 133 mmol/L — ABNORMAL LOW (ref 135–145)

## 2023-03-27 LAB — OCCULT BLOOD X 1 CARD TO LAB, STOOL: Fecal Occult Bld: POSITIVE — AB

## 2023-03-27 MED ORDER — BISACODYL 5 MG PO TBEC
10.0000 mg | DELAYED_RELEASE_TABLET | Freq: Once | ORAL | Status: AC
  Administered 2023-03-27: 10 mg via ORAL
  Filled 2023-03-27: qty 2

## 2023-03-27 MED ORDER — PEG 3350-KCL-NA BICARB-NACL 420 G PO SOLR
4000.0000 mL | Freq: Once | ORAL | Status: AC
Start: 2023-03-27 — End: 2023-03-27
  Administered 2023-03-27: 4000 mL via ORAL
  Filled 2023-03-27: qty 4000

## 2023-03-27 NOTE — Plan of Care (Signed)
   Problem: Safety: Goal: Ability to remain free from injury will improve Outcome: Progressing

## 2023-03-27 NOTE — H&P (View-Only) (Signed)
 [facility] Consult  Referring Provider: No ref. provider found Primary Care Physician:  Chrystal Lamarr RAMAN, MD Primary Gastroenterologist: [facility] - Dr. Dyane [facility] PCP - Dr. Chrystal  Reason for Consultation: symptomatic anemia, FOBT (+)  SUBJECTIVE:   HPI: Julian Weaver is a 78 y.o. male with past medical history significant for coronary artery disease status post drug eluting stent 2010, hypertension, paroxysmal atrial fibrillation (last dose Eliquis  AM on 03/25/23). Has been experiencing significant fatigue, exertional dyspnea for the past 1 month. FOBT testing was positive and he was referred to [facility] in late November. More recently, PCP completed blood work which showed low Hgb and he was recommended to seek emergent medical attention. Hgb on admission 5.8 (now 8.0 status post 2 units PRBC), PLT 149, iron deficient, INR 1.2, WBC 8.0, Na 133, K 4.1, BUN/Cr 14/1.13.   Patient noted having fatigue for the past 1 month. No visible/gross blood per rectum. No abdominal pain. No chest pain. Having shortness of breath. No prior abdominal surgeries. No unintentional weight loss, has been gaining weight. Was treated for pneumonia recently which he initially attributed to his symptoms. No family history colon cancer.   No prior EGD.  Colonoscopy 06/24/2007 Dr. Norleen Dyane - screening - normal exam.  Esophagram 12/19/2017 dysphagia - occasional tertiary contractions.   Past Medical History:  Diagnosis Date   Allergic rhinitis    Aortic atherosclerosis (HCC)    Arthritis    Carotid artery stenosis    1-39% bilateral dopplers 11/2022   Coronary artery disease 04/13/2008   Drug eluting stent in RCA // Myoview  04/2019: EF 52, normal perfusion; low risk   Hypercholesteremia    Hypertension    PAF (paroxysmal atrial fibrillation) (HCC)    RBBB    Renal disorder    Tobacco abuse    Past Surgical History:  Procedure Laterality Date   CAROTID STENT  2010   SHOULDER  ACROMIOPLASTY  2010   Prior to Admission medications   Medication Sig Start Date End Date Taking? Authorizing Provider  albuterol  (VENTOLIN  HFA) 108 (90 Base) MCG/ACT inhaler Inhale 2 puffs into the lungs every 4 (four) hours as needed for shortness of breath.   Yes [provider]  amLODipine  (NORVASC ) 5 MG tablet TAKE 1 TABLET(5 MG) BY MOUTH DAILY Patient taking differently: Take 5 mg by mouth daily. 11/27/22  Yes Turner, Wilbert SAUNDERS, MD  apixaban  (ELIQUIS ) 5 MG TABS tablet Take 1 tablet (5 mg total) by mouth 2 (two) times daily. 12/23/22  Yes Turner, Wilbert SAUNDERS, MD  aspirin  EC 81 MG tablet Take 81 mg by mouth daily. Swallow whole.   Yes [provider]  atorvastatin  (LIPITOR) 20 MG tablet TAKE 1 TABLET(20 MG) BY MOUTH DAILY Patient taking differently: Take 20 mg by mouth daily. TAKE 1 TABLET(20 MG) BY MOUTH DAILY 10/28/22  Yes Turner, Traci R, MD  Coenzyme Q10 (CO Q-10) 100 MG CAPS Take 1 capsule by mouth every morning.   Yes [provider]  cyanocobalamin  (VITAMIN B12) 1000 MCG tablet Take 1,000 mcg by mouth daily.   Yes [provider]  dronedarone  (MULTAQ ) 400 MG tablet Take 1 tablet (400 mg total) by mouth 2 (two) times daily with a meal. 11/04/22  Yes Fenton, Clint R, PA  Fluticasone -Umeclidin-Vilant 100-62.5-25 MCG/ACT AEPB Inhale 1 puff into the lungs at bedtime. Not sure the dosage   Yes [provider]  furosemide (LASIX) 20 MG tablet Take 20 mg by mouth daily. 03/25/23  Yes [provider]  metoprolol  tartrate (LOPRESSOR ) 25 MG tablet TAKE 1/2 TABLET BY MOUTH TWICE DAILY 11/27/22  Yes Turner, Wilbert SAUNDERS, MD  EPINEPHrine (EPIPEN IJ) Inject as directed as needed (bee stings).  Patient not taking: Reported on 03/25/2023    [provider]  nitroGLYCERIN  (NITROSTAT ) 0.4 MG SL tablet Place 1 tablet (0.4 mg total) under the tongue every 5 (five) minutes as needed for chest pain. Patient not taking: Reported on 03/25/2023 11/22/21   Shlomo Wilbert SAUNDERS, MD  omeprazole (PRILOSEC) 20 MG capsule Take 20 mg by mouth as needed. Patient not taking: Reported on 03/25/2023    [provider]   Current Facility-Administered Medications  Medication Dose Route Frequency Provider Last Rate Last Admin   acetaminophen  (TYLENOL ) tablet 650 mg  650 mg Oral Q6H PRN Howerter, Justin B, DO       Or   acetaminophen  (TYLENOL ) suppository 650 mg  650 mg Rectal Q6H PRN Howerter, Justin B, DO       albuterol  (PROVENTIL ) (2.5 MG/3ML) 0.083% nebulizer solution 2.5 mg  2.5 mg Nebulization Q4H PRN Howerter, Justin B, DO       atorvastatin  (LIPITOR) tablet 20 mg  20 mg Oral Daily Howerter, Justin B, DO   20 mg at 03/27/23 1124   dronedarone  (MULTAQ ) tablet 400 mg  400 mg Oral BID WC Howerter, Justin B, DO   400 mg at 03/27/23 9165   fluticasone  furoate-vilanterol (BREO ELLIPTA ) 100-25 MCG/ACT 1 puff  1 puff Inhalation Daily Howerter, Justin B, DO   1 puff at 03/27/23 0908   And   umeclidinium bromide  (INCRUSE ELLIPTA ) 62.5 MCG/ACT 1 puff  1 puff Inhalation Daily Howerter, Justin B, DO   1 puff at 03/27/23 0907   melatonin tablet 3 mg  3 mg Oral QHS PRN Howerter, Justin B, DO       metoprolol  tartrate (LOPRESSOR ) tablet 12.5 mg  12.5 mg Oral BID Howerter, Justin B, DO   12.5 mg at 03/27/23 1125   nicotine  (NICODERM CQ  - dosed in mg/24 hours) patch 14 mg  14 mg Transdermal Daily PRN Howerter, Justin B, DO       ondansetron  (ZOFRAN ) injection 4 mg  4 mg Intravenous Q6H PRN Howerter, Justin B, DO       pantoprazole  (PROTONIX ) injection 40 mg  40 mg Intravenous Q12H Pahwani, Ravi, MD   40 mg at 03/27/23 1126   Allergies as of 03/25/2023 - Review Complete 03/25/2023  Allergen Reaction Noted   Bee venom Swelling 03/11/2013   Amoxicillin-pot clavulanate Nausea And Vomiting 01/26/2020   Hydromorphone   08/09/2020   Tramadol  Nausea Only 08/09/2020   Family History  Problem Relation Age of Onset   Stroke Mother    Stroke Father    Social History    Socioeconomic History   Marital status: Married    Spouse name: Not on file   Number of children: Not on file   Years of education: Not on file   Highest education level: Not on file  Occupational History   Not on file  Tobacco Use   Smoking status: Every Day    Current packs/day: 1.00    Average packs/day: 1 pack/day for 56.4 years (56.4 ttl pk-yrs)    Types: Cigarettes    Start date: 10/27/1982   Smokeless tobacco: Never   Tobacco comments:    Half-pack daily 11/09/2020  Substance and Sexual Activity   Alcohol use: Yes    Alcohol/week: 1.0 standard drink of alcohol  Types: 1 Cans of beer per week    Comment: Rarely- 3 times a month   Drug use: No   Sexual activity: Not on file  Other Topics Concern   Not on file  Social History Narrative   Not on file   Social Drivers of Health   Financial Resource Strain: Not on file  Food Insecurity: No Food Insecurity (03/26/2023)   Hunger Vital Sign    Worried About Running Out of Food in the Last Year: Never true    Ran Out of Food in the Last Year: Never true  Transportation Needs: No Transportation Needs (03/26/2023)   PRAPARE - Administrator, Civil Service (Medical): No    Lack of Transportation (Non-Medical): No  Physical Activity: Not on file  Stress: Not on file  Social Connections: Moderately Isolated (03/26/2023)   Social Connection and Isolation Panel [NHANES]    Frequency of Communication with Friends and Family: Twice a week    Frequency of Social Gatherings with Friends and Family: Twice a week    Attends Religious Services: Never    Database Administrator or Organizations: No    Attends Banker Meetings: Never    Marital Status: Married  Catering Manager Violence: Not At Risk (03/26/2023)   Humiliation, Afraid, Rape, and Kick questionnaire    Fear of Current or Ex-Partner: No    Emotionally Abused: No    Physically Abused: No    Sexually Abused: No   Review of Systems:  Review of  Systems  Constitutional:  Positive for malaise/fatigue.  Respiratory:  Positive for shortness of breath.   Cardiovascular:  Negative for chest pain.  Gastrointestinal:  Negative for abdominal pain, blood in stool, constipation and diarrhea.    OBJECTIVE:   Temp:  [97.8 F (36.6 C)-99.1 F (37.3 C)] 98.4 F (36.9 C) (01/02 0845) Pulse Rate:  [56-65] 60 (01/02 1125) Resp:  [16-20] 16 (01/02 0845) BP: (106-127)/(57-79) 116/60 (01/02 1125) SpO2:  [95 %-96 %] 95 % (01/02 0907) Weight:  [75.9 kg] 75.9 kg (01/02 0400) Last BM Date : 03/27/23 Physical Exam Constitutional:      General: He is not in acute distress.    Appearance: He is not ill-appearing, toxic-appearing or diaphoretic.  Cardiovascular:     Rate and Rhythm: Normal rate and regular rhythm.  Pulmonary:     Effort: No respiratory distress.     Breath sounds: Normal breath sounds.  Abdominal:     General: Bowel sounds are normal. There is no distension.     Palpations: Abdomen is soft.     Tenderness: There is no abdominal tenderness. There is no guarding.  Musculoskeletal:     Right lower leg: No edema.     Left lower leg: No edema.  Skin:    General: Skin is warm and dry.  Neurological:     Mental Status: He is alert.     Labs: Recent Labs    03/26/23 0530 03/26/23 1614 03/27/23 0356  WBC 8.2 8.1 8.0  HGB 8.5* 8.2* 8.0*  HCT 27.0* 26.6* 25.3*  PLT 175 163 149*   BMET Recent Labs    03/25/23 1610 03/25/23 1619 03/26/23 0530 03/27/23 1139  NA 134* 134* 135 133*  K 3.7 3.7 3.4* 4.1  CL 101 101 105 102  CO2 21*  --  22 22  GLUCOSE 128* 123* 145* 90  BUN 15 13 11 14   CREATININE 1.24 1.20 1.07 1.13  CALCIUM  7.8*  --  8.1* 8.3*   LFT Recent Labs    03/26/23 0530  PROT 5.7*  ALBUMIN 2.7*  AST 21  ALT 19  ALKPHOS 68  BILITOT 1.1   PT/INR Recent Labs    03/25/23 1610  LABPROT 15.1  INR 1.2    Diagnostic imaging: DG Chest Portable 1 View Result Date: 03/25/2023 CLINICAL DATA:   Shortness of breath. EXAM: PORTABLE CHEST 1 VIEW COMPARISON:  CT 5 days ago, radiograph 10 days ago FINDINGS: Mild hyperinflation with bronchial thickening. No focal airspace disease. The heart is normal in size. Mediastinal contours are normal. No pneumothorax, large pleural effusion, or pulmonary edema. Presumed monitoring device projecting over the left chest. IMPRESSION: Mild hyperinflation with bronchial thickening. No focal airspace disease. Electronically Signed   By: Andrea Gasman M.D.   On: 03/25/2023 21:10   CT Head Wo Contrast Result Date: 03/25/2023 CLINICAL DATA:  Head trauma, minor (Age >= 65y) FOTx1 week ago. EXAM: CT HEAD WITHOUT CONTRAST TECHNIQUE: Contiguous axial images were obtained from the base of the skull through the vertex without intravenous contrast. RADIATION DOSE REDUCTION: This exam was performed according to the departmental dose-optimization program which includes automated exposure control, adjustment of the mA and/or kV according to patient size and/or use of iterative reconstruction technique. COMPARISON:  None Available. FINDINGS: Brain: No evidence of acute infarction, hemorrhage, hydrocephalus, extra-axial collection or mass lesion/mass effect. Vascular: No hyperdense vessel. Skull: No acute fracture. Sinuses/Orbits: Clear sinuses.  No acute orbital findings. Other: No mastoid effusions. IMPRESSION: No evidence of acute intracranial abnormality. Electronically Signed   By: Gilmore GORMAN Molt M.D.   On: 03/25/2023 19:03    IMPRESSION: Symptomatic, iron deficiency anemia Fecal occult positive stool  Paroxysmal atrial fibrillation, last dose Eliquis  AM 03/25/23 Hypertension Coronary artery disease status post drug eluting stent 2010  PLAN: -Recommend EGD and colonoscopy to further investigate symptomatic anemia, discussed procedures with patient including benefits, alternatives and risks of bleeding/infection/perforation/missed lesion/anesthesia, he verbalized  understanding and elected to proceed -He had some solid food this AM, changed to liquid diet this afternoon -Ok to attempt bowel preparation this afternoon -Tap water enema x 3 tomorrow AM if bowel movements not clear  -NPO at midnight for EGD/COL 03/28/23 -Continue to hold Eliquis  -Trend H/H, transfuse for Hgb < 7 -Further recommendations to follow pending procedures    LOS: 1 day   Estefana Keas, DO [facility]

## 2023-03-27 NOTE — Plan of Care (Signed)

## 2023-03-27 NOTE — Consult Note (Signed)
 [facility] Consult  Referring Provider: No ref. provider found Primary Care Physician:  Chrystal Lamarr RAMAN, MD Primary Gastroenterologist: [facility] - Dr. Dyane [facility] PCP - Dr. Chrystal  Reason for Consultation: symptomatic anemia, FOBT (+)  SUBJECTIVE:   HPI: Julian Weaver is a 78 y.o. male with past medical history significant for coronary artery disease status post drug eluting stent 2010, hypertension, paroxysmal atrial fibrillation (last dose Eliquis  AM on 03/25/23). Has been experiencing significant fatigue, exertional dyspnea for the past 1 month. FOBT testing was positive and he was referred to [facility] in late November. More recently, PCP completed blood work which showed low Hgb and he was recommended to seek emergent medical attention. Hgb on admission 5.8 (now 8.0 status post 2 units PRBC), PLT 149, iron deficient, INR 1.2, WBC 8.0, Na 133, K 4.1, BUN/Cr 14/1.13.   Patient noted having fatigue for the past 1 month. No visible/gross blood per rectum. No abdominal pain. No chest pain. Having shortness of breath. No prior abdominal surgeries. No unintentional weight loss, has been gaining weight. Was treated for pneumonia recently which he initially attributed to his symptoms. No family history colon cancer.   No prior EGD.  Colonoscopy 06/24/2007 Dr. Norleen Dyane - screening - normal exam.  Esophagram 12/19/2017 dysphagia - occasional tertiary contractions.   Past Medical History:  Diagnosis Date   Allergic rhinitis    Aortic atherosclerosis (HCC)    Arthritis    Carotid artery stenosis    1-39% bilateral dopplers 11/2022   Coronary artery disease 04/13/2008   Drug eluting stent in RCA // Myoview  04/2019: EF 52, normal perfusion; low risk   Hypercholesteremia    Hypertension    PAF (paroxysmal atrial fibrillation) (HCC)    RBBB    Renal disorder    Tobacco abuse    Past Surgical History:  Procedure Laterality Date   CAROTID STENT  2010   SHOULDER  ACROMIOPLASTY  2010   Prior to Admission medications   Medication Sig Start Date End Date Taking? Authorizing Provider  albuterol  (VENTOLIN  HFA) 108 (90 Base) MCG/ACT inhaler Inhale 2 puffs into the lungs every 4 (four) hours as needed for shortness of breath.   Yes [provider]  amLODipine  (NORVASC ) 5 MG tablet TAKE 1 TABLET(5 MG) BY MOUTH DAILY Patient taking differently: Take 5 mg by mouth daily. 11/27/22  Yes Turner, Wilbert SAUNDERS, MD  apixaban  (ELIQUIS ) 5 MG TABS tablet Take 1 tablet (5 mg total) by mouth 2 (two) times daily. 12/23/22  Yes Turner, Wilbert SAUNDERS, MD  aspirin  EC 81 MG tablet Take 81 mg by mouth daily. Swallow whole.   Yes [provider]  atorvastatin  (LIPITOR) 20 MG tablet TAKE 1 TABLET(20 MG) BY MOUTH DAILY Patient taking differently: Take 20 mg by mouth daily. TAKE 1 TABLET(20 MG) BY MOUTH DAILY 10/28/22  Yes Turner, Traci R, MD  Coenzyme Q10 (CO Q-10) 100 MG CAPS Take 1 capsule by mouth every morning.   Yes [provider]  cyanocobalamin  (VITAMIN B12) 1000 MCG tablet Take 1,000 mcg by mouth daily.   Yes [provider]  dronedarone  (MULTAQ ) 400 MG tablet Take 1 tablet (400 mg total) by mouth 2 (two) times daily with a meal. 11/04/22  Yes Fenton, Clint R, PA  Fluticasone -Umeclidin-Vilant 100-62.5-25 MCG/ACT AEPB Inhale 1 puff into the lungs at bedtime. Not sure the dosage   Yes [provider]  furosemide (LASIX) 20 MG tablet Take 20 mg by mouth daily. 03/25/23  Yes [provider]  metoprolol  tartrate (LOPRESSOR ) 25 MG tablet TAKE 1/2 TABLET BY MOUTH TWICE DAILY 11/27/22  Yes Turner, Wilbert SAUNDERS, MD  EPINEPHrine (EPIPEN IJ) Inject as directed as needed (bee stings).  Patient not taking: Reported on 03/25/2023    [provider]  nitroGLYCERIN  (NITROSTAT ) 0.4 MG SL tablet Place 1 tablet (0.4 mg total) under the tongue every 5 (five) minutes as needed for chest pain. Patient not taking: Reported on 03/25/2023 11/22/21   Shlomo Wilbert SAUNDERS, MD  omeprazole (PRILOSEC) 20 MG capsule Take 20 mg by mouth as needed. Patient not taking: Reported on 03/25/2023    [provider]   Current Facility-Administered Medications  Medication Dose Route Frequency Provider Last Rate Last Admin   acetaminophen  (TYLENOL ) tablet 650 mg  650 mg Oral Q6H PRN Howerter, Justin B, DO       Or   acetaminophen  (TYLENOL ) suppository 650 mg  650 mg Rectal Q6H PRN Howerter, Justin B, DO       albuterol  (PROVENTIL ) (2.5 MG/3ML) 0.083% nebulizer solution 2.5 mg  2.5 mg Nebulization Q4H PRN Howerter, Justin B, DO       atorvastatin  (LIPITOR) tablet 20 mg  20 mg Oral Daily Howerter, Justin B, DO   20 mg at 03/27/23 1124   dronedarone  (MULTAQ ) tablet 400 mg  400 mg Oral BID WC Howerter, Justin B, DO   400 mg at 03/27/23 9165   fluticasone  furoate-vilanterol (BREO ELLIPTA ) 100-25 MCG/ACT 1 puff  1 puff Inhalation Daily Howerter, Justin B, DO   1 puff at 03/27/23 0908   And   umeclidinium bromide  (INCRUSE ELLIPTA ) 62.5 MCG/ACT 1 puff  1 puff Inhalation Daily Howerter, Justin B, DO   1 puff at 03/27/23 0907   melatonin tablet 3 mg  3 mg Oral QHS PRN Howerter, Justin B, DO       metoprolol  tartrate (LOPRESSOR ) tablet 12.5 mg  12.5 mg Oral BID Howerter, Justin B, DO   12.5 mg at 03/27/23 1125   nicotine  (NICODERM CQ  - dosed in mg/24 hours) patch 14 mg  14 mg Transdermal Daily PRN Howerter, Justin B, DO       ondansetron  (ZOFRAN ) injection 4 mg  4 mg Intravenous Q6H PRN Howerter, Justin B, DO       pantoprazole  (PROTONIX ) injection 40 mg  40 mg Intravenous Q12H Pahwani, Ravi, MD   40 mg at 03/27/23 1126   Allergies as of 03/25/2023 - Review Complete 03/25/2023  Allergen Reaction Noted   Bee venom Swelling 03/11/2013   Amoxicillin-pot clavulanate Nausea And Vomiting 01/26/2020   Hydromorphone   08/09/2020   Tramadol  Nausea Only 08/09/2020   Family History  Problem Relation Age of Onset   Stroke Mother    Stroke Father    Social History    Socioeconomic History   Marital status: Married    Spouse name: Not on file   Number of children: Not on file   Years of education: Not on file   Highest education level: Not on file  Occupational History   Not on file  Tobacco Use   Smoking status: Every Day    Current packs/day: 1.00    Average packs/day: 1 pack/day for 56.4 years (56.4 ttl pk-yrs)    Types: Cigarettes    Start date: 10/27/1982   Smokeless tobacco: Never   Tobacco comments:    Half-pack daily 11/09/2020  Substance and Sexual Activity   Alcohol use: Yes    Alcohol/week: 1.0 standard drink of alcohol  Types: 1 Cans of beer per week    Comment: Rarely- 3 times a month   Drug use: No   Sexual activity: Not on file  Other Topics Concern   Not on file  Social History Narrative   Not on file   Social Drivers of Health   Financial Resource Strain: Not on file  Food Insecurity: No Food Insecurity (03/26/2023)   Hunger Vital Sign    Worried About Running Out of Food in the Last Year: Never true    Ran Out of Food in the Last Year: Never true  Transportation Needs: No Transportation Needs (03/26/2023)   PRAPARE - Administrator, Civil Service (Medical): No    Lack of Transportation (Non-Medical): No  Physical Activity: Not on file  Stress: Not on file  Social Connections: Moderately Isolated (03/26/2023)   Social Connection and Isolation Panel [NHANES]    Frequency of Communication with Friends and Family: Twice a week    Frequency of Social Gatherings with Friends and Family: Twice a week    Attends Religious Services: Never    Database Administrator or Organizations: No    Attends Banker Meetings: Never    Marital Status: Married  Catering Manager Violence: Not At Risk (03/26/2023)   Humiliation, Afraid, Rape, and Kick questionnaire    Fear of Current or Ex-Partner: No    Emotionally Abused: No    Physically Abused: No    Sexually Abused: No   Review of Systems:  Review of  Systems  Constitutional:  Positive for malaise/fatigue.  Respiratory:  Positive for shortness of breath.   Cardiovascular:  Negative for chest pain.  Gastrointestinal:  Negative for abdominal pain, blood in stool, constipation and diarrhea.    OBJECTIVE:   Temp:  [97.8 F (36.6 C)-99.1 F (37.3 C)] 98.4 F (36.9 C) (01/02 0845) Pulse Rate:  [56-65] 60 (01/02 1125) Resp:  [16-20] 16 (01/02 0845) BP: (106-127)/(57-79) 116/60 (01/02 1125) SpO2:  [95 %-96 %] 95 % (01/02 0907) Weight:  [75.9 kg] 75.9 kg (01/02 0400) Last BM Date : 03/27/23 Physical Exam Constitutional:      General: He is not in acute distress.    Appearance: He is not ill-appearing, toxic-appearing or diaphoretic.  Cardiovascular:     Rate and Rhythm: Normal rate and regular rhythm.  Pulmonary:     Effort: No respiratory distress.     Breath sounds: Normal breath sounds.  Abdominal:     General: Bowel sounds are normal. There is no distension.     Palpations: Abdomen is soft.     Tenderness: There is no abdominal tenderness. There is no guarding.  Musculoskeletal:     Right lower leg: No edema.     Left lower leg: No edema.  Skin:    General: Skin is warm and dry.  Neurological:     Mental Status: He is alert.     Labs: Recent Labs    03/26/23 0530 03/26/23 1614 03/27/23 0356  WBC 8.2 8.1 8.0  HGB 8.5* 8.2* 8.0*  HCT 27.0* 26.6* 25.3*  PLT 175 163 149*   BMET Recent Labs    03/25/23 1610 03/25/23 1619 03/26/23 0530 03/27/23 1139  NA 134* 134* 135 133*  K 3.7 3.7 3.4* 4.1  CL 101 101 105 102  CO2 21*  --  22 22  GLUCOSE 128* 123* 145* 90  BUN 15 13 11 14   CREATININE 1.24 1.20 1.07 1.13  CALCIUM  7.8*  --  8.1* 8.3*   LFT Recent Labs    03/26/23 0530  PROT 5.7*  ALBUMIN 2.7*  AST 21  ALT 19  ALKPHOS 68  BILITOT 1.1   PT/INR Recent Labs    03/25/23 1610  LABPROT 15.1  INR 1.2    Diagnostic imaging: DG Chest Portable 1 View Result Date: 03/25/2023 CLINICAL DATA:   Shortness of breath. EXAM: PORTABLE CHEST 1 VIEW COMPARISON:  CT 5 days ago, radiograph 10 days ago FINDINGS: Mild hyperinflation with bronchial thickening. No focal airspace disease. The heart is normal in size. Mediastinal contours are normal. No pneumothorax, large pleural effusion, or pulmonary edema. Presumed monitoring device projecting over the left chest. IMPRESSION: Mild hyperinflation with bronchial thickening. No focal airspace disease. Electronically Signed   By: Andrea Gasman M.D.   On: 03/25/2023 21:10   CT Head Wo Contrast Result Date: 03/25/2023 CLINICAL DATA:  Head trauma, minor (Age >= 65y) FOTx1 week ago. EXAM: CT HEAD WITHOUT CONTRAST TECHNIQUE: Contiguous axial images were obtained from the base of the skull through the vertex without intravenous contrast. RADIATION DOSE REDUCTION: This exam was performed according to the departmental dose-optimization program which includes automated exposure control, adjustment of the mA and/or kV according to patient size and/or use of iterative reconstruction technique. COMPARISON:  None Available. FINDINGS: Brain: No evidence of acute infarction, hemorrhage, hydrocephalus, extra-axial collection or mass lesion/mass effect. Vascular: No hyperdense vessel. Skull: No acute fracture. Sinuses/Orbits: Clear sinuses.  No acute orbital findings. Other: No mastoid effusions. IMPRESSION: No evidence of acute intracranial abnormality. Electronically Signed   By: Gilmore GORMAN Molt M.D.   On: 03/25/2023 19:03    IMPRESSION: Symptomatic, iron deficiency anemia Fecal occult positive stool  Paroxysmal atrial fibrillation, last dose Eliquis  AM 03/25/23 Hypertension Coronary artery disease status post drug eluting stent 2010  PLAN: -Recommend EGD and colonoscopy to further investigate symptomatic anemia, discussed procedures with patient including benefits, alternatives and risks of bleeding/infection/perforation/missed lesion/anesthesia, he verbalized  understanding and elected to proceed -He had some solid food this AM, changed to liquid diet this afternoon -Ok to attempt bowel preparation this afternoon -Tap water enema x 3 tomorrow AM if bowel movements not clear  -NPO at midnight for EGD/COL 03/28/23 -Continue to hold Eliquis  -Trend H/H, transfuse for Hgb < 7 -Further recommendations to follow pending procedures    LOS: 1 day   Estefana Keas, DO [facility]

## 2023-03-27 NOTE — Progress Notes (Signed)
 PROGRESS NOTE    Julian Weaver  FMW:[id number] DOB: 1946-01-16 DOA: 03/25/2023 PCP: Chrystal Lamarr RAMAN, MD   Brief Narrative:  Julian Weaver is a 78 y.o. male with medical history significant for paroxysmal atrial fibrillation chronically anticoagulated on Eliquis , essential pretension, hyperlipidemia, COPD, who is admitted to [facility] on 03/25/2023 with subacute symptomatic anemia after presenting from home to [facility] ED for further evaluation of low hemoglobin identified on outpatient labs.   Assessment & Plan:   Principal Problem:   Symptomatic anemia Active Problems:   Essential hypertension   Tobacco abuse   Paroxysmal atrial fibrillation (HCC)   Syncope   Postural dizziness   Hyperlipidemia   History of COPD   Acute blood loss anemia  Acute vs Subacute symptomatic anemia: Symptomatic with dyspnea for 2 to 3 weeks.  Hemoglobin 5.8 upon arrival, status post 2 unit of PRBC transfusion, hemoglobin 8.5 yesterday posttransfusion and 8 today.  Initial FOBT negative.  Suspected GI bleed, ordered more FOBT.  Saw him this morning, he had a bowel movement, while FOBT was pending, had a discussion with him that if one of the 3 FOBT will be positive, this will indicate possible GI bleed and GI will be consulted.  Unfortunately, his FOBT is positive now.  I have consulted [facility].  He may warrant EGD.  Monitor H&H every 12 hours and transfuse if less than 7.  Continue to hold Eliquis .    Paroxysmal atrial fibrillation: CHA2DS2-VASc score of 3 however currently with presumed GI bleed, rates controlled on current home dose of Multaq  but continue to hold Eliquis  and monitor.   Essential Hypertension: Blood pressure very well-controlled on home metoprolol .  Amlodipine  on hold.    Hypokalemia: Resolved  Hyponatremia: Resolved.   Hyperlipidemia: Continue atorvastatin .   COPD: Stable, no wheezes.  Cont outpatient Trelegy. Prn albuterol  nebulizer.    Chronic tobacco  abuse: Patient conveys that they are a current smoker, having smoked half pack to 1 pack/day over the last 40 years.  I have discussed tobacco cessation with the patient.  I have counseled the patient regarding the negative impacts of continued tobacco use including but not limited to lung cancer, COPD, and cardiovascular disease.  I have discussed alternatives to tobacco and modalities that may help facilitate tobacco cessation including but not limited to biofeedback, hypnosis, and medications.  Total time spent with tobacco counseling was 5 minutes.  DVT prophylaxis: SCDs Start: 03/25/23 2006   Code Status: Full Code  Family Communication:  None present at bedside.  Plan of care discussed with patient in length and he/she verbalized understanding and agreed with it.  Status is: Observation The patient will require care spanning > 2 midnights and should be moved to inpatient because: Needs more observation in the hospital   Estimated body mass index is 25.45 kg/m as calculated from the following:   Height as of this encounter: 5' 8 (1.727 m).   Weight as of this encounter: 75.9 kg.    Nutritional Assessment: Body mass index is 25.45 kg/m.SABRA Seen by dietician.  I agree with the assessment and plan as outlined below: Nutrition Status:        . Skin Assessment: I have examined the patient's skin and I agree with the wound assessment as performed by the wound care RN as outlined below:    Consultants:  None  Procedures:  None  Antimicrobials:  Anti-infectives (From admission, onward)    None  Subjective: Seen and examined.  Very pleasant gentleman.  He has no complaint at all.  Objective: Vitals:   03/26/23 2305 03/27/23 0400 03/27/23 0907 03/27/23 1125  BP: (!) 122/57 106/64  116/60  Pulse: (!) 59 (!) 57  60  Resp: 16 16    Temp: 98.5 F (36.9 C) 98.6 F (37 C)    TempSrc: Oral Oral    SpO2: 96% 95% 95%   Weight:  75.9 kg    Height:         Intake/Output Summary (Last 24 hours) at 03/27/2023 1133 Last data filed at 03/27/2023 0835 Gross per 24 hour  Intake 180 ml  Output --  Net 180 ml   Filed Weights   03/25/23 1601 03/27/23 0400  Weight: 80.3 kg 75.9 kg    Examination:  General exam: Appears calm and comfortable  Respiratory system: Clear to auscultation. Respiratory effort normal. Cardiovascular system: S1 & S2 heard, RRR. No JVD, murmurs, rubs, gallops or clicks. No pedal edema. Gastrointestinal system: Abdomen is nondistended, soft and nontender. No organomegaly or masses felt. Normal bowel sounds heard. Central nervous system: Alert and oriented. No focal neurological deficits. Extremities: Symmetric 5 x 5 power. Skin: No rashes, lesions or ulcers.  Psychiatry: Judgement and insight appear normal. Mood & affect appropriate.    Data Reviewed: I have personally reviewed following labs and imaging studies  CBC: Recent Labs  Lab 03/25/23 1610 03/25/23 1619 03/26/23 0530 03/26/23 1614 03/27/23 0356  WBC 8.1  --  8.2 8.1 8.0  NEUTROABS 6.2  --  6.3 5.8 5.9  HGB 5.8* 6.5* 8.5* 8.2* 8.0*  HCT 19.2* 19.0* 27.0* 26.6* 25.3*  MCV 80.0  --  81.8 82.1 81.1  PLT 180  --  175 163 149*   Basic Metabolic Panel: Recent Labs  Lab 03/25/23 1610 03/25/23 1619 03/25/23 2036 03/26/23 0530 03/26/23 1614  NA 134* 134*  --  135  --   K 3.7 3.7  --  3.4*  --   CL 101 101  --  105  --   CO2 21*  --   --  22  --   GLUCOSE 128* 123*  --  145*  --   BUN 15 13  --  11  --   CREATININE 1.24 1.20  --  1.07  --   CALCIUM  7.8*  --   --  8.1*  --   MG  --   --  1.8 2.2  --   PHOS  --   --   --   --  2.6   GFR: Estimated Creatinine Clearance: 57.7 mL/min (by C-G formula based on SCr of 1.07 mg/dL). Liver Function Tests: Recent Labs  Lab 03/25/23 1610 03/26/23 0530  AST 23 21  ALT 20 19  ALKPHOS 73 68  BILITOT 0.5 1.1  PROT 5.6* 5.7*  ALBUMIN 2.9* 2.7*   No results for input(s): LIPASE, AMYLASE in the last  168 hours. No results for input(s): AMMONIA in the last 168 hours. Coagulation Profile: Recent Labs  Lab 03/25/23 1610  INR 1.2   Cardiac Enzymes: No results for input(s): CKTOTAL, CKMB, CKMBINDEX, TROPONINI in the last 168 hours. BNP (last 3 results) No results for input(s): PROBNP in the last 8760 hours. HbA1C: No results for input(s): HGBA1C in the last 72 hours. CBG: No results for input(s): GLUCAP in the last 168 hours. Lipid Profile: No results for input(s): CHOL, HDL, LDLCALC, TRIG, CHOLHDL, LDLDIRECT in the last 72 hours. Thyroid  Function  Tests: No results for input(s): TSH, T4TOTAL, FREET4, T3FREE, THYROIDAB in the last 72 hours. Anemia Panel: Recent Labs    03/25/23 2036  VITAMINB12 831  FOLATE 25.0  FERRITIN 6*  TIBC 449  IRON 16*  RETICCTPCT 2.6   Sepsis Labs: No results for input(s): PROCALCITON, LATICACIDVEN in the last 168 hours.  No results found for this or any previous visit (from the past 240 hours).   Radiology Studies: DG Chest Portable 1 View Result Date: 03/25/2023 CLINICAL DATA:  Shortness of breath. EXAM: PORTABLE CHEST 1 VIEW COMPARISON:  CT 5 days ago, radiograph 10 days ago FINDINGS: Mild hyperinflation with bronchial thickening. No focal airspace disease. The heart is normal in size. Mediastinal contours are normal. No pneumothorax, large pleural effusion, or pulmonary edema. Presumed monitoring device projecting over the left chest. IMPRESSION: Mild hyperinflation with bronchial thickening. No focal airspace disease. Electronically Signed   By: Andrea Gasman M.D.   On: 03/25/2023 21:10   CT Head Wo Contrast Result Date: 03/25/2023 CLINICAL DATA:  Head trauma, minor (Age >= 65y) FOTx1 week ago. EXAM: CT HEAD WITHOUT CONTRAST TECHNIQUE: Contiguous axial images were obtained from the base of the skull through the vertex without intravenous contrast. RADIATION DOSE REDUCTION: This exam was performed  according to the departmental dose-optimization program which includes automated exposure control, adjustment of the mA and/or kV according to patient size and/or use of iterative reconstruction technique. COMPARISON:  None Available. FINDINGS: Brain: No evidence of acute infarction, hemorrhage, hydrocephalus, extra-axial collection or mass lesion/mass effect. Vascular: No hyperdense vessel. Skull: No acute fracture. Sinuses/Orbits: Clear sinuses.  No acute orbital findings. Other: No mastoid effusions. IMPRESSION: No evidence of acute intracranial abnormality. Electronically Signed   By: Gilmore GORMAN Molt M.D.   On: 03/25/2023 19:03    Scheduled Meds:  atorvastatin   20 mg Oral Daily   dronedarone   400 mg Oral BID WC   fluticasone  furoate-vilanterol  1 puff Inhalation Daily   And   umeclidinium bromide   1 puff Inhalation Daily   metoprolol  tartrate  12.5 mg Oral BID   pantoprazole  (PROTONIX ) IV  40 mg Intravenous Q12H   Continuous Infusions:   LOS: 1 day   Fredia Skeeter, MD [facility]  03/27/2023, 11:33 AM   *Please note that this is a verbal dictation therefore any spelling or grammatical errors are due to the Dragon Medical One system interpretation.  Please page via Amion and do not message via secure chat for urgent patient care matters. Secure chat can be used for non urgent patient care matters.  How to contact the [facility] Attending or Consulting provider 7A - 7P or covering provider during after hours 7P -7A, for this patient?  Check the care team in [facility] and look for a) attending/consulting [facility] provider listed and b) the [facility] team listed. Page or secure chat 7A-7P. Log into [email or web address] and use [facility] universal password to access. If you do not have the password, please contact the hospital operator. Locate the [facility] provider you are looking for under [facility] and page to a number that you can be directly reached. If you still have difficulty reaching the  provider, please page the [facility] (Director on Call) for the Hospitalists listed on amion for assistance.

## 2023-03-27 NOTE — Anesthesia Preprocedure Evaluation (Addendum)
 Anesthesia Evaluation  Patient identified by MRN, date of birth, ID band Patient awake    Reviewed: Allergy & Precautions, NPO status , Patient's Chart, lab work & pertinent test results, reviewed documented beta blocker date and time   History of Anesthesia Complications Negative for: history of anesthetic complications  Airway Mallampati: II  TM Distance: >3 FB Neck ROM: Full    Dental  (+) Dental Advisory Given, Partial Lower   Pulmonary Current Smoker and Patient abstained from smoking.   Pulmonary exam normal        Cardiovascular hypertension, Pt. on medications and Pt. on home beta blockers + CAD and + Cardiac Stents  + dysrhythmias Atrial Fibrillation  Rhythm:Regular Rate:Bradycardia   '24 TTE - EF 60 to 65%. Trivial MR and AI     Neuro/Psych negative neurological ROS  negative psych ROS   GI/Hepatic negative GI ROS, Neg liver ROS,,,  Endo/Other  negative endocrine ROS    Renal/GU negative Renal ROS     Musculoskeletal  (+) Arthritis ,    Abdominal   Peds  Hematology  (+) Blood dyscrasia, anemia  On eliquis     Anesthesia Other Findings   Reproductive/Obstetrics                             Anesthesia Physical Anesthesia Plan  ASA: 3  Anesthesia Plan: MAC   Post-op Pain Management: Minimal or no pain anticipated   Induction:   PONV Risk Score and Plan: 0 and Propofol  infusion and Treatment may vary due to age or medical condition  Airway Management Planned: Nasal Cannula and Natural Airway  Additional Equipment: None  Intra-op Plan:   Post-operative Plan:   Informed Consent: I have reviewed the patients History and Physical, chart, labs and discussed the procedure including the risks, benefits and alternatives for the proposed anesthesia with the patient or authorized representative who has indicated his/her understanding and acceptance.       Plan Discussed  with: CRNA and Anesthesiologist  Anesthesia Plan Comments:         Anesthesia Quick Evaluation

## 2023-03-28 ENCOUNTER — Inpatient Hospital Stay (HOSPITAL_COMMUNITY): Payer: Medicare Other | Admitting: Anesthesiology

## 2023-03-28 ENCOUNTER — Encounter (HOSPITAL_COMMUNITY)
Admission: EM | Disposition: A | Discharge status: Home / Self Care | Payer: Self-pay | Source: Home / Self Care | Attending: Family Medicine

## 2023-03-28 DIAGNOSIS — I2510 Atherosclerotic heart disease of native coronary artery without angina pectoris: Secondary | ICD-10-CM

## 2023-03-28 DIAGNOSIS — D509 Iron deficiency anemia, unspecified: Secondary | ICD-10-CM

## 2023-03-28 DIAGNOSIS — K640 First degree hemorrhoids: Secondary | ICD-10-CM

## 2023-03-28 DIAGNOSIS — K5730 Diverticulosis of large intestine without perforation or abscess without bleeding: Secondary | ICD-10-CM

## 2023-03-28 DIAGNOSIS — D649 Anemia, unspecified: Secondary | ICD-10-CM | POA: Diagnosis not present

## 2023-03-28 DIAGNOSIS — K648 Other hemorrhoids: Secondary | ICD-10-CM | POA: Insufficient documentation

## 2023-03-28 HISTORY — PX: COLONOSCOPY: SHX5424

## 2023-03-28 HISTORY — PX: ESOPHAGOGASTRODUODENOSCOPY: SHX5428

## 2023-03-28 LAB — CBC WITH DIFFERENTIAL/PLATELET
Abs Immature Granulocytes: 0.03 10*3/uL (ref 0.00–0.07)
Basophils Absolute: 0 10*3/uL (ref 0.0–0.1)
Basophils Relative: 0 %
Eosinophils Absolute: 0.2 10*3/uL (ref 0.0–0.5)
Eosinophils Relative: 3 %
HCT: 26.1 % — ABNORMAL LOW (ref 39.0–52.0)
Hemoglobin: 8.2 g/dL — ABNORMAL LOW (ref 13.0–17.0)
Immature Granulocytes: 0 %
Lymphocytes Relative: 13 %
Lymphs Abs: 0.9 10*3/uL (ref 0.7–4.0)
MCH: 25.4 pg — ABNORMAL LOW (ref 26.0–34.0)
MCHC: 31.4 g/dL (ref 30.0–36.0)
MCV: 80.8 fL (ref 80.0–100.0)
Monocytes Absolute: 0.7 10*3/uL (ref 0.1–1.0)
Monocytes Relative: 9 %
Neutro Abs: 5.3 10*3/uL (ref 1.7–7.7)
Neutrophils Relative %: 75 %
Platelets: 155 10*3/uL (ref 150–400)
RBC: 3.23 MIL/uL — ABNORMAL LOW (ref 4.22–5.81)
RDW: 16.7 % — ABNORMAL HIGH (ref 11.5–15.5)
WBC: 7.2 10*3/uL (ref 4.0–10.5)
nRBC: 0 % (ref 0.0–0.2)

## 2023-03-28 SURGERY — EGD (ESOPHAGOGASTRODUODENOSCOPY)
Anesthesia: Monitor Anesthesia Care

## 2023-03-28 MED ORDER — FERROUS SULFATE 325 (65 FE) MG PO TABS: 325.0000 mg | ORAL_TABLET | Freq: Two times a day (BID) | ORAL | 0 refills | Status: DC

## 2023-03-28 MED ORDER — PHENYLEPHRINE 80 MCG/ML (10ML) SYRINGE FOR IV PUSH (FOR BLOOD PRESSURE SUPPORT)
PREFILLED_SYRINGE | INTRAVENOUS | Status: DC | PRN
  Administered 2023-03-28 (×2): 120 ug via INTRAVENOUS
  Administered 2023-03-28: 80 ug via INTRAVENOUS
  Administered 2023-03-28: 120 ug via INTRAVENOUS
  Administered 2023-03-28: 80 ug via INTRAVENOUS

## 2023-03-28 MED ORDER — SODIUM CHLORIDE 0.9 % IV SOLN: INTRAVENOUS | Status: DC | PRN

## 2023-03-28 MED ORDER — EPHEDRINE SULFATE-NACL 50-0.9 MG/10ML-% IV SOSY
PREFILLED_SYRINGE | INTRAVENOUS | Status: DC | PRN
  Administered 2023-03-28 (×4): 5 mg via INTRAVENOUS

## 2023-03-28 MED ORDER — PROPOFOL 10 MG/ML IV BOLUS
INTRAVENOUS | Status: DC | PRN
  Administered 2023-03-28: 50 mg via INTRAVENOUS
  Administered 2023-03-28: 80 mg via INTRAVENOUS
  Administered 2023-03-28 (×2): 50 mg via INTRAVENOUS
  Administered 2023-03-28: 30 mg via INTRAVENOUS
  Administered 2023-03-28 (×2): 50 mg via INTRAVENOUS
  Administered 2023-03-28: 30 mg via INTRAVENOUS
  Administered 2023-03-28: 60 mg via INTRAVENOUS
  Administered 2023-03-28: 50 mg via INTRAVENOUS

## 2023-03-28 MED ORDER — LIDOCAINE 2% (20 MG/ML) 5 ML SYRINGE
INTRAMUSCULAR | Status: DC | PRN
  Administered 2023-03-28: 60 mg via INTRAVENOUS

## 2023-03-28 NOTE — Transfer of Care (Signed)
 Immediate Anesthesia Transfer of Care Note  Patient: Julian Weaver  Procedure(s) Performed: ESOPHAGOGASTRODUODENOSCOPY (EGD) COLONOSCOPY  Patient Location: PACU  Anesthesia Type:MAC  Level of Consciousness: drowsy and responds to stimulation  Airway & Oxygen Therapy: Patient Spontanous Breathing and Patient connected to nasal cannula oxygen  Post-op Assessment: Report given to RN, Post -op Vital signs reviewed and stable, and Patient moving all extremities X 4  Post vital signs: Reviewed and stable  Last Vitals:  Vitals Value Taken Time  BP 92/47   Temp 97.2   Pulse 67 03/28/23 1023  Resp 18 03/28/23 1023  SpO2 98 % 03/28/23 1023  Vitals shown include unfiled device data.  Last Pain:  Vitals:   03/28/23 0809  TempSrc: Temporal  PainSc: 0-No pain         Complications: No notable events documented.

## 2023-03-28 NOTE — Op Note (Signed)
 [facility] Patient Name: Julian Weaver Procedure Date : 03/28/2023 MRN: [id number] Attending MD: Estefana Keas DO, DO, [id number] Date of Birth: 11-24-1945 CSN: [id number] Age: 78 Admit Type: Inpatient Procedure:                Upper GI endoscopy Indications:              Unexplained iron deficiency anemia, Occult blood in                            stool Providers:                Estefana Keas DO, DO, Mliss Eagles, RN, Particia Fischer, RN, Farris Southgate, Technician Referring MD:              Medicines:                See the Anesthesia note for documentation of the                            administered medications Complications:            No immediate complications. Estimated Blood Loss:     Estimated blood loss: none. Procedure:                Pre-Anesthesia Assessment:                           - ASA Grade Assessment: III - A patient with severe                            systemic disease.                           - The risks and benefits of the procedure and the                            sedation options and risks were discussed with the                            patient. All questions were answered and informed                            consent was obtained.                           After obtaining informed consent, the endoscope was                            passed under direct vision. Throughout the                            procedure, the patient's blood pressure, pulse, and                            oxygen saturations were monitored continuously.  The                            [id number]-[id number] ([id number]) Olympus endoscope was introduced                            through the mouth, and advanced to the second part                            of duodenum. The upper GI endoscopy was                            accomplished without difficulty. The patient                            tolerated the procedure well. Scope In: Scope  Out: Findings:      The Z-line was regular and was found at the gastroesophageal junction.      The entire examined stomach was normal.      The examined duodenum was normal. Impression:               - Z-line regular, at the gastroesophageal junction.                           - Normal stomach.                           - Normal examined duodenum.                           - No specimens collected. Recommendation:           - Resume previous diet.                           - Continue present medications.                           - Perform a colonoscopy today. Procedure Code(s):        --- Professional ---                           [phone], Esophagogastroduodenoscopy, flexible,                            transoral; diagnostic, including collection of                            specimen(s) by brushing or washing, when performed                            (separate procedure) Diagnosis Code(s):        --- Professional ---                           D50.9, Iron deficiency anemia, unspecified  R19.5, Other fecal abnormalities CPT copyright 2022 [employer]. All rights reserved. The codes documented in this report are preliminary and upon coder review may  be revised to meet current compliance requirements. Dr Estefana Keas, DO Estefana Keas DO, DO 03/28/2023 10:24:12 AM Number of Addenda: 0

## 2023-03-28 NOTE — Discharge Summary (Signed)
 Physician Discharge Summary  Julian Weaver FMW:[id number] DOB: 1945-11-14 DOA: 03/25/2023  PCP: Chrystal Lamarr RAMAN, MD  Admit date: 03/25/2023 Discharge date: 03/28/2023 30 Day Unplanned Readmission Risk Score    Flowsheet Row ED to Hosp-Admission (Current) from 03/25/2023 in [facility]  30 Day Unplanned Readmission Risk Score (%) 14.56 Filed at 03/28/2023 0801       This score is the patient's risk of an unplanned readmission within 30 days of being discharged (0 -100%). The score is based on dignosis, age, lab data, medications, orders, and past utilization.   Low:  0-14.9   Medium: 15-21.9   High: 22-29.9   Extreme: 30 and above          Admitted From: Home Disposition: Home  Recommendations for Outpatient Follow-up:  Follow up with PCP in 1-2 weeks Please obtain BMP/CBC in one week Follow-up with GI/care clinic will call you to schedule capsule endoscopy Please follow up with your PCP on the following pending results: Unresulted Labs (From admission, onward)    None         Home Health: None Equipment/Devices: None  Discharge Condition: Stable CODE STATUS: Full code Diet recommendation: Cardiac  Subjective: Patient seen and examined after EGD and colonoscopy, he was fully alert and oriented and was informed of the results, he had no complaints and he is eager to go home.  Brief/Interim Summary: Julian Weaver is a 78 y.o. male with medical history significant for paroxysmal atrial fibrillation chronically anticoagulated on Eliquis , essential pretension, hyperlipidemia, COPD, who was admitted to [facility] on 03/25/2023 with subacute symptomatic anemia after presenting from home to [facility] ED for further evaluation of low hemoglobin identified on outpatient labs by PCP.    Acute vs Subacute symptomatic anemia: Symptomatic with dyspnea for 2 to 3 weeks.  Hemoglobin 5.8 upon arrival, status post 2 unit of PRBC transfusion,  hemoglobin 8.5 > 8.2 8.0> 8.7> 8.2.  Initial FOBT negative but second was positive so GI was consulted and patient underwent EGD and colonoscopy today and was found to have internal hemorrhoids and colonic diverticulosis but no active source of bleeding.  GI cleared him to go home and recommended resumption of the Eliquis  and that their office will call him to arrange outpatient capsule endoscopy.  This has been discussed with the patient by GI and myself and he verbalized understanding.  He does appear to have some iron deficiency so sending him home on iron tablets.  He is aware that he needs to seek medical attention sooner if he develops symptoms, has an episode of melena, hematochezia or hematemesis.   Paroxysmal atrial fibrillation: CHA2DS2-VASc score of 3, Eliquis  was held during hospitalization but now resuming per GI recommendations.  Resume Multaq  as well.   Essential Hypertension: Blood pressure very well-controlled on home metoprolol .  Amlodipine  on hold.     Hypokalemia: Resolved   Hyponatremia: Resolved.   Hyperlipidemia: Continue atorvastatin .   COPD: Stable, no wheezes.  Cont outpatient Trelegy. Prn albuterol  nebulizer.    Chronic tobacco abuse: Patient conveys that they are a current smoker, having smoked half pack to 1 pack/day over the last 40 years.  I have discussed tobacco cessation with the patient.  I have counseled the patient regarding the negative impacts of continued tobacco use including but not limited to lung cancer, COPD, and cardiovascular disease.  I have discussed alternatives to tobacco and modalities that may help facilitate tobacco cessation including but not limited to biofeedback, hypnosis,  and medications.  Total time spent with tobacco counseling was 5 minutes.  Discharge plan was discussed with patient and/or family member and they verbalized understanding and agreed with it.  Discharge Diagnoses:  Principal Problem:   Symptomatic anemia Active  Problems:   Essential hypertension   Tobacco abuse   Paroxysmal atrial fibrillation (HCC)   Syncope   Postural dizziness   Hyperlipidemia   History of COPD   Acute blood loss anemia   Colon, diverticulosis   Internal hemorrhoids    Discharge Instructions   Allergies as of 03/28/2023       Reactions   Bee Venom Swelling   Amoxicillin-pot Clavulanate Nausea And Vomiting   Severe nausea and vomiting   Hydromorphone     vomiting   Tramadol  Nausea Only        Medication List     STOP taking these medications    EPIPEN IJ   nitroGLYCERIN  0.4 MG SL tablet Commonly known as: NITROSTAT    omeprazole 20 MG capsule Commonly known as: PRILOSEC       TAKE these medications    albuterol  108 (90 Base) MCG/ACT inhaler Commonly known as: VENTOLIN  HFA Inhale 2 puffs into the lungs every 4 (four) hours as needed for shortness of breath.   amLODipine  5 MG tablet Commonly known as: NORVASC  TAKE 1 TABLET(5 MG) BY MOUTH DAILY What changed: See the new instructions.   apixaban  5 MG Tabs tablet Commonly known as: Eliquis  Take 1 tablet (5 mg total) by mouth 2 (two) times daily.   aspirin  EC 81 MG tablet Take 81 mg by mouth daily. Swallow whole.   atorvastatin  20 MG tablet Commonly known as: LIPITOR TAKE 1 TABLET(20 MG) BY MOUTH DAILY What changed: See the new instructions.   Co Q-10 100 MG Caps Take 1 capsule by mouth every morning.   cyanocobalamin  1000 MCG tablet Commonly known as: VITAMIN B12 Take 1,000 mcg by mouth daily.   ferrous sulfate  325 (65 FE) MG tablet Take 1 tablet (325 mg total) by mouth 2 (two) times daily with a meal.   Fluticasone -Umeclidin-Vilant 100-62.5-25 MCG/ACT Aepb Inhale 1 puff into the lungs at bedtime. Not sure the dosage   furosemide 20 MG tablet Commonly known as: LASIX Take 20 mg by mouth daily.   metoprolol  tartrate 25 MG tablet Commonly known as: LOPRESSOR  TAKE 1/2 TABLET BY MOUTH TWICE DAILY   Multaq  400 MG tablet Generic  drug: dronedarone  Take 1 tablet (400 mg total) by mouth 2 (two) times daily with a meal.        Follow-up Information     Kriss Stagger H, DO Follow up in 1 week(s).   Specialty: Gastroenterology Why: [facility] will contact you to schedule video capsule endoscopy. If you have not heard from the office within 2 weeks please contact office to schedule. Contact information: [address] [place] [address] [phone]         Chrystal Lamarr RAMAN, MD Follow up in 1 week(s).   Specialty: Family Medicine Contact information: [address] [place] [address] [phone]                Allergies  Allergen Reactions   Bee Venom Swelling   Amoxicillin-Pot Clavulanate Nausea And Vomiting    Severe nausea and vomiting   Hydromorphone      vomiting   Tramadol  Nausea Only    Consultations: GI   Procedures/Studies: DG Chest Portable 1 View Result Date: 03/25/2023 CLINICAL DATA:  Shortness  of breath. EXAM: PORTABLE CHEST 1 VIEW COMPARISON:  CT 5 days ago, radiograph 10 days ago FINDINGS: Mild hyperinflation with bronchial thickening. No focal airspace disease. The heart is normal in size. Mediastinal contours are normal. No pneumothorax, large pleural effusion, or pulmonary edema. Presumed monitoring device projecting over the left chest. IMPRESSION: Mild hyperinflation with bronchial thickening. No focal airspace disease. Electronically Signed   By: Andrea Gasman M.D.   On: 03/25/2023 21:10   CT Head Wo Contrast Result Date: 03/25/2023 CLINICAL DATA:  Head trauma, minor (Age >= 65y) FOTx1 week ago. EXAM: CT HEAD WITHOUT CONTRAST TECHNIQUE: Contiguous axial images were obtained from the base of the skull through the vertex without intravenous contrast. RADIATION DOSE REDUCTION: This exam was performed according to the departmental dose-optimization program which includes automated exposure control, adjustment of the mA and/or kV according to  patient size and/or use of iterative reconstruction technique. COMPARISON:  None Available. FINDINGS: Brain: No evidence of acute infarction, hemorrhage, hydrocephalus, extra-axial collection or mass lesion/mass effect. Vascular: No hyperdense vessel. Skull: No acute fracture. Sinuses/Orbits: Clear sinuses.  No acute orbital findings. Other: No mastoid effusions. IMPRESSION: No evidence of acute intracranial abnormality. Electronically Signed   By: Gilmore GORMAN Molt M.D.   On: 03/25/2023 19:03   ECHOCARDIOGRAM COMPLETE Result Date: 03/21/2023    ECHOCARDIOGRAM REPORT   Patient Name:   ALAZAR CHERIAN Date of Exam: 03/21/2023 Medical Rec #:  [id number]               Height:       68.0 in Accession #:    [id number]              Weight:       177.0 lb Date of Birth:  04/05/1945               BSA:          1.940 m Patient Age:    77 years                BP:           106/53 mmHg Patient Gender: M                       HR:           112 bpm. Exam Location:  [address] Procedure: 2D Echo, Cardiac Doppler and Color Doppler Indications:    R55 Syncope  History:        Patient has prior history of Echocardiogram examinations, most                 recent 10/31/2020. CAD, Status post PCI/DES RCA, Carotid Disease,                 Arrythmias:Atrial Fibrillation; Risk Factors:Hypertension,                 Dyslipidemia and Current Smoker. Previous echo revealed LVEF 60%                 GLS -23.  Sonographer:    Nolon Berg BA, RDCS Referring Phys: [phone] TRACI R TURNER  Sonographer Comments: Unable to perform LV GLS due to atrial fibrillation. Note sent physician due to atrial fibrillation. IMPRESSIONS  1. Left ventricular ejection fraction, by estimation, is 60 to 65%. The left ventricle has normal function. The left ventricle has no regional wall motion abnormalities. Left ventricular diastolic parameters are indeterminate.  2. Right ventricular systolic function is  normal. The right ventricular size is normal.  3.  The mitral valve is normal in structure. Trivial mitral valve regurgitation. No evidence of mitral stenosis.  4. The aortic valve is tricuspid. Aortic valve regurgitation is trivial. No aortic stenosis is present.  5. The inferior vena cava is normal in size with greater than 50% respiratory variability, suggesting right atrial pressure of 3 mmHg. Comparison(s): No significant change from prior study. Prior images reviewed side by side. FINDINGS  Left Ventricle: Left ventricular ejection fraction, by estimation, is 60 to 65%. The left ventricle has normal function. The left ventricle has no regional wall motion abnormalities. The left ventricular internal cavity size was normal in size. There is  no left ventricular hypertrophy. Left ventricular diastolic parameters are indeterminate. Right Ventricle: The right ventricular size is normal. No increase in right ventricular wall thickness. Right ventricular systolic function is normal. Left Atrium: Left atrial size was normal in size. Right Atrium: Right atrial size was normal in size. Pericardium: There is no evidence of pericardial effusion. Mitral Valve: The mitral valve is normal in structure. Trivial mitral valve regurgitation. No evidence of mitral valve stenosis. Tricuspid Valve: The tricuspid valve is normal in structure. Tricuspid valve regurgitation is mild . No evidence of tricuspid stenosis. Aortic Valve: The aortic valve is tricuspid. Aortic valve regurgitation is trivial. No aortic stenosis is present. Pulmonic Valve: The pulmonic valve was normal in structure. Pulmonic valve regurgitation is trivial. No evidence of pulmonic stenosis. Aorta: The aortic root is normal in size and structure. Venous: The inferior vena cava is normal in size with greater than 50% respiratory variability, suggesting right atrial pressure of 3 mmHg. IAS/Shunts: No atrial level shunt detected by color flow Doppler.  LEFT VENTRICLE PLAX 2D LVIDd:         4.70 cm LVIDs:          3.00 cm LV PW:         1.10 cm LV IVS:        0.80 cm LVOT diam:     2.40 cm LV SV:         72 LV SV Index:   37 LVOT Area:     4.52 cm  RIGHT VENTRICLE             IVC RV Basal diam:  3.80 cm     IVC diam: 2.10 cm RV Mid diam:    2.60 cm RV S prime:     13.58 cm/s TAPSE (M-mode): 2.6 cm LEFT ATRIUM             Index        RIGHT ATRIUM           Index LA diam:        4.40 cm 2.27 cm/m   RA Area:     17.60 cm LA Vol (A2C):   55.3 ml 28.50 ml/m  RA Volume:   48.40 ml  24.94 ml/m LA Vol (A4C):   52.3 ml 26.95 ml/m LA Biplane Vol: 55.0 ml 28.35 ml/m  AORTIC VALVE LVOT Vmax:   102.80 cm/s LVOT Vmean:  67.967 cm/s LVOT VTI:    0.159 m  AORTA Ao Root diam: 3.30 cm Ao Asc diam:  3.30 cm  SHUNTS Systemic VTI:  0.16 m Systemic Diam: 2.40 cm Oneil Parchment MD Electronically signed by Oneil Parchment MD Signature Date/Time: 03/21/2023/11:16:07 AM    Final      Discharge Exam: Vitals:   03/28/23 1030 03/28/23 1040  BP: (!) 102/55 122/66  Pulse: 65 (!) 59  Resp: 19 16  Temp:    SpO2: 95% 96%   Vitals:   03/28/23 0809 03/28/23 1025 03/28/23 1030 03/28/23 1040  BP: (!) 145/58 (!) 92/47 (!) 102/55 122/66  Pulse: (!) 57 65 65 (!) 59  Resp: 16 (!) 24 19 16   Temp: (!) 97.1 F (36.2 C) (!) 97.3 F (36.3 C)    TempSrc: Temporal Temporal    SpO2: 99% 95% 95% 96%  Weight:      Height:        General: Pt is alert, awake, not in acute distress Cardiovascular: RRR, S1/S2 +, no rubs, no gallops Respiratory: CTA bilaterally, no wheezing, no rhonchi Abdominal: Soft, NT, ND, bowel sounds + Extremities: no edema, no cyanosis    The results of significant diagnostics from this hospitalization (including imaging, microbiology, ancillary and laboratory) are listed below for reference.     Microbiology: No results found for this or any previous visit (from the past 240 hours).   Labs: BNP (last 3 results) Recent Labs    03/25/23 1737  BNP 273.5*   Basic Metabolic Panel: Recent Labs  Lab 03/25/23 1610  03/25/23 1619 03/25/23 2036 03/26/23 0530 03/26/23 1614 03/27/23 1139  NA 134* 134*  --  135  --  133*  K 3.7 3.7  --  3.4*  --  4.1  CL 101 101  --  105  --  102  CO2 21*  --   --  22  --  22  GLUCOSE 128* 123*  --  145*  --  90  BUN 15 13  --  11  --  14  CREATININE 1.24 1.20  --  1.07  --  1.13  CALCIUM  7.8*  --   --  8.1*  --  8.3*  MG  --   --  1.8 2.2  --   --   PHOS  --   --   --   --  2.6  --    Liver Function Tests: Recent Labs  Lab 03/25/23 1610 03/26/23 0530  AST 23 21  ALT 20 19  ALKPHOS 73 68  BILITOT 0.5 1.1  PROT 5.6* 5.7*  ALBUMIN 2.9* 2.7*   No results for input(s): LIPASE, AMYLASE in the last 168 hours. No results for input(s): AMMONIA in the last 168 hours. CBC: Recent Labs  Lab 03/26/23 0530 03/26/23 1614 03/27/23 0356 03/27/23 1710 03/28/23 0458  WBC 8.2 8.1 8.0 7.4 7.2  NEUTROABS 6.3 5.8 5.9 5.5 5.3  HGB 8.5* 8.2* 8.0* 8.7* 8.2*  HCT 27.0* 26.6* 25.3* 27.8* 26.1*  MCV 81.8 82.1 81.1 82.0 80.8  PLT 175 163 149* 167 155   Cardiac Enzymes: No results for input(s): CKTOTAL, CKMB, CKMBINDEX, TROPONINI in the last 168 hours. BNP: Invalid input(s): POCBNP CBG: No results for input(s): GLUCAP in the last 168 hours. D-Dimer No results for input(s): DDIMER in the last 72 hours. Hgb A1c No results for input(s): HGBA1C in the last 72 hours. Lipid Profile No results for input(s): CHOL, HDL, LDLCALC, TRIG, CHOLHDL, LDLDIRECT in the last 72 hours. Thyroid  function studies No results for input(s): TSH, T4TOTAL, T3FREE, THYROIDAB in the last 72 hours.  Invalid input(s): FREET3 Anemia work up Recent Labs    03/25/23 2036  VITAMINB12 831  FOLATE 25.0  FERRITIN 6*  TIBC 449  IRON 16*  RETICCTPCT 2.6   Urinalysis    Component Value Date/Time   COLORURINE YELLOW 11/23/2014  1747   APPEARANCEUR CLOUDY (A) 11/23/2014 1747   LABSPEC 1.025 11/23/2014 1747   PHURINE 6.5 11/23/2014 1747   GLUCOSEU  NEGATIVE 11/23/2014 1747   HGBUR TRACE (A) 11/23/2014 1747   BILIRUBINUR NEGATIVE 11/23/2014 1747   KETONESUR NEGATIVE 11/23/2014 1747   PROTEINUR 30 (A) 11/23/2014 1747   UROBILINOGEN 1.0 11/23/2014 1747   NITRITE NEGATIVE 11/23/2014 1747   LEUKOCYTESUR NEGATIVE 11/23/2014 1747   Sepsis Labs Recent Labs  Lab 03/26/23 1614 03/27/23 0356 03/27/23 1710 03/28/23 0458  WBC 8.1 8.0 7.4 7.2   Microbiology No results found for this or any previous visit (from the past 240 hours).  FURTHER DISCHARGE INSTRUCTIONS:   Get Medicines reviewed and adjusted: Please take all your medications with you for your next visit with your Primary MD   Laboratory/radiological data: Please request your Primary MD to go over all hospital tests and procedure/radiological results at the follow up, please ask your Primary MD to get all Hospital records sent to his/her office.   In some cases, they will be blood work, cultures and biopsy results pending at the time of your discharge. Please request that your primary care M.D. goes through all the records of your hospital data and follows up on these results.   Also Note the following: If you experience worsening of your admission symptoms, develop shortness of breath, life threatening emergency, suicidal or homicidal thoughts you must seek medical attention immediately by calling 911 or calling your MD immediately  if symptoms less severe.   You must read complete instructions/literature along with all the possible adverse reactions/side effects for all the Medicines you take and that have been prescribed to you. Take any new Medicines after you have completely understood and accpet all the possible adverse reactions/side effects.    Do not drive when taking Pain medications or sleeping medications (Benzodaizepines)   Do not take more than prescribed Pain, Sleep and Anxiety Medications. It is not advisable to combine anxiety,sleep and pain medications without  talking with your primary care practitioner   Special Instructions: If you have smoked or chewed Tobacco  in the last 2 yrs please stop smoking, stop any regular Alcohol  and or any Recreational drug use.   Wear Seat belts while driving.   Please note: You were cared for by a hospitalist during your hospital stay. Once you are discharged, your primary care physician will handle any further medical issues. Please note that NO REFILLS for any discharge medications will be authorized once you are discharged, as it is imperative that you return to your primary care physician (or establish a relationship with a primary care physician if you do not have one) for your post hospital discharge needs so that they can reassess your need for medications and monitor your lab values  Time coordinating discharge: Over 30 minutes  SIGNED:   Fredia Skeeter, MD  [facility] 03/28/2023, 11:27 AM *Please note that this is a verbal dictation therefore any spelling or grammatical errors are due to the Dragon Medical One system interpretation. If 7PM-7AM, please contact night-coverage [email or web address]

## 2023-03-28 NOTE — Anesthesia Postprocedure Evaluation (Signed)
 Anesthesia Post Note  Patient: Julian Weaver  Procedure(s) Performed: ESOPHAGOGASTRODUODENOSCOPY (EGD) COLONOSCOPY     Patient location during evaluation: PACU Anesthesia Type: MAC Level of consciousness: awake and alert Pain management: pain level controlled Vital Signs Assessment: post-procedure vital signs reviewed and stable Respiratory status: spontaneous breathing, nonlabored ventilation and respiratory function stable Cardiovascular status: stable and blood pressure returned to baseline Anesthetic complications: no   No notable events documented.  Last Vitals:  Vitals:   03/28/23 1030 03/28/23 1040  BP: (!) 102/55 122/66  Pulse: 65 (!) 59  Resp: 19 16  Temp:    SpO2: 95% 96%    Last Pain:  Vitals:   03/28/23 1040  TempSrc:   PainSc: 0-No pain                 Debby FORBES Like

## 2023-03-28 NOTE — Plan of Care (Signed)

## 2023-03-28 NOTE — Interval H&P Note (Signed)
 History and Physical Interval Note:  03/28/2023 9:08 AM  Julian Weaver  has presented today for surgery, with the diagnosis of Symptomatic anemia, FOBT (+).  The various methods of treatment have been discussed with the patient and family. After consideration of risks, benefits and other options for treatment, the patient has consented to  Procedure(s): ESOPHAGOGASTRODUODENOSCOPY (EGD) (N/A) COLONOSCOPY (N/A) as a surgical intervention.  The patient's history has been reviewed, patient examined, no change in status, stable for surgery.  I have reviewed the patient's chart and labs.  Questions were answered to the patient's satisfaction.     Julian Weaver

## 2023-03-28 NOTE — Op Note (Signed)
 [facility] Patient Name: Julian Weaver Procedure Date : 03/28/2023 MRN: [id number] Attending MD: Estefana Keas DO, DO, [id number] Date of Birth: 10/04/1945 CSN: [id number] Age: 78 Admit Type: Inpatient Procedure:                Colonoscopy Indications:              Heme positive stool, Unexplained iron deficiency                            anemia Providers:                Estefana Keas DO, DO, Mliss Eagles, RN, Particia Fischer, RN, Farris Southgate, Technician Referring MD:              Medicines:                See the Anesthesia note for documentation of the                            administered medications Complications:            No immediate complications. Estimated Blood Loss:     Estimated blood loss: none. Procedure:                Pre-Anesthesia Assessment:                           - ASA Grade Assessment: III - A patient with severe                            systemic disease.                           - The risks and benefits of the procedure and the                            sedation options and risks were discussed with the                            patient. All questions were answered and informed                            consent was obtained.                           After obtaining informed consent, the colonoscope                            was passed under direct vision. Throughout the                            procedure, the patient's blood pressure, pulse, and                            oxygen saturations were monitored continuously. The  PCF-HQ190TL ([id number] Olympus peds colonoscope was                            introduced through the anus and advanced to the the                            terminal ileum, with identification of the                            appendiceal orifice and IC valve. The colonoscopy                            was performed without difficulty. The patient                             tolerated the procedure well. The quality of the                            bowel preparation was evaluated using the BBPS                            [facility] Bowel Preparation Scale) with scores of:                            Right Colon = 2 (minor amount of residual staining,                            small fragments of stool and/or opaque liquid, but                            mucosa seen well), Transverse Colon = 3 (entire                            mucosa seen well with no residual staining, small                            fragments of stool or opaque liquid) and Left Colon                            = 2 (minor amount of residual staining, small                            fragments of stool and/or opaque liquid, but mucosa                            seen well). The total BBPS score equals 7. The                            quality of the bowel preparation was good. Scope In: 9:45:14 AM Scope Out: 10:14:17 AM Scope Withdrawal Time: 0 hours 13 minutes 8 seconds  Total Procedure Duration: 0 hours 29 minutes 3 seconds  Findings:      The digital rectal  exam findings include internal hemorrhoids (Grade I).      Multiple medium-mouthed and small-mouthed diverticula were found in the       left colon.      There is no endoscopic evidence of bleeding, inflammation, mass or       polyps in the entire colon.      The terminal ileum appeared normal. Impression:               - Internal hemorrhoids (Grade I) found on digital                            rectal exam.                           - Diverticulosis in the left colon.                           - The examined portion of the ileum was normal.                           - No specimens collected. Recommendation:           - Return patient to hospital ward for possible                            discharge same day.                           - Resume previous diet.                           - Continue present  medications.                           - To visualize the small bowel, perform video                            capsule endoscopy to be scheduled with [facility] as                            outpatient. Procedure Code(s):        --- Professional ---                           [phone], Colonoscopy, flexible; diagnostic, including                            collection of specimen(s) by brushing or washing,                            when performed (separate procedure) Diagnosis Code(s):        --- Professional ---                           K64.0, First degree hemorrhoids                           R19.5, Other  fecal abnormalities                           D50.9, Iron deficiency anemia, unspecified                           K57.30, Diverticulosis of large intestine without                            perforation or abscess without bleeding CPT copyright 2022 American Medical Association. All rights reserved. The codes documented in this report are preliminary and upon coder review may  be revised to meet current compliance requirements. Dr Estefana Keas, DO Estefana Keas DO, DO 03/28/2023 10:30:31 AM Number of Addenda: 0

## 2023-03-31 ENCOUNTER — Other Ambulatory Visit: Payer: Self-pay | Admitting: Cardiology

## 2023-03-31 NOTE — Telephone Encounter (Signed)
 Prescription refill request for Eliquis received. Indication:AFIB Last office visit:9/24 Scr:1.13  1/25 Age: 78 Weight:74.3  KG  PRESCRIPTION REFILLED

## 2023-04-01 ENCOUNTER — Other Ambulatory Visit: Payer: Self-pay

## 2023-04-01 ENCOUNTER — Encounter (HOSPITAL_COMMUNITY): Payer: Self-pay | Admitting: Internal Medicine

## 2023-04-01 DIAGNOSIS — F17210 Nicotine dependence, cigarettes, uncomplicated: Secondary | ICD-10-CM

## 2023-04-01 DIAGNOSIS — Z720 Tobacco use: Secondary | ICD-10-CM | POA: Diagnosis not present

## 2023-04-01 DIAGNOSIS — Z87891 Personal history of nicotine dependence: Secondary | ICD-10-CM

## 2023-04-01 DIAGNOSIS — D649 Anemia, unspecified: Secondary | ICD-10-CM | POA: Diagnosis not present

## 2023-04-01 DIAGNOSIS — J449 Chronic obstructive pulmonary disease, unspecified: Secondary | ICD-10-CM | POA: Diagnosis not present

## 2023-04-01 DIAGNOSIS — Z122 Encounter for screening for malignant neoplasm of respiratory organs: Secondary | ICD-10-CM

## 2023-04-01 DIAGNOSIS — R0602 Shortness of breath: Secondary | ICD-10-CM | POA: Diagnosis not present

## 2023-04-10 DIAGNOSIS — D500 Iron deficiency anemia secondary to blood loss (chronic): Secondary | ICD-10-CM | POA: Diagnosis not present

## 2023-04-17 ENCOUNTER — Encounter (HOSPITAL_BASED_OUTPATIENT_CLINIC_OR_DEPARTMENT_OTHER): Payer: Self-pay | Admitting: Student

## 2023-04-17 ENCOUNTER — Ambulatory Visit (HOSPITAL_BASED_OUTPATIENT_CLINIC_OR_DEPARTMENT_OTHER): Payer: Medicare Other

## 2023-04-17 ENCOUNTER — Ambulatory Visit (HOSPITAL_BASED_OUTPATIENT_CLINIC_OR_DEPARTMENT_OTHER): Payer: Medicare Other | Admitting: Student

## 2023-04-17 ENCOUNTER — Other Ambulatory Visit (HOSPITAL_BASED_OUTPATIENT_CLINIC_OR_DEPARTMENT_OTHER): Payer: Self-pay

## 2023-04-17 DIAGNOSIS — M778 Other enthesopathies, not elsewhere classified: Secondary | ICD-10-CM | POA: Diagnosis not present

## 2023-04-17 DIAGNOSIS — M19012 Primary osteoarthritis, left shoulder: Secondary | ICD-10-CM | POA: Diagnosis not present

## 2023-04-17 DIAGNOSIS — M25512 Pain in left shoulder: Secondary | ICD-10-CM | POA: Diagnosis not present

## 2023-04-17 MED ORDER — TRAMADOL HCL 50 MG PO TABS
50.0000 mg | ORAL_TABLET | Freq: Four times a day (QID) | ORAL | 0 refills | Status: AC | PRN
  Filled 2023-04-17: qty 20, 5d supply, fill #0

## 2023-04-17 NOTE — Progress Notes (Signed)
Chief Complaint: Left shoulder injury     History of Present Illness:    Julian Weaver is a 78 y.o. male here today for evaluation of a left shoulder injury.  Patient reports that 2 days ago he was carrying some paint buckets and slipped on an icy patch causing him to fall.  Believes he may have used his left arm to try to brace his fall.  States that since the injury pain has been worsening, particularly at night, and he rates this at a 9/10.  He has been taking Tylenol without relief.  Pain is mostly over the lateral shoulder and he has extremely limited range of motion.  He is right-hand dominant.  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  He is on anticoagulation with Eliquis.  He is right-hand dominant.   Surgical History:   None  PMH/PSH/Family History/Social History/Meds/Allergies:    Past Medical History:  Diagnosis Date   Allergic rhinitis    Aortic atherosclerosis (HCC)    Arthritis    Carotid artery stenosis    1-39% bilateral dopplers 11/2022   Coronary artery disease 04/13/2008   Drug eluting stent in RCA // Myoview 04/2019: EF 52, normal perfusion; low risk   Hypercholesteremia    Hypertension    PAF (paroxysmal atrial fibrillation) (HCC)    RBBB    Renal disorder    Tobacco abuse    Past Surgical History:  Procedure Laterality Date   CAROTID STENT  2010   COLONOSCOPY N/A 03/28/2023   Procedure: COLONOSCOPY;  Surgeon: Lynann Bologna, DO;  Location: [facility] ENDOSCOPY;  Service: Gastroenterology;  Laterality: N/A;   ESOPHAGOGASTRODUODENOSCOPY N/A 03/28/2023   Procedure: ESOPHAGOGASTRODUODENOSCOPY (EGD);  Surgeon: Lynann Bologna, DO;  Location: [facility] ENDOSCOPY;  Service: Gastroenterology;  Laterality: N/A;   SHOULDER ACROMIOPLASTY  2010   Social History   Socioeconomic History   Marital status: Married    Spouse name: Not on file   Number of children: Not on file   Years of education: Not on file    Highest education level: Not on file  Occupational History   Not on file  Tobacco Use   Smoking status: Every Day    Current packs/day: 1.00    Average packs/day: 1 pack/day for 56.5 years (56.5 ttl pk-yrs)    Types: Cigarettes    Start date: 10/27/1982   Smokeless tobacco: Never   Tobacco comments:    Half-pack daily 11/09/2020  Substance and Sexual Activity   Alcohol use: Yes    Alcohol/week: 1.0 standard drink of alcohol    Types: 1 Cans of beer per week    Comment: Rarely- 3 times a month   Drug use: No   Sexual activity: Not on file  Other Topics Concern   Not on file  Social History Narrative   Not on file   Social Drivers of Health   Financial Resource Strain: Not on file  Food Insecurity: No Food Insecurity (03/26/2023)   Hunger Vital Sign    Worried About Running Out of Food in the Last Year: Never true    Ran Out of Food in the Last Year: Never true  Transportation Needs: No Transportation Needs (03/26/2023)   PRAPARE - Administrator, Civil Service (Medical): No    Lack of Transportation (  Non-Medical): No  Physical Activity: Not on file  Stress: Not on file  Social Connections: Moderately Isolated (03/26/2023)   Social Connection and Isolation Panel [NHANES]    Frequency of Communication with Friends and Family: Twice a week    Frequency of Social Gatherings with Friends and Family: Twice a week    Attends Religious Services: Never    Database administrator or Organizations: No    Attends Engineer, structural: Never    Marital Status: Married   Family History  Problem Relation Age of Onset   Stroke Mother    Stroke Father    Allergies  Allergen Reactions   Bee Venom Swelling   Amoxicillin-Pot Clavulanate Nausea And Vomiting    Severe nausea and vomiting   Hydromorphone     vomiting   Tramadol Nausea Only   Current Outpatient Medications  Medication Sig Dispense Refill   traMADol (ULTRAM) 50 MG tablet Take 1 tablet (50 mg total) by  mouth every 6 (six) hours as needed for up to 5 days. 20 tablet 0   albuterol (VENTOLIN HFA) 108 (90 Base) MCG/ACT inhaler Inhale 2 puffs into the lungs every 4 (four) hours as needed for shortness of breath.     amLODipine (NORVASC) 5 MG tablet TAKE 1 TABLET(5 MG) BY MOUTH DAILY (Patient taking differently: Take 5 mg by mouth daily.) 90 tablet 2   aspirin EC 81 MG tablet Take 81 mg by mouth daily. Swallow whole.     atorvastatin (LIPITOR) 20 MG tablet TAKE 1 TABLET(20 MG) BY MOUTH DAILY (Patient taking differently: Take 20 mg by mouth daily. TAKE 1 TABLET(20 MG) BY MOUTH DAILY) 90 tablet 2   Coenzyme Q10 (CO Q-10) 100 MG CAPS Take 1 capsule by mouth every morning.     cyanocobalamin (VITAMIN B12) 1000 MCG tablet Take 1,000 mcg by mouth daily.     dronedarone (MULTAQ) 400 MG tablet Take 1 tablet (400 mg total) by mouth 2 (two) times daily with a meal. 60 tablet 6   ELIQUIS 5 MG TABS tablet TAKE 1 TABLET(5 MG) BY MOUTH TWICE DAILY 180 tablet 1   ferrous sulfate 325 (65 FE) MG tablet Take 1 tablet (325 mg total) by mouth 2 (two) times daily with a meal. 60 tablet 0   Fluticasone-Umeclidin-Vilant 100-62.5-25 MCG/ACT AEPB Inhale 1 puff into the lungs at bedtime. Not sure the dosage     furosemide (LASIX) 20 MG tablet Take 20 mg by mouth daily.     metoprolol tartrate (LOPRESSOR) 25 MG tablet TAKE 1/2 TABLET BY MOUTH TWICE DAILY 90 tablet 2   Current Facility-Administered Medications  Medication Dose Route Frequency Provider Last Rate Last Admin   triamcinolone acetonide (KENALOG) 10 MG/ML injection 10 mg  10 mg Other Once Asencion Islam, DPM       No results found.  Review of Systems:   A ROS was performed including pertinent positives and negatives as documented in the HPI.  Physical Exam :   Constitutional: NAD and appears stated age Neurological: Alert and oriented Psych: Appropriate affect and cooperative There were no vitals taken for this visit.   Comprehensive Musculoskeletal Exam:     Left shoulder exam demonstrates pinpoint tenderness over the lateral deltoid.  Active range of motion limited to 30 degrees flexion and 20 degrees external rotation compared to 160 degrees flexion and 30 degrees external rotation on contralateral shoulder.  Grip strength intact.  Distal neurosensory exam intact.  Imaging:   Xray (left shoulder 3  views): Mild to moderate AC joint osteoarthritis without evidence of acute fracture or dislocation.   I personally reviewed and interpreted the radiographs.   Assessment:   78 y.o. male with left shoulder pain after a fall 2 days ago.  X-rays are negative for acute bony abnormality however he does have lateral deltoid pain and significant loss of range of motion concerning for traumatic rotator cuff injury.  At this point I would like to obtain an MRI for further evaluation in order to guide treatment plan.  Will plan to start him on tramadol today in order to avoid stronger narcotic pain medicine and he will monitor for any side effects.  Offered sling today but patient declined as he feels that this would make him uncomfortable.  I would like for him to return shortly after MRI to review and discuss treatment plan.  Plan :    -Obtain stat MRI of the left shoulder and return to clinic for review and treatment discussion -Start tramadol 50 mg as needed for moderate to severe pain     I personally saw and evaluated the patient, and participated in the management and treatment plan.  Hazle Nordmann, PA-C Orthopedics

## 2023-04-18 ENCOUNTER — Ambulatory Visit
Admission: RE | Admit: 2023-04-18 | Discharge: 2023-04-18 | Disposition: A | Discharge status: Home / Self Care | Payer: Medicare Other | Source: Ambulatory Visit | Attending: Student | Admitting: Student

## 2023-04-18 DIAGNOSIS — S46012A Strain of muscle(s) and tendon(s) of the rotator cuff of left shoulder, initial encounter: Secondary | ICD-10-CM | POA: Diagnosis not present

## 2023-04-18 DIAGNOSIS — M25512 Pain in left shoulder: Secondary | ICD-10-CM | POA: Diagnosis not present

## 2023-04-18 DIAGNOSIS — G8929 Other chronic pain: Secondary | ICD-10-CM | POA: Diagnosis not present

## 2023-04-24 ENCOUNTER — Other Ambulatory Visit (HOSPITAL_BASED_OUTPATIENT_CLINIC_OR_DEPARTMENT_OTHER): Payer: Self-pay

## 2023-04-24 ENCOUNTER — Ambulatory Visit (HOSPITAL_BASED_OUTPATIENT_CLINIC_OR_DEPARTMENT_OTHER): Payer: Medicare Other | Admitting: Student

## 2023-04-24 DIAGNOSIS — S46812A Strain of other muscles, fascia and tendons at shoulder and upper arm level, left arm, initial encounter: Secondary | ICD-10-CM | POA: Diagnosis not present

## 2023-04-24 DIAGNOSIS — R55 Syncope and collapse: Secondary | ICD-10-CM | POA: Diagnosis not present

## 2023-04-24 MED ORDER — ACETAMINOPHEN 500 MG PO TABS
500.0000 mg | ORAL_TABLET | Freq: Four times a day (QID) | ORAL | 0 refills | Status: AC | PRN
  Filled 2023-04-24: qty 30, 8d supply, fill #0

## 2023-04-24 MED ORDER — OXYCODONE HCL 5 MG PO TABS
5.0000 mg | ORAL_TABLET | ORAL | 0 refills | Status: AC | PRN
  Filled 2023-04-24: qty 20, 4d supply, fill #0

## 2023-04-24 NOTE — Progress Notes (Signed)
Chief Complaint: Left shoulder injury     History of Present Illness:   04/24/23: Patient is here today for MRI follow-up of the left shoulder.  Overall he states little improvement in his symptoms and still has very limited range of motion of his left arm.  Pain levels are still moderate to severe.   Julian Weaver is a 78 y.o. male here today for evaluation of a left shoulder injury.  Patient reports that 2 days ago he was carrying some paint buckets and slipped on an icy patch causing him to fall.  Believes he may have used his left arm to try to brace his fall.  States that since the injury pain has been worsening, particularly at night, and he rates this at a 9/10.  He has been taking Tylenol without relief.  Pain is mostly over the lateral shoulder and he has extremely limited range of motion.  He is right-hand dominant.  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  He is on anticoagulation with Eliquis.  He is right-hand dominant.   Surgical History:   None  PMH/PSH/Family History/Social History/Meds/Allergies:    Past Medical History:  Diagnosis Date   Allergic rhinitis    Aortic atherosclerosis (HCC)    Arthritis    Carotid artery stenosis    1-39% bilateral dopplers 11/2022   Coronary artery disease 04/13/2008   Drug eluting stent in RCA // Myoview 04/2019: EF 52, normal perfusion; low risk   Hypercholesteremia    Hypertension    PAF (paroxysmal atrial fibrillation) (HCC)    RBBB    Renal disorder    Tobacco abuse    Past Surgical History:  Procedure Laterality Date   CAROTID STENT  2010   COLONOSCOPY N/A 03/28/2023   Procedure: COLONOSCOPY;  Surgeon: Lynann Bologna, DO;  Location: [facility] ENDOSCOPY;  Service: Gastroenterology;  Laterality: N/A;   ESOPHAGOGASTRODUODENOSCOPY N/A 03/28/2023   Procedure: ESOPHAGOGASTRODUODENOSCOPY (EGD);  Surgeon: Lynann Bologna, DO;  Location: [facility] ENDOSCOPY;  Service:  Gastroenterology;  Laterality: N/A;   SHOULDER ACROMIOPLASTY  2010   Social History   Socioeconomic History   Marital status: Married    Spouse name: Not on file   Number of children: Not on file   Years of education: Not on file   Highest education level: Not on file  Occupational History   Not on file  Tobacco Use   Smoking status: Every Day    Current packs/day: 1.00    Average packs/day: 1 pack/day for 56.5 years (56.5 ttl pk-yrs)    Types: Cigarettes    Start date: 10/27/1982   Smokeless tobacco: Never   Tobacco comments:    Half-pack daily 11/09/2020  Substance and Sexual Activity   Alcohol use: Yes    Alcohol/week: 1.0 standard drink of alcohol    Types: 1 Cans of beer per week    Comment: Rarely- 3 times a month   Drug use: No   Sexual activity: Not on file  Other Topics Concern   Not on file  Social History Narrative   Not on file   Social Drivers of Health   Financial Resource Strain: Not on file  Food Insecurity: No Food Insecurity (03/26/2023)   Hunger Vital Sign    Worried About Running Out of Food in the Last  Year: Never true    Ran Out of Food in the Last Year: Never true  Transportation Needs: No Transportation Needs (03/26/2023)   PRAPARE - Administrator, Civil Service (Medical): No    Lack of Transportation (Non-Medical): No  Physical Activity: Not on file  Stress: Not on file  Social Connections: Moderately Isolated (03/26/2023)   Social Connection and Isolation Panel [NHANES]    Frequency of Communication with Friends and Family: Twice a week    Frequency of Social Gatherings with Friends and Family: Twice a week    Attends Religious Services: Never    Database administrator or Organizations: No    Attends Engineer, structural: Never    Marital Status: Married   Family History  Problem Relation Age of Onset   Stroke Mother    Stroke Father    Allergies  Allergen Reactions   Bee Venom Swelling   Amoxicillin-Pot  Clavulanate Nausea And Vomiting    Severe nausea and vomiting   Hydromorphone     vomiting   Tramadol Nausea Only   Current Outpatient Medications  Medication Sig Dispense Refill   acetaminophen (TYLENOL) 500 MG tablet Take 1 tablet (500 mg total) by mouth every 6 (six) hours as needed for up to 14 days. 30 tablet 0   oxyCODONE (ROXICODONE) 5 MG immediate release tablet Take 1 tablet (5 mg total) by mouth every 4 (four) hours as needed for severe pain (pain score 7-10) or breakthrough pain. 20 tablet 0   albuterol (VENTOLIN HFA) 108 (90 Base) MCG/ACT inhaler Inhale 2 puffs into the lungs every 4 (four) hours as needed for shortness of breath.     amLODipine (NORVASC) 5 MG tablet TAKE 1 TABLET(5 MG) BY MOUTH DAILY (Patient taking differently: Take 5 mg by mouth daily.) 90 tablet 2   aspirin EC 81 MG tablet Take 81 mg by mouth daily. Swallow whole.     atorvastatin (LIPITOR) 20 MG tablet TAKE 1 TABLET(20 MG) BY MOUTH DAILY (Patient taking differently: Take 20 mg by mouth daily. TAKE 1 TABLET(20 MG) BY MOUTH DAILY) 90 tablet 2   Coenzyme Q10 (CO Q-10) 100 MG CAPS Take 1 capsule by mouth every morning.     cyanocobalamin (VITAMIN B12) 1000 MCG tablet Take 1,000 mcg by mouth daily.     dronedarone (MULTAQ) 400 MG tablet Take 1 tablet (400 mg total) by mouth 2 (two) times daily with a meal. 60 tablet 6   ELIQUIS 5 MG TABS tablet TAKE 1 TABLET(5 MG) BY MOUTH TWICE DAILY 180 tablet 1   ferrous sulfate 325 (65 FE) MG tablet Take 1 tablet (325 mg total) by mouth 2 (two) times daily with a meal. 60 tablet 0   Fluticasone-Umeclidin-Vilant 100-62.5-25 MCG/ACT AEPB Inhale 1 puff into the lungs at bedtime. Not sure the dosage     furosemide (LASIX) 20 MG tablet Take 20 mg by mouth daily.     metoprolol tartrate (LOPRESSOR) 25 MG tablet TAKE 1/2 TABLET BY MOUTH TWICE DAILY 90 tablet 2   Current Facility-Administered Medications  Medication Dose Route Frequency Provider Last Rate Last Admin   triamcinolone  acetonide (KENALOG) 10 MG/ML injection 10 mg  10 mg Other Once Asencion Islam, DPM       No results found.  Review of Systems:   A ROS was performed including pertinent positives and negatives as documented in the HPI.  Physical Exam :   Constitutional: NAD and appears stated age Neurological: Alert and  oriented Psych: Appropriate affect and cooperative There were no vitals taken for this visit.   Comprehensive Musculoskeletal Exam:    Left shoulder exam demonstrates pinpoint tenderness over the lateral deltoid.  Active range of motion limited to 30 degrees flexion and 20 degrees external rotation compared to 160 degrees flexion and 30 degrees external rotation on contralateral shoulder.  Grip strength intact.  Distal neurosensory exam intact.  Imaging:   MRI left shoulder: Full-thickness tear of the supraspinatus tendon with partial tearing noted of the subscapularis.  Moderate glenohumeral and AC joint arthritis.  Tear of the posterior superior labrum.   I personally reviewed and interpreted the radiographs.   Assessment:   78 y.o. male with evidence of a full-thickness supraspinatus tear due to a fall just over 1 week ago.  There is also tearing involving the subscapularis and labrum.  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  Considering his age and degenerative changes within the shoulder, myself and Dr. Steward Drone discussed that he is ultimately a great candidate for reverse shoulder arthroplasty to allow him to regain function utilizing the deltoid opposed to the rotator cuff.  After explanation of the procedure as well as consideration of risks and benefits, patient would like to proceed with surgical intervention.  He is currently on Eliquis which will be resumed after surgery for DVT prophylaxis.  I did send extra strength Tylenol and oxycodone 5 mg for postop pain.  Plan :    -Plan for reverse shoulder arthroplasty with Dr. Steward Drone -CT scan ordered for pre op  measurements -Post op meds sent to pharmacy     I personally saw and evaluated the patient, and participated in the management and treatment plan.  Hazle Nordmann, PA-C Orthopedics

## 2023-04-25 ENCOUNTER — Telehealth: Payer: Self-pay | Admitting: *Deleted

## 2023-04-25 ENCOUNTER — Ambulatory Visit
Admission: RE | Admit: 2023-04-25 | Discharge: 2023-04-25 | Disposition: A | Discharge status: Home / Self Care | Payer: Medicare Other | Source: Ambulatory Visit | Attending: Student

## 2023-04-25 DIAGNOSIS — M19012 Primary osteoarthritis, left shoulder: Secondary | ICD-10-CM | POA: Diagnosis not present

## 2023-04-25 DIAGNOSIS — M25512 Pain in left shoulder: Secondary | ICD-10-CM | POA: Diagnosis not present

## 2023-04-25 DIAGNOSIS — S46812A Strain of other muscles, fascia and tendons at shoulder and upper arm level, left arm, initial encounter: Secondary | ICD-10-CM

## 2023-04-25 NOTE — Telephone Encounter (Signed)
   Name: Julian Weaver  DOB: September 11, 1945  MRN: [id number]  Primary Cardiologist: Armanda Magic, MD  Chart reviewed as part of pre-operative protocol coverage. Because of Julian Weaver's past medical history and time since last visit, he will require a follow-up in-office visit in order to better assess preoperative cardiovascular risk. Pt was just discharged from the hospital in 03/2023.   Pre-op covering staff: - Please schedule appointment and call patient to inform them. If patient already had an upcoming appointment within acceptable timeframe, please add "pre-op clearance" to the appointment notes so provider is aware. - Please contact requesting surgeon's office via preferred method (i.e, phone, fax) to inform them of need for appointment prior to surgery.  This message will also be routed to pharmacy pool  for input on holding Eliquis as requested below so that this information is available to the clearing provider at time of patient's appointment.   Regarding ASA therapy, we recommend continuation of ASA throughout the perioperative period.  However, if the surgeon feels that cessation of ASA is required in the perioperative period, it may be stopped 5-7 days prior to surgery with a plan to resume it as soon as felt to be feasible from a surgical standpoint in the post-operative period.   Joylene Grapes, NP  04/25/2023, 9:45 AM

## 2023-04-25 NOTE — Telephone Encounter (Signed)
   Pre-operative Risk Assessment    Patient Name: Julian Weaver  DOB: 18-Jan-1946 MRN: [id number]   Date of last office visit: 11/29/2022 Date of next office visit: None   Request for Surgical Clearance    Procedure:   Left reverse shoulder arthroplasty  Date of Surgery:  Clearance TBD                                 Surgeon:  Dr. Huel Cote Surgeon's Group or Practice Name:  [facility] Phone number:  [phone] Fax number:  [phone]   Type of Clearance Requested:   - Medical  - Pharmacy:  Hold Aspirin and Apixaban (Eliquis) Aspirin 7 days and Eliquis 3 days prior.   Type of Anesthesia:  General    Additional requests/questions:    Signed, Emmit Pomfret   04/25/2023, 9:20 AM

## 2023-04-28 ENCOUNTER — Telehealth: Payer: Self-pay

## 2023-04-28 NOTE — Progress Notes (Signed)
 Cardiology Office Note    Patient Name: Julian Weaver Date of Encounter: 04/28/2023  Primary Care Provider:  Chrystal Lamarr RAMAN, MD Primary Cardiologist:  Wilbert Bihari, MD Primary Electrophysiologist: None   Past Medical History    Past Medical History:  Diagnosis Date   Allergic rhinitis    Aortic atherosclerosis (HCC)    Arthritis    Carotid artery stenosis    1-39% bilateral dopplers 11/2022   Coronary artery disease 04/13/2008   Drug eluting stent in RCA // Myoview  04/2019: EF 52, normal perfusion; low risk   Hypercholesteremia    Hypertension    PAF (paroxysmal atrial fibrillation) (HCC)    RBBB    Renal disorder    Tobacco abuse     History of Present Illness  Julian Weaver is a 78 y.o. male with a PMH of CAD s/p PCI/DES to RCA in 2010, HLD, HTN, tobacco abuse, carotid stenosis, paroxysmal AF (on Eliquis ), right subclavian stenosis, aortic atherosclerosis, RBBB who presents today for preoperative clearance.  Julian Weaver has been followed by Dr. Bihari since 2014 for management of CAD and atrial fibrillation.  He has a history of remote PCI to RCA in 2010.  He completed TTE in 2019 that showed an EF of 60 to 65% with no RWMA.  He was seen by Dr. Bihari in 2020 after calling with complaint of dizziness and an event monitor was ordered that showed atrial fibrillation.  He was referred to the AF clinic and seen by Dr. Dow, NP and continued Eliquis  and metoprolol . He underwent a exercise Myoview  that was normal and low risk.  He was started on Multaq  and tolerated well. He was referred to Dr. Kelsie in 2022 for management of AF. He was offered AF ablation but deferred and decided to stay on Multaq . He reported some right arm numbness and weakness and underwent Doppler that showed subclavian artery stenosis and subsequent arterial duplex showed no obstruction. He was last seen by Dr. Bihari on 11/29/2022 and reported ongoing dizziness as he describes as  feeling off balance.  He denied any complaints of chest pain and noted resolution to right arm numbness and pain.  He contacted the office on 03/10/2023 with syncope. He wore a 14-day Zio patch that showed high AF burden and was referred back to the AF clinic for evaluation. He completed a TTE on 03/21/2023 that showed EF of 60 to 65% with no RWMA and trivial MVR.  He was seen in the ED on 03/25/2023 with complaint of dizziness and fatigue and was found to have symptomatic anemia.  He was treated with 2 units of PRBC and had a positive FOBT and patient underwent EGD and colonoscopy with internal hemorrhoids and colonic diverticulosis noted.  Julian Weaver presents for a follow-up visit. The patient reports persistent dizziness and occasional numbness in the right arm, which has not improved since the last visit in September. The patient had a recent hospitalization in January due to a significant drop in blood levels. During the hospital stay, the patient underwent a series of tests including a colonoscopy, endoscopy, MRI, and a spawning test involving swallowing a camera. The patient also reports a fainting episode while eating, which led to the wearing of a vent monitor. The monitor revealed a high burden of AFib, and the patient is aware of the recommendation to follow up with the AFib clinic. The patient is scheduled for a shoulder procedure with Dr. Elspeth Love and is currently on Eliquis  and aspirin ,  which may need to be held prior to the procedure. The patient is also taking iron supplements twice a day for anemia.   Patient denies chest pain, palpitations, dyspnea, PND, orthopnea, nausea, vomiting, dizziness, syncope, edema, weight gain, or early satiety.  Review of Systems  Please see the history of present illness.    All other systems reviewed and are otherwise negative except as noted above.  Physical Exam    Wt Readings from Last 3 Encounters:  03/28/23 163 lb 11.2 oz (74.3 kg)   11/29/22 177 lb (80.3 kg)  11/06/22 176 lb 3.2 oz (79.9 kg)   CD:Uyzmz were no vitals filed for this visit.,There is no height or weight on file to calculate BMI. GEN: Well nourished, well developed in no acute distress Neck: No JVD; No carotid bruits Pulmonary: Clear to auscultation without rales, wheezing or rhonchi  Cardiovascular: Normal rate. Regular rhythm. Normal S1. Normal S2.   Murmurs: There is no murmur.  ABDOMEN: Soft, non-tender, non-distended EXTREMITIES:  No edema; No deformity   EKG/LABS/ Recent Cardiac Studies   ECG personally reviewed by me today -sinus rhythm with RBBB and rate of 61 bpm with no acute changes consistent with previous EKG.  Risk Assessment/Calculations:    CHA2DS2-VASc Score = 4   This indicates a 4.8% annual risk of stroke. The patient's score is based upon: CHF History: 0 HTN History: 1 Diabetes History: 0 Stroke History: 0 Vascular Disease History: 1 Age Score: 2 Gender Score: 0         Lab Results  Component Value Date   WBC 7.2 03/28/2023   HGB 8.2 (L) 03/28/2023   HCT 26.1 (L) 03/28/2023   MCV 80.8 03/28/2023   PLT 155 03/28/2023   Lab Results  Component Value Date   CREATININE 1.13 03/27/2023   BUN 14 03/27/2023   NA 133 (L) 03/27/2023   K 4.1 03/27/2023   CL 102 03/27/2023   CO2 22 03/27/2023   Lab Results  Component Value Date   CHOL 88 (L) 01/24/2023   HDL 41 01/24/2023   LDLCALC 32 01/24/2023   TRIG 70 01/24/2023   CHOLHDL 2.1 01/24/2023    No results found for: HGBA1C Assessment & Plan    1.  Preoperative clearance: -Patient's RCRI score is 6.6% The patient affirms he has been doing well without any new cardiac symptoms. They are able to achieve 7 METS without cardiac limitations. Therefore, based on ACC/AHA guidelines, the patient would be at acceptable risk for the planned procedure without further cardiovascular testing. The patient was advised that if he develops new symptoms prior to surgery to  contact our office to arrange for a follow-up visit, and he verbalized understanding.   -Patient can hold ASA 81 mg 5 to 7 days prior to procedure and should restart postprocedure when surgically safe.   2.  Paroxysmal AF: -Recent high burden of AFib noted on event monitor. Currently in sinus rhythm on today's EKG. Patient is aware of symptoms when in AFib. -Continue Multaq  40 mg twice daily and metoprolol  12.5 mg twice daily -Hemoglobin was 9.7 and creatinine was 1.1 -Continue Eliquis  5 mg twice daily -Continue current management and follow-up with AFib clinic on 05/06/2023.  3.  Essential hypertension: -Patient's blood pressure today was stable at 140/70 -Continue current Norvasc  5 mg, Lopressor  12.5 mg twice daily  4.  History of CAD: -s/p CAD s/p PCI/DES to RCA in 2010 -Today patient reports no chest pain or angina. -Continue current GDMT with Lipitor  20 mg daily, ASA 81 mg  5. Anemia: Recent hospitalization for anemia requiring blood transfusion and extensive workup including colonoscopy, endoscopy, and capsule endoscopy. Plan for further evaluation by Hematology. -Continue Iron 325mg  twice daily.  Disposition: Follow-up with Wilbert Bihari, MD or APP in 6 months    Signed, Wyn Raddle, Jackee Shove, NP 04/28/2023, 7:11 PM [facility]

## 2023-04-28 NOTE — Telephone Encounter (Signed)
Pt has been scheduled to see Robin Searing, NP, 04/29/23 3:35, clearance will be addressed at that time.  Will route to requesting surgeon's office to make them aware.

## 2023-04-28 NOTE — Telephone Encounter (Signed)
-----   Message from Armanda Magic sent at 04/25/2023  9:34 AM EST ----- Patient is having significant A-fib burden.  Please get him into A-fib clinic next week.  Also find out if he has resumed his Eliquis

## 2023-04-28 NOTE — Telephone Encounter (Signed)
Call to patient to discuss afib burden found on home heart monitor. No answer, left message per DPR explaining significant afib on monitor. Advised that Dr. Mayford Knife recommends referral to afib clinic, asked patient to call our office to confirm he is still on eliquis.

## 2023-04-29 ENCOUNTER — Encounter: Payer: Self-pay | Admitting: Nurse Practitioner

## 2023-04-29 ENCOUNTER — Ambulatory Visit: Payer: Medicare Other | Attending: Nurse Practitioner | Admitting: Nurse Practitioner

## 2023-04-29 VITALS — BP 140/70 | HR 61 | Ht 68.0 in | Wt 174.0 lb

## 2023-04-29 DIAGNOSIS — I2510 Atherosclerotic heart disease of native coronary artery without angina pectoris: Secondary | ICD-10-CM

## 2023-04-29 DIAGNOSIS — I480 Paroxysmal atrial fibrillation: Secondary | ICD-10-CM | POA: Diagnosis not present

## 2023-04-29 DIAGNOSIS — Z01810 Encounter for preprocedural cardiovascular examination: Secondary | ICD-10-CM | POA: Diagnosis not present

## 2023-04-29 DIAGNOSIS — I10 Essential (primary) hypertension: Secondary | ICD-10-CM | POA: Diagnosis not present

## 2023-04-29 DIAGNOSIS — D62 Acute posthemorrhagic anemia: Secondary | ICD-10-CM | POA: Diagnosis not present

## 2023-04-29 MED ORDER — FERROUS SULFATE 325 (65 FE) MG PO TABS: 325.0000 mg | ORAL_TABLET | Freq: Two times a day (BID) | ORAL | 11 refills | Status: AC

## 2023-04-29 NOTE — Telephone Encounter (Signed)
 Pharmacy please advise on holding Eliquis prior to left reverse shoulder arthroplasty scheduled for TBD. Thank you.

## 2023-04-29 NOTE — Patient Instructions (Addendum)
Medication Instructions:   Your physician recommends that you continue on your current medications as directed. Please refer to the Current Medication list given to you today.   *If you need a refill on your cardiac medications before your next appointment, please call your pharmacy*   Lab Work:  None ordered.  If you have labs (blood work) drawn today and your tests are completely normal, you will receive your results only by: MyChart Message (if you have MyChart) OR A paper copy in the mail If you have any lab test that is abnormal or we need to change your treatment, we will call you to review the results.   Testing/Procedures:  None ordered.   Follow-Up: At [facility], you and your health needs are our priority.  As part of our continuing mission to provide you with exceptional heart care, we have created designated Provider Care Teams.  These Care Teams include your primary Cardiologist (physician) and Advanced Practice Providers (APPs -  Physician Assistants and Nurse Practitioners) who all work together to provide you with the care you need, when you need it.  We recommend signing up for the patient portal called "MyChart".  Sign up information is provided on this After Visit Summary.  MyChart is used to connect with patients for Virtual Visits (Telemedicine).  Patients are able to view lab/test results, encounter notes, upcoming appointments, etc.  Non-urgent messages can be sent to your provider as well.   To learn more about what you can do with MyChart, go to [email or web address].    Your next appointment:   6 month(s)  Provider:   Armanda Magic, MD     Other Instructions  Your physician wants you to follow-up in: 6 months.  You will receive a reminder letter in the mail two months in advance. If you don't receive a letter, please call our office to schedule the follow-up appointment.    1st Floor: - Lobby - Registration  - Pharmacy  - Lab -  Cafe  2nd Floor: - PV Lab - Diagnostic Testing (echo, CT, nuclear med)  3rd Floor: - Vacant  4th Floor: - TCTS (cardiothoracic surgery) - AFib Clinic - Structural Heart Clinic - Vascular Surgery  - Vascular Ultrasound  5th Floor: - HeartCare Cardiology (general and EP) - Clinical Pharmacy for coumadin, hypertension, lipid, weight-loss medications, and med management appointments    Valet parking services will be available as well.    Adopting a Healthy Lifestyle.   Weight: Know what a healthy weight is for you (roughly BMI <25) and aim to maintain this. You can calculate your body mass index on your smart phone. Unfortunately, this is not the most accurate measure of healthy weight, but it is the simplest measurement to use. A more accurate measurement involves body scanning which measures lean muscle, fat tissue and bony density. We do not have this equipment at [facility].    Diet: Aim for 7+ servings of fruits and vegetables daily Limit animal fats in diet for cholesterol and heart health - choose grass fed whenever available Avoid highly processed foods (fast food burgers, tacos, fried chicken, pizza, hot dogs, french fries)  Saturated fat comes in the form of butter, lard, coconut oil, margarine, partially hydrogenated oils, and fat in meat. These increase your risk of cardiovascular disease.  Use healthy plant oils, such as olive, canola, soy, corn, sunflower and peanut.  Whole foods such as fruits, vegetables and whole grains have fiber  Men need > 38 grams of  fiber per day Women need > 25 grams of fiber per day  Load up on vegetables and fruits - one-half of your plate: Aim for color and variety, and remember that potatoes dont count. Go for whole grains - one-quarter of your plate: Whole wheat, barley, wheat berries, quinoa, oats, brown rice, and foods made with them. If you want pasta, go with whole wheat pasta. Protein power - one-quarter of your plate: Fish, chicken,  beans, and nuts are all healthy, versatile protein sources. Limit red meat. You need carbohydrates for energy! The type of carbohydrate is more important than the amount. Choose carbohydrates such as vegetables, fruits, whole grains, beans, and nuts in the place of white rice, white pasta, potatoes (baked or fried), macaroni and cheese, cakes, cookies, and donuts.  If youre thirsty, drink water. Coffee and tea are good in moderation, but skip sugary drinks and limit milk and dairy products to one or two daily servings. Keep sugar intake at 6 teaspoons or 24 grams or LESS       Exercise: Aim for 150 min of moderate intensity exercise weekly for heart health, and weights twice weekly for bone health Stay active - any steps are better than no steps! Aim for 7-9 hours of sleep daily

## 2023-05-01 NOTE — Telephone Encounter (Signed)
 Patient with diagnosis of afib on Eliquis  for anticoagulation.    Procedure: Left reverse shoulder arthroplasty  Date of procedure: TBD   CHA2DS2-VASc Score = 4   This indicates a 4.8% annual risk of stroke. The patient's score is based upon: CHF History: 0 HTN History: 1 Diabetes History: 0 Stroke History: 0 Vascular Disease History: 1 Age Score: 2 Gender Score: 0      CrCl 63 ml/min Platelet count 155  Per office protocol, patient can hold Eliquis  for 3 days prior to procedure.    **This guidance is not considered finalized until pre-operative APP has relayed final recommendations.**

## 2023-05-02 NOTE — Telephone Encounter (Signed)
     Primary Cardiologist: Wilbert Bihari, MD  Chart reviewed as part of pre-operative protocol coverage. Given past medical history and time since last visit, based on ACC/AHA guidelines, Julian Weaver would be at acceptable risk for the planned procedure without further cardiovascular testing.   -Patient's RCRI score is 6.6% The patient affirms he has been doing well without any new cardiac symptoms. They are able to achieve 7 METS without cardiac limitations. Therefore, based on ACC/AHA guidelines, the patient would be at acceptable risk for the planned procedure without further cardiovascular testing. The patient was advised that if he develops new symptoms prior to surgery to contact our office to arrange for a follow-up visit, and he verbalized understanding.    -Patient can hold ASA 81 mg 5 to 7 days prior to procedure and should restart postprocedure when surgically safe.    Patient with diagnosis of afib on Eliquis  for anticoagulation.     Procedure: Left reverse shoulder arthroplasty  Date of procedure: TBD     CHA2DS2-VASc Score = 4   This indicates a 4.8% annual risk of stroke. The patient's score is based upon: CHF History: 0 HTN History: 1 Diabetes History: 0 Stroke History: 0 Vascular Disease History: 1 Age Score: 2 Gender Score: 0       CrCl 63 ml/min Platelet count 155   Per office protocol, patient can hold Eliquis  for 3 days prior to procedure.  I will route this recommendation to the requesting party via Epic fax function and remove from pre-op pool.  Please call with questions.  [name]. Landree Fernholz NP-C     05/02/2023, 8:08 AM [facility] [address] Office [phone] Fax [phone]'

## 2023-05-05 ENCOUNTER — Other Ambulatory Visit: Payer: Self-pay | Admitting: *Deleted

## 2023-05-05 ENCOUNTER — Other Ambulatory Visit: Payer: Self-pay | Admitting: Physician Assistant

## 2023-05-05 DIAGNOSIS — D509 Iron deficiency anemia, unspecified: Secondary | ICD-10-CM

## 2023-05-05 DIAGNOSIS — D62 Acute posthemorrhagic anemia: Secondary | ICD-10-CM

## 2023-05-05 NOTE — Progress Notes (Signed)
[facility] [phone]   [phone]) [phone]  CONSULT NOTE  REFERRING PHYSICIAN: Dr. Lorenso Quarry   REASON FOR CONSULTATION:  Anemia   HPI Julian Weaver is a 78 y.o. male with a past medical significant for for COPD, HLD, hemorrhoid, atrial fibrillation (on eliquis), and hypertension is referred to the clinic for anemia.  The patient was being seen by gastroenterology and had evaluation of his anemia.  The oldest records available to me are from 2010. The anemia started in December 2024 when the patient presented to the ER (after having labs performed at his PCPs office) with severe new onset anemia with a Hbg of 5.8 compared to Hbg of 11.5 6 months prior on 11/06/22.  He was admitted for symptomatic anemia and treated with blood transfusions. He also was started on oral iron supplements as his iron was low at 19, her saturation was low at 5%, and her ferritin was low at 6. He then was seen by Dr. Lorenso Quarry from gastroenterology during his admission and work up included EGD and colonoscopy which was performed on 03/28/23.   The patient also had a capsule endoscopy.  I do not have the results but the phone notes indicate that this was negative for etiology of his heme positive stool besides having internal hemorrhoids.  Therefore, the patient was referred to the clinic for supportive care.  He denies any other history of nutritional deficiencies.  He started the iron supplement 6 weeks ago when he was in the hospital and is compliant.  He tolerates this well without any GI upset.    Regarding symptoms, he reports feeling "pretty good" at this time.  Before when he was symptomatic, he is having dyspnea on exertion and fatigue. He denies any history of bariatric surgery. Denies history of stomach ulcers. He does not use NSAIDs. Denies changes in his bowel habits or constipation.  Denies any unusual bleeding except he may have very mild epistaxis approximately twice  per month which is self-limiting. He denies any other bleeding such as gingival bleeding, hemoptysis, hematemesis, melena, or hematochezia.  Denies any chills, night sweats,  unexplained weight loss, or lymphadenopathy.  Denies any chronic kidney disease.  He denies any particular dietary habits such as being a vegan or vegetarian. He estimates that he eats red meat 1x per week.   His family history consist of a mother who had a stroke.  His father also had a stroke.  His brothers both have A-fib.  He denies any family history of any bone marrow disorders, blood disorders, or malignancies.  The patient works in [employer].  He is married.  He has 3 children.  He is a current smoker having smoked for approximately 30 years averaging half a pack of cigarettes per day.  He denies any alcohol or drug use.     HPI  Past Medical History:  Diagnosis Date   Allergic rhinitis    Aortic atherosclerosis (HCC)    Arthritis    Carotid artery stenosis    1-39% bilateral dopplers 11/2022   Coronary artery disease 04/13/2008   Drug eluting stent in RCA // Myoview 04/2019: EF 52, normal perfusion; low risk   Hypercholesteremia    Hypertension    PAF (paroxysmal atrial fibrillation) (HCC)    RBBB    Renal disorder    Tobacco abuse     Past Surgical History:  Procedure Laterality Date   CAROTID STENT  2010   COLONOSCOPY N/A 03/28/2023  Procedure: COLONOSCOPY;  Surgeon: Lynann Bologna, DO;  Location: [facility] ENDOSCOPY;  Service: Gastroenterology;  Laterality: N/A;   ESOPHAGOGASTRODUODENOSCOPY N/A 03/28/2023   Procedure: ESOPHAGOGASTRODUODENOSCOPY (EGD);  Surgeon: Lynann Bologna, DO;  Location: [facility] ENDOSCOPY;  Service: Gastroenterology;  Laterality: N/A;   SHOULDER ACROMIOPLASTY  2010    Family History  Problem Relation Age of Onset   Stroke Mother    Stroke Father     Social History Social History   Tobacco Use   Smoking status: Every Day    Current packs/day: 1.00    Average packs/day: 1  pack/day for 56.5 years (56.5 ttl pk-yrs)    Types: Cigarettes    Start date: 10/27/1982   Smokeless tobacco: Never   Tobacco comments:    Half-pack daily 11/09/2020  Substance Use Topics   Alcohol use: Yes    Alcohol/week: 1.0 standard drink of alcohol    Types: 1 Cans of beer per week    Comment: Rarely- 3 times a month   Drug use: No    Allergies  Allergen Reactions   Bee Venom Swelling   Amoxicillin-Pot Clavulanate Nausea And Vomiting    Severe nausea and vomiting   Hydromorphone     vomiting   Tramadol Nausea Only    Current Outpatient Medications  Medication Sig Dispense Refill   acetaminophen (TYLENOL) 500 MG tablet Take 1 tablet (500 mg total) by mouth every 6 (six) hours as needed for up to 14 days. 30 tablet 0   albuterol (VENTOLIN HFA) 108 (90 Base) MCG/ACT inhaler Inhale 2 puffs into the lungs every 4 (four) hours as needed for shortness of breath.     amLODipine (NORVASC) 5 MG tablet TAKE 1 TABLET(5 MG) BY MOUTH DAILY 90 tablet 2   aspirin EC 81 MG tablet Take 81 mg by mouth daily. Swallow whole.     atorvastatin (LIPITOR) 20 MG tablet TAKE 1 TABLET(20 MG) BY MOUTH DAILY 90 tablet 2   Coenzyme Q10 (CO Q-10) 100 MG CAPS Take 1 capsule by mouth every morning.     cyanocobalamin (VITAMIN B12) 1000 MCG tablet Take 1,000 mcg by mouth daily.     dronedarone (MULTAQ) 400 MG tablet Take 1 tablet (400 mg total) by mouth 2 (two) times daily with a meal. 60 tablet 6   ELIQUIS 5 MG TABS tablet TAKE 1 TABLET(5 MG) BY MOUTH TWICE DAILY 180 tablet 1   ferrous sulfate 325 (65 FE) MG tablet Take 1 tablet (325 mg total) by mouth 2 (two) times daily with a meal. 60 tablet 11   Fluticasone-Umeclidin-Vilant 100-62.5-25 MCG/ACT AEPB Inhale 1 puff into the lungs at bedtime. Not sure the dosage     furosemide (LASIX) 20 MG tablet Take 20 mg by mouth daily.     metoprolol tartrate (LOPRESSOR) 25 MG tablet TAKE 1/2 TABLET BY MOUTH TWICE DAILY (Patient taking differently: Take 25 mg by mouth 2  (two) times daily.) 90 tablet 2   oxyCODONE (ROXICODONE) 5 MG immediate release tablet Take 1 tablet (5 mg total) by mouth every 4 (four) hours as needed for severe pain (pain score 7-10) or breakthrough pain. 20 tablet 0   Current Facility-Administered Medications  Medication Dose Route Frequency Provider Last Rate Last Admin   triamcinolone acetonide (KENALOG) 10 MG/ML injection 10 mg  10 mg Other Once Asencion Islam, DPM        REVIEW OF SYSTEMS:   Review of Systems  Constitutional: Improving fatigue. Negative for appetite change, chills, fever and unexpected weight  change.  HENT: Mild self limiting nose bleeding 2x per month. Negative for mouth sores, nosebleeds, sore throat and trouble swallowing.   Eyes: Negative for eye problems and icterus.  Respiratory: Negative for cough, hemoptysis, shortness of breath and wheezing.   Cardiovascular: Negative for chest pain and leg swelling.  Gastrointestinal: Negative for abdominal pain, constipation, diarrhea, nausea and vomiting.  Genitourinary: Negative for bladder incontinence, difficulty urinating, dysuria, frequency and hematuria.   Musculoskeletal: Negative for back pain, gait problem, neck pain and neck stiffness.  Skin: Negative for itching and rash.  Neurological: Negative for dizziness, extremity weakness, gait problem, headaches, light-headedness and seizures.  Hematological: Negative for adenopathy. Does not bruise/bleed easily.  Psychiatric/Behavioral: Negative for confusion, depression and sleep disturbance. The patient is not nervous/anxious.     PHYSICAL EXAMINATION:  Blood pressure (!) 141/72, pulse 65, temperature (!) 97.4 F (36.3 C), temperature source Temporal, resp. rate 13, weight 173 lb 12.8 oz (78.8 kg), SpO2 96%.  ECOG PERFORMANCE STATUS: 1  Physical Exam  Constitutional: Oriented to person, place, and time and well-developed, well-nourished, and in no distress.  HENT:  Head: Normocephalic and atraumatic.   Mouth/Throat: Oropharynx is clear and moist. No oropharyngeal exudate.  Eyes: Conjunctivae are normal. Right eye exhibits no discharge. Left eye exhibits no discharge. No scleral icterus.  Neck: Normal range of motion. Neck supple.  Cardiovascular: Normal rate, regular rhythm, normal heart sounds and intact distal pulses.   Pulmonary/Chest: Effort normal. Rhonchi which cleared with coughing. No respiratory distress. No wheezes. No rales.  Abdominal: Soft. Bowel sounds are normal. Exhibits no distension and no mass. There is no tenderness.  Musculoskeletal: Normal range of motion. Exhibits no edema.  Lymphadenopathy:    No cervical adenopathy.  Neurological: Alert and oriented to person, place, and time. Exhibits normal muscle tone. Gait normal. Coordination normal.  Skin: Skin is warm and dry. No rash noted. Not diaphoretic. No erythema. No pallor.  Psychiatric: Mood, memory and judgment normal.  Vitals reviewed.  LABORATORY DATA: Lab Results  Component Value Date   WBC 14.1 (H) 05/06/2023   HGB 12.3 (L) 05/06/2023   HCT 39.2 05/06/2023   MCV 87.3 05/06/2023   PLT 241 05/06/2023      Chemistry      Component Value Date/Time   NA 137 05/06/2023 1324   NA 140 03/20/2020 0831   K 3.9 05/06/2023 1324   CL 103 05/06/2023 1324   CO2 28 05/06/2023 1324   BUN 14 05/06/2023 1324   BUN 13 03/20/2020 0831   CREATININE 1.06 05/06/2023 1324      Component Value Date/Time   CALCIUM 9.0 05/06/2023 1324   ALKPHOS 87 05/06/2023 1324   AST 18 05/06/2023 1324   ALT 15 05/06/2023 1324   BILITOT 0.7 05/06/2023 1324       RADIOGRAPHIC STUDIES: CT SHOULDER LEFT WO CONTRAST Result Date: 05/03/2023 CLINICAL DATA:  Left shoulder pain, limited range of motion EXAM: CT OF THE UPPER LEFT EXTREMITY WITHOUT CONTRAST TECHNIQUE: Multidetector CT imaging of the upper left extremity was performed according to the standard protocol. RADIATION DOSE REDUCTION: This exam was performed according to the  departmental dose-optimization program which includes automated exposure control, adjustment of the mA and/or kV according to patient size and/or use of iterative reconstruction technique. COMPARISON:  MR shoulder 04/18/2023 FINDINGS: Bones/Joint/Cartilage No fracture or dislocation. Normal alignment. No joint effusion. 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 Mild osteoarthritis of the glenohumeral joint. Osteoarthritis of the glenohumeral joint.  Moderate arthropathy of the acromioclavicular joint. Ligaments Ligaments are suboptimally evaluated by CT. Muscles and Tendons Muscles are normal. No muscle atrophy. Full-thickness tear of the anterior supraspinatus tendon better visualized on the MRI of the shoulder dated 04/18/2023. Soft tissue No fluid collection or hematoma. No soft tissue mass. Visualized portions of the lung are clear. Thoracic aortic atherosclerosis. Multi vessel coronary artery atherosclerosis. IMPRESSION: 1. No acute osseous injury of the left shoulder. 2. 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 3. Mild osteoarthritis of the glenohumeral joint. 4. Moderate arthropathy of the acromioclavicular joint. 5.  Aortic Atherosclerosis (ICD10-I70.0). Electronically Signed   By: Elige Ko M.D.   On: 05/03/2023 08:57   LONG TERM MONITOR (3-14 DAYS) Result Date: 04/25/2023   Predominant rhythm normal sinus rhythm with average heart rate 67bpm and ranged from 45 to 113bpm.   1 episode of Nonsustained ventricular tachycardia lasting 9 seconds at 222bpm   18 episodes of SVT lasting as long as 13 beats as fast as 169bpm   Paroxysmal atrial fibrillation (16% burden) as high as 198bpm with longest episode lasting 11 hours and 49 min.   Rare PACs atrial couplets and triplets   Rare PVCs and ventricular bigeminy Patch Wear Time:  13 days and 19 hours (2024-12-30T23:55:59-0500 to 2025-01-13T18:58:23-0500) Patient had a min HR of 45 bpm, max HR of 222 bpm,  and avg HR of 76 bpm. Predominant underlying rhythm was Sinus Rhythm. Bundle Branch Block/IVCD was present. 1 run of Ventricular Tachycardia occurred lasting 9.0 secs with a max rate of 222 bpm (avg 180 bpm). 18 Supraventricular Tachycardia runs occurred, the run with the fastest interval lasting 4 beats with a max rate of 169 bpm, the longest lasting 13 beats with an avg rate of 117 bpm. Atrial Fibrillation occurred (16% burden), ranging from 76-198 bpm (avg of 124 bpm), the longest lasting 11 hours 49 mins with an avg rate of 112 bpm. Isolated SVEs were rare (<1.0%), SVE Couplets were rare (<1.0%), and SVE Triplets were rare (<1.0%). Isolated VEs were rare (<1.0%), and no VE Couplets or VE Triplets  were present. Ventricular Bigeminy was present.   DG Shoulder Left Result Date: 04/18/2023 CLINICAL DATA:  Acute shoulder pain after falling on ice 04/15/2023 EXAM: LEFT SHOULDER - 2+ VIEW COMPARISON:  None Available. FINDINGS: Minimal inferior glenoid degenerative spurring. Mild acromioclavicular joint space narrowing and peripheral osteophytosis. Moderate lateral downsloping of the acromion and moderate distal acromial spur. No acute fracture or dislocation. The visualized portion of the left lung is unremarkable. IMPRESSION: 1. Mild acromioclavicular and minimal glenohumeral osteoarthritis. 2. Moderate lateral downsloping of the acromion and moderate distal acromial spur. Electronically Signed   By: Neita Garnet M.D.   On: 04/18/2023 18:47   MR Shoulder Left Wo Contrast Result Date: 04/18/2023 CLINICAL DATA:  Chronic shoulder pain. Rotator cuff tear suspected. Fall on ice. Direct impact injury 4 days ago. EXAM: MRI OF THE LEFT SHOULDER WITHOUT CONTRAST TECHNIQUE: Multiplanar, multisequence MR imaging of the shoulder was performed. No intravenous contrast was administered. COMPARISON:  Left shoulder radiographs 04/17/2023 FINDINGS: Rotator cuff: There is a fluid bright full-thickness tear of the junction of  the distal critical zone and proximal tendon footprint of the anterior and mid AP dimension of the supraspinatus (coronal series 4 images 12 through 14), measuring up to 15 mm in AP dimension. Just posterior to this, there is a punctate high-grade partial-thickness tear of the proximal tendon footprint (coronal image 10) of the mid to  posterior supraspinatus. The infraspinatus is intact. Moderate partial-thickness tear of the superior subscapularis tendon, greatest at the deep aspect. The teres minor is intact. Muscles:  Mild anterior supraspinatus muscle atrophy. Biceps long head: There is moderate partial-thickness tearing of the proximal long head of the biceps tendon. The long head of the biceps tendon is perched on the anterior aspect of the lesser tuberosity as it courses into the bicipital groove (secondary to the subscapularis tendon tear, axial series a images 15 and 16). There is narrowing of the AP dimension widening of the transverse dimension of the tendon in this region, additional mild-to-moderate partial-thickness tearing. Acromioclavicular Joint: There are mild degenerative changes of the acromioclavicular joint including joint space narrowing, subchondral marrow edema, and peripheral osteophytosis. Mild distal lateral broad-based subacromial spurring (coronal images 8 through 10). Type II acromion. Mild-to-moderate fluid within the subacromial/subdeltoid bursa. Glenohumeral Joint: Mild-to-moderate glenoid cartilage thinning, greatest superiorly. Moderate superomedial humeral head cartilage thinning. Small glenohumeral joint effusion. Labrum: There is a fluid bright tear within the chondrolabral junction at the posterosuperior 1:30 through the 3 o'clock location (sagittal series 5, image 15 and axial series 3 images 10 through 12). There is fluid measuring up to 2 mm in thickness throughout this tear, and a small posterosuperior paralabral cyst measuring up to 7 mm (coronal series 4, image 6).  Bones:  No acute fracture. Other: None. IMPRESSION: 1. Full-thickness tear of the junction of the distal critical zone and proximal tendon footprint of the anterior and mid AP dimension of the supraspinatus. Just posterior to this, there is a punctate high-grade partial-thickness tear of the proximal tendon footprint of the mid to posterior supraspinatus. 2. Moderate partial-thickness tear of the superior subscapularis tendon. 3. Moderate partial-thickness tearing of the proximal long head of the biceps tendon. The superior subscapularis tendon tear allows the long head of the biceps tendon to be perched on the anterior aspect of the lesser tuberosity as it courses into the bicipital groove. Additional partial-thickness biceps tendon tearing in this region. 4. Mild degenerative changes of the acromioclavicular joint. Mild distal lateral broad-based subacromial spurring. 5. Mild-to-moderate glenohumeral cartilage degenerative changes. 6. Posterosuperior chondrolabral junction tear at the posterosuperior 1:30 through the posterior 3:00 location. Small posterosuperior paralabral cyst. Electronically Signed   By: Neita Garnet M.D.   On: 04/18/2023 18:46    ASSESSMENT: This a very pleasant 78 year old male referred to the clinic for anemia.  The patient presented in December 2024.  He had a thorough evaluation performed by gastroenterology including colonoscopy, EGD, and capsule endoscopy which were negative for bleeding.   He had several lab studies performed today including a CBC, CMP, iron studies, ferritin, folate, SPEP, and vitamin B12.  His labs from today demonstrates improving iron deficiency with a hemoglobin of 12.3.  His MCV is normal at 87.3.  His iron studies are pending.  His B12 is pending.  If his labs continue to show significant iron deficiency, we may recommend IV iron infusions with Venofer 300 mg weekly x 3 starting next week at the [facility] infusion center.  We will see him back  for follow-up visit in 3 months for evaluation and repeat CBC, iron studies, and ferritin.   The patient was given a handout of iron rich food and he was encouraged to increase her dietary intake of iron rich food.  He was also instructed to take his iron supplement 1 tablet p.o. daily with vitamin C.  The ptient was strongly encouraged to quit smoking.  The patient voices understanding of current disease status and treatment options and is in agreement with the current care plan.  All questions were answered. The patient knows to call the clinic with any problems, questions or concerns. We can certainly see the patient much sooner if necessary.  Thank you so much for allowing me to participate in the care of Julian Weaver. I will continue to follow up the patient with you and assist in his care.   Disclaimer: This note was dictated with voice recognition software. Similar sounding words can inadvertently be transcribed and may not be corrected upon review.   Stephaie Dardis L Ethelle Ola May 06, 2023, 2:29 PM  ADDENDUM: Hematology/Oncology Attending: I had a face-to-face encounter with the patient today.  I reviewed his record, lab and recommended his care plan.  This is a very pleasant 78 years old white male with history of COPD, dyslipidemia, atrial fibrillation and currently on anticoagulation with Eliquis, hypertension and hemorrhoids.  The patient was seen by his primary care physician for significant fatigue and weakness and blood work in December 2024 showed significant decline in his hemoglobin down to 5.8 compared to 11.5 in August 2024.  The patient was admitted to the hospital and received PRBCs transfusion and started on oral iron supplements with ferrous sulfate.  His serum iron was low at 19 with iron saturation of 5% and ferritin level of 6.  He was seen by gastroenterology and had EGD, colonoscopy as well as capsule endoscopy.  His EGD and colonoscopy were  unremarkable but we do not have the result for the capsule endoscopy yet.  The patient reported that it was also negative. The only concerning finding was internal hemorrhoids and being on anticoagulation.  He is feeling much better today with no concerning complaints except for mild fatigue.  Repeat CBC today showed hemoglobin of 12.3 and hematocrit 39.2% with MCV of 87.3.  Iron study showed persistent low serum iron of 23 with iron saturation of 5%.  Ferritin level is still pending.  Other studies including serum protein electrophoresis, and vitamin B12 are still pending. We will arrange for the patient to receive iron infusion with Venofer at the [facility] infusion center. Will arrange for the patient to come back for follow-up visit in 3 months for evaluation and repeat blood work.  He was advised to continue with the oral iron supplement with vitamin C or orange juice. The patient was advised to call immediately if he has any other concerning symptoms in the interval. The total time spent in the appointment was 60 minutes. Disclaimer: This note was dictated with voice recognition software. Similar sounding words can inadvertently be transcribed and may be missed upon review. Lajuana Matte, MD

## 2023-05-06 ENCOUNTER — Inpatient Hospital Stay: Payer: Medicare Other | Attending: Physician Assistant | Admitting: Physician Assistant

## 2023-05-06 ENCOUNTER — Encounter (HOSPITAL_COMMUNITY): Payer: Self-pay | Admitting: Physician Assistant

## 2023-05-06 ENCOUNTER — Ambulatory Visit (HOSPITAL_COMMUNITY)
Admission: RE | Admit: 2023-05-06 | Discharge: 2023-05-06 | Disposition: A | Discharge status: Home / Self Care | Payer: Medicare Other | Source: Ambulatory Visit | Attending: Physician Assistant | Admitting: Physician Assistant

## 2023-05-06 ENCOUNTER — Inpatient Hospital Stay: Payer: Medicare Other

## 2023-05-06 VITALS — BP 124/60 | HR 57 | Ht 68.0 in | Wt 174.0 lb

## 2023-05-06 VITALS — BP 141/72 | HR 65 | Temp 97.4°F | Resp 13 | Wt 173.8 lb

## 2023-05-06 DIAGNOSIS — Z7901 Long term (current) use of anticoagulants: Secondary | ICD-10-CM | POA: Insufficient documentation

## 2023-05-06 DIAGNOSIS — R008 Other abnormalities of heart beat: Secondary | ICD-10-CM | POA: Insufficient documentation

## 2023-05-06 DIAGNOSIS — I480 Paroxysmal atrial fibrillation: Secondary | ICD-10-CM | POA: Insufficient documentation

## 2023-05-06 DIAGNOSIS — E7800 Pure hypercholesterolemia, unspecified: Secondary | ICD-10-CM | POA: Insufficient documentation

## 2023-05-06 DIAGNOSIS — M2540 Effusion, unspecified joint: Secondary | ICD-10-CM | POA: Diagnosis not present

## 2023-05-06 DIAGNOSIS — M6250 Muscle wasting and atrophy, not elsewhere classified, unspecified site: Secondary | ICD-10-CM | POA: Insufficient documentation

## 2023-05-06 DIAGNOSIS — D509 Iron deficiency anemia, unspecified: Secondary | ICD-10-CM

## 2023-05-06 DIAGNOSIS — I2510 Atherosclerotic heart disease of native coronary artery without angina pectoris: Secondary | ICD-10-CM | POA: Diagnosis not present

## 2023-05-06 DIAGNOSIS — D6869 Other thrombophilia: Secondary | ICD-10-CM | POA: Insufficient documentation

## 2023-05-06 DIAGNOSIS — D649 Anemia, unspecified: Secondary | ICD-10-CM | POA: Insufficient documentation

## 2023-05-06 DIAGNOSIS — F17210 Nicotine dependence, cigarettes, uncomplicated: Secondary | ICD-10-CM | POA: Insufficient documentation

## 2023-05-06 DIAGNOSIS — E785 Hyperlipidemia, unspecified: Secondary | ICD-10-CM | POA: Insufficient documentation

## 2023-05-06 DIAGNOSIS — I10 Essential (primary) hypertension: Secondary | ICD-10-CM | POA: Insufficient documentation

## 2023-05-06 DIAGNOSIS — Z955 Presence of coronary angioplasty implant and graft: Secondary | ICD-10-CM | POA: Diagnosis not present

## 2023-05-06 DIAGNOSIS — I471 Supraventricular tachycardia, unspecified: Secondary | ICD-10-CM | POA: Insufficient documentation

## 2023-05-06 DIAGNOSIS — Z7982 Long term (current) use of aspirin: Secondary | ICD-10-CM | POA: Insufficient documentation

## 2023-05-06 DIAGNOSIS — K649 Unspecified hemorrhoids: Secondary | ICD-10-CM | POA: Insufficient documentation

## 2023-05-06 DIAGNOSIS — J449 Chronic obstructive pulmonary disease, unspecified: Secondary | ICD-10-CM | POA: Insufficient documentation

## 2023-05-06 DIAGNOSIS — Z79899 Other long term (current) drug therapy: Secondary | ICD-10-CM | POA: Diagnosis not present

## 2023-05-06 DIAGNOSIS — I700 Atherosclerosis of aorta: Secondary | ICD-10-CM | POA: Diagnosis not present

## 2023-05-06 LAB — CBC WITH DIFFERENTIAL (CANCER CENTER ONLY)
Abs Immature Granulocytes: 0.04 10*3/uL (ref 0.00–0.07)
Basophils Absolute: 0.1 10*3/uL (ref 0.0–0.1)
Basophils Relative: 0 %
Eosinophils Absolute: 0.3 10*3/uL (ref 0.0–0.5)
Eosinophils Relative: 2 %
HCT: 39.2 % (ref 39.0–52.0)
Hemoglobin: 12.3 g/dL — ABNORMAL LOW (ref 13.0–17.0)
Immature Granulocytes: 0 %
Lymphocytes Relative: 9 %
Lymphs Abs: 1.3 10*3/uL (ref 0.7–4.0)
MCH: 27.4 pg (ref 26.0–34.0)
MCHC: 31.4 g/dL (ref 30.0–36.0)
MCV: 87.3 fL (ref 80.0–100.0)
Monocytes Absolute: 1.2 10*3/uL — ABNORMAL HIGH (ref 0.1–1.0)
Monocytes Relative: 9 %
Neutro Abs: 11.2 10*3/uL — ABNORMAL HIGH (ref 1.7–7.7)
Neutrophils Relative %: 80 %
Platelet Count: 241 10*3/uL (ref 150–400)
RBC: 4.49 MIL/uL (ref 4.22–5.81)
RDW: 24.4 % — ABNORMAL HIGH (ref 11.5–15.5)
WBC Count: 14.1 10*3/uL — ABNORMAL HIGH (ref 4.0–10.5)
nRBC: 0 % (ref 0.0–0.2)

## 2023-05-06 LAB — IRON AND IRON BINDING CAPACITY (CC-WL,HP ONLY)
Iron: 23 ug/dL — ABNORMAL LOW (ref 45–182)
Saturation Ratios: 5 % — ABNORMAL LOW (ref 17.9–39.5)
TIBC: 437 ug/dL (ref 250–450)
UIBC: 414 ug/dL — ABNORMAL HIGH (ref 117–376)

## 2023-05-06 LAB — CMP (CANCER CENTER ONLY)
ALT: 15 U/L (ref 0–44)
AST: 18 U/L (ref 15–41)
Albumin: 4 g/dL (ref 3.5–5.0)
Alkaline Phosphatase: 87 U/L (ref 38–126)
Anion gap: 6 (ref 5–15)
BUN: 14 mg/dL (ref 8–23)
CO2: 28 mmol/L (ref 22–32)
Calcium: 9 mg/dL (ref 8.9–10.3)
Chloride: 103 mmol/L (ref 98–111)
Creatinine: 1.06 mg/dL (ref 0.61–1.24)
GFR, Estimated: >60 mL/min (ref >60)
Glucose, Bld: 97 mg/dL (ref 70–99)
Potassium: 3.9 mmol/L (ref 3.5–5.1)
Sodium: 137 mmol/L (ref 135–145)
Total Bilirubin: 0.7 mg/dL (ref 0.0–1.2)
Total Protein: 7 g/dL (ref 6.5–8.1)

## 2023-05-06 LAB — VITAMIN B12: Vitamin B-12: 520 pg/mL (ref 180–914)

## 2023-05-06 LAB — FERRITIN: Ferritin: 44 ng/mL (ref 24–336)

## 2023-05-06 LAB — SAMPLE TO BLOOD BANK

## 2023-05-06 NOTE — Patient Instructions (Signed)
Stop Multaq on 05/23/23   Dofetilide [facility]) Hospital Admission  Preparing for admission:  Check with drug insurance company for cost of drug to ensure affordability --- Dofetilide 500 mcg twice a day.  GoodRx is an option if insurance copay is unaffordable. Patient assistance is also available.  A pharmacist will review all your medications for potential interactions with Tikosyn. If any medication changes are needed prior to admission we will be in touch with you.  If any new medications are started AFTER your admission date is set with Kennyth Arnold RN [phone]). Please notify our office immediately so your medication list can be updated and reviewed by our pharmacist again.  No Benadryl is allowed 3 days prior to admission.  Please ensure no missed doses of your anticoagulation (blood thinner) for 3 weeks prior to admission. If a dose is missed please notify our office immediately.  If you are on coumadin/warfarin we will require 4 weekly therapeutic INR checks prior to admission.   On day of admission:  [facility] office visit will be scheduled on the morning of admission for preliminary labs and EKG.  Please take your morning medications and have a normal breakfast prior to arrival to the appointment.     Tikosyn initiation requires a 3 night/4 day hospital stay with constant telemetry monitoring. You will have an EKG after each dose of Tikosyn as well as daily lab draws.  You may bring personal belongings/clothing with you to the hospital. Please leave your suitcase in the car until you arrive in admissions.  If the drug does not convert you to normal rhythm a cardioversion will be performed after the 4th dose of Tikosyn. The cardioversion will be scheduled prior to admission - you may notice this on your appointments and receive a call from the hospital pharmacy to review your medications. You do not need to do anything to prepare for the cardioversion prior to admission.   Time  of admission is dependent on bed availability in the hospital. In some instances, you will be sent home until bed is available. Rarely admission can be delayed to the following day if hospital census prevents available beds.  Questions please call our office at [phone]

## 2023-05-06 NOTE — Progress Notes (Signed)
Primary Care Physician: Shon Hale, MD Referring Physician: Dr. Storm Frisk Julian Weaver is a 78 y.o. male with a h/o  CAD, HTN, Carotid disease, HLD who presents to the [facility] for follow up. Patient is on Eliquis for a CHADS2VASC score of 4. He has been maintained on Multaq for rhythm control.  Patient returns for follow up for atrial fibrillation. Patient wore a 2 week Zio monitor for a syncopal episode that occurred in December. The monitor showed 16% afib burden. Of note, he was hospitalized 03/25/23 with a hgb of 5.8. He received 2 units of PRBC. Upper endoscopy, colonoscopy, and pill endoscopy did not show any sources of bleeding. He has a visit later today to establish care with hematology.   Today, he denies symptoms of palpitations, chest pain, shortness of breath, orthopnea, PND, lower extremity edema, dizziness, presyncope, syncope, snoring, daytime somnolence, bleeding, or neurologic sequela. The patient is tolerating medications without difficulties and is otherwise without complaint today.    Past Medical History:  Diagnosis Date   Allergic rhinitis    Aortic atherosclerosis (HCC)    Arthritis    Carotid artery stenosis    1-39% bilateral dopplers 11/2022   Coronary artery disease 04/13/2008   Drug eluting stent in RCA // Myoview 04/2019: EF 52, normal perfusion; low risk   Hypercholesteremia    Hypertension    PAF (paroxysmal atrial fibrillation) (HCC)    RBBB    Renal disorder    Tobacco abuse    Current Outpatient Medications  Medication Sig Dispense Refill   acetaminophen (TYLENOL) 500 MG tablet Take 1 tablet (500 mg total) by mouth every 6 (six) hours as needed for up to 14 days. 30 tablet 0   albuterol (VENTOLIN HFA) 108 (90 Base) MCG/ACT inhaler Inhale 2 puffs into the lungs every 4 (four) hours as needed for shortness of breath.     amLODipine (NORVASC) 5 MG tablet TAKE 1 TABLET(5 MG) BY MOUTH DAILY 90 tablet 2    aspirin EC 81 MG tablet Take 81 mg by mouth daily. Swallow whole.     atorvastatin (LIPITOR) 20 MG tablet TAKE 1 TABLET(20 MG) BY MOUTH DAILY 90 tablet 2   Coenzyme Q10 (CO Q-10) 100 MG CAPS Take 1 capsule by mouth every morning.     cyanocobalamin (VITAMIN B12) 1000 MCG tablet Take 1,000 mcg by mouth daily.     dronedarone (MULTAQ) 400 MG tablet Take 1 tablet (400 mg total) by mouth 2 (two) times daily with a meal. 60 tablet 6   ELIQUIS 5 MG TABS tablet TAKE 1 TABLET(5 MG) BY MOUTH TWICE DAILY 180 tablet 1   ferrous sulfate 325 (65 FE) MG tablet Take 1 tablet (325 mg total) by mouth 2 (two) times daily with a meal. 60 tablet 11   Fluticasone-Umeclidin-Vilant 100-62.5-25 MCG/ACT AEPB Inhale 1 puff into the lungs at bedtime. Not sure the dosage     furosemide (LASIX) 20 MG tablet Take 20 mg by mouth daily.     metoprolol tartrate (LOPRESSOR) 25 MG tablet TAKE 1/2 TABLET BY MOUTH TWICE DAILY (Patient taking differently: Take 25 mg by mouth 2 (two) times daily.) 90 tablet 2   oxyCODONE (ROXICODONE) 5 MG immediate release tablet Take 1 tablet (5 mg total) by mouth every 4 (four) hours as needed for severe pain (pain score 7-10) or breakthrough pain. 20 tablet 0   Current Facility-Administered Medications  Medication Dose Route Frequency Provider Last Rate Last Admin  triamcinolone acetonide (KENALOG) 10 MG/ML injection 10 mg  10 mg Other Once Asencion Islam, DPM        ROS- All systems are reviewed and negative except as per the HPI above  Physical Exam: There were no vitals filed for this visit.   Wt Readings from Last 3 Encounters:  04/29/23 78.9 kg  03/28/23 74.3 kg  11/29/22 80.3 kg    GEN: Well nourished, well developed in no acute distress CARDIAC: Regular rate and rhythm, no murmurs, rubs, gallops RESPIRATORY:  Clear to auscultation without rales, wheezing or rhonchi  ABDOMEN: Soft, non-tender, non-distended EXTREMITIES:  No edema; No deformity    EKG today  demonstrates SB, RBBB Vent. rate 57 BPM PR interval 134 ms QRS duration 138 ms QT/QTcB 434/422 ms   Echo 03/21/23  1. Left ventricular ejection fraction, by estimation, is 60 to 65%. The  left ventricle has normal function. The left ventricle has no regional  wall motion abnormalities. Left ventricular diastolic parameters are  indeterminate.   2. Right ventricular systolic function is normal. The right ventricular  size is normal.   3. The mitral valve is normal in structure. Trivial mitral valve  regurgitation. No evidence of mitral stenosis.   4. The aortic valve is tricuspid. Aortic valve regurgitation is trivial.  No aortic stenosis is present.   5. The inferior vena cava is normal in size with greater than 50%  respiratory variability, suggesting right atrial pressure of 3 mmHg.   Comparison(s): No significant change from prior study. Prior images  reviewed side by side.    CHA2DS2-VASc Score = 4  The patient's score is based upon: CHF History: 0 HTN History: 1 Diabetes History: 0 Stroke History: 0 Vascular Disease History: 1 Age Score: 2 Gender Score: 0       ASSESSMENT AND PLAN: Paroxysmal Atrial Fibrillation (ICD10:  I48.0) The patient's CHA2DS2-VASc score is 4, indicating a 4.8% annual risk of stroke.   Zio monitor showed 16% afib burden We discussed rhythm control options including alternate AAD (dofetilide, amiodarone) and ablation.  Patient would like to pursue dofetilide admission. Continue Eliquis 5 mg BID, states no missed doses in the last 3 weeks. No recent benadryl use PharmD to screen medications. He will need to stop Multaq 3 days before admission.  QTc in SR 422 ms Recent bmet/mag reviewed.  Continue Lopressor 25 mg BID Continue Eliquis 5 mg BID  Secondary Hypercoagulable State (ICD10:  D68.69) The patient is at significant risk for stroke/thromboembolism based upon his CHA2DS2-VASc Score of 4.  Continue Apixaban (Eliquis). We discussed  Watchman implant given his anemia. He may be a good candidate in the future for concomitant Watchman/ablation.  CAD S/p DES 2010 No anginal symptoms Followed by Dr Mayford Knife  HTN Stable on current regimen   Follow up in the AF clinic for dofetilide loading.    Jorja Loa PA-C [facility] [address] [place], [place] [address] [phone]

## 2023-05-07 ENCOUNTER — Telehealth: Payer: Self-pay | Admitting: Pharmacist Clinician (PhC)/ Clinical Pharmacy Specialist

## 2023-05-07 ENCOUNTER — Telehealth: Payer: Self-pay

## 2023-05-07 ENCOUNTER — Other Ambulatory Visit: Payer: Self-pay | Admitting: Physician Assistant

## 2023-05-07 NOTE — Telephone Encounter (Signed)
Medication list reviewed in anticipation of upcoming Tikosyn initiation. Patient is not taking any contraindicated or QTc prolonging medications.  (Chart note indicates patient to stop Multaq 3 days prior to admit).   Patient is anticoagulated on Eliquis on the appropriate dose. Please ensure that patient has not missed any anticoagulation doses in the 3 weeks prior to Tikosyn initiation.   Patient will need to be counseled to avoid use of Benadryl while on Tikosyn and in the 2-3 days prior to Tikosyn initiation.

## 2023-05-07 NOTE — Telephone Encounter (Signed)
Tried to reach patient in regards to lab results. Per Cassie, PA-  Iron levels are low.  Placing order for IV iron at [facility] infusion center. LVM for return call with the results and information.

## 2023-05-07 NOTE — Telephone Encounter (Signed)
-----   Message from [facility] sent at 05/06/2023  9:14 AM EST ----- Regarding: tikosyn admission 03/03 Please review patient medication list for tikosyn admission per [employer]

## 2023-05-08 ENCOUNTER — Other Ambulatory Visit: Payer: Self-pay

## 2023-05-08 ENCOUNTER — Telehealth: Payer: Self-pay

## 2023-05-08 ENCOUNTER — Encounter: Payer: Self-pay | Admitting: Physician Assistant

## 2023-05-08 LAB — PROTEIN ELECTROPHORESIS, SERUM, WITH REFLEX
A/G Ratio: 1.2 (ref 0.7–1.7)
Albumin ELP: 3.4 g/dL (ref 2.9–4.4)
Alpha-1-Globulin: 0.3 g/dL (ref 0.0–0.4)
Alpha-2-Globulin: 0.8 g/dL (ref 0.4–1.0)
Beta Globulin: 1 g/dL (ref 0.7–1.3)
Gamma Globulin: 0.8 g/dL (ref 0.4–1.8)
Globulin, Total: 2.9 g/dL (ref 2.2–3.9)
Total Protein ELP: 6.3 g/dL (ref 6.0–8.5)

## 2023-05-08 NOTE — Telephone Encounter (Signed)
Spoke with patient and he is aware of lab results and [facility] will be calling with an appt.

## 2023-05-08 NOTE — Telephone Encounter (Signed)
Julian Weaver, patient will be scheduled as soon as possible.  Auth Submission: NO AUTH NEEDED Site of care: Site of care: [facility] Payer: Medicare A/B with BCBS supplement Medication & CPT/J Code(s) submitted: Feraheme (ferumoxytol) [id number] Route of submission (phone, fax, portal):  Phone # Fax # Auth type: Buy/Bill PB Units/visits requested: 510mg  x 2 doses Reference number:  Approval from: 05/08/23 to 11/05/23

## 2023-05-09 ENCOUNTER — Ambulatory Visit (HOSPITAL_COMMUNITY): Payer: Medicare Other | Admitting: Physician Assistant

## 2023-05-12 ENCOUNTER — Telehealth (HOSPITAL_COMMUNITY): Payer: Self-pay | Admitting: *Deleted

## 2023-05-12 NOTE — Telephone Encounter (Signed)
Pt called in stating his oncologist is recommending no medication changes (scheduled for tikosyn admission) until after his iron/hbg are better stabilized. Pt states he will call once given the ok by Dr Gwenyth Bouillon.

## 2023-05-13 ENCOUNTER — Telehealth: Payer: Self-pay | Admitting: Orthopaedic Surgery

## 2023-05-13 NOTE — Telephone Encounter (Signed)
FYI: Patient left a message stating he is going to postpone shoulder surgery for now. His Oncologist is working with him in efforts to get his iron up to an acceptable level before he has surgery.

## 2023-05-14 ENCOUNTER — Ambulatory Visit (INDEPENDENT_AMBULATORY_CARE_PROVIDER_SITE_OTHER): Payer: Medicare Other

## 2023-05-14 VITALS — BP 130/66 | HR 51 | Temp 97.7°F | Resp 20 | Ht 69.0 in | Wt 174.6 lb

## 2023-05-14 DIAGNOSIS — D509 Iron deficiency anemia, unspecified: Secondary | ICD-10-CM | POA: Diagnosis not present

## 2023-05-14 MED ORDER — DIPHENHYDRAMINE HCL 25 MG PO CAPS
25.0000 mg | ORAL_CAPSULE | Freq: Once | ORAL | Status: AC
  Administered 2023-05-14: 25 mg via ORAL
  Filled 2023-05-14: qty 1

## 2023-05-14 MED ORDER — ACETAMINOPHEN 325 MG PO TABS
650.0000 mg | ORAL_TABLET | Freq: Once | ORAL | Status: AC
  Administered 2023-05-14: 650 mg via ORAL
  Filled 2023-05-14: qty 2

## 2023-05-14 MED ORDER — SODIUM CHLORIDE 0.9 % IV SOLN
510.0000 mg | Freq: Once | INTRAVENOUS | Status: AC
  Administered 2023-05-14: 510 mg via INTRAVENOUS
  Filled 2023-05-14: qty 17

## 2023-05-14 NOTE — Progress Notes (Signed)
Diagnosis: Iron Deficiency Anemia  Provider:  Chilton Greathouse MD  Procedure: IV Infusion  IV Type: Peripheral, IV Location: R upper arm  Feraheme (Ferumoxytol), Dose: 510 mg  Infusion Start Time: 1109  Infusion Stop Time: 1127  Post Infusion IV Care: Observation period completed and Peripheral IV Discontinued  Discharge: Condition: Good, Destination: Home . AVS Declined  Performed by:  Rico Ala, LPN

## 2023-05-21 ENCOUNTER — Ambulatory Visit (INDEPENDENT_AMBULATORY_CARE_PROVIDER_SITE_OTHER): Payer: Medicare Other

## 2023-05-21 VITALS — BP 119/61 | HR 52 | Temp 98.7°F | Resp 18 | Ht 69.0 in | Wt 170.4 lb

## 2023-05-21 DIAGNOSIS — D509 Iron deficiency anemia, unspecified: Secondary | ICD-10-CM | POA: Diagnosis not present

## 2023-05-21 MED ORDER — ACETAMINOPHEN 325 MG PO TABS
650.0000 mg | ORAL_TABLET | Freq: Once | ORAL | Status: AC
  Administered 2023-05-21: 650 mg via ORAL
  Filled 2023-05-21: qty 2

## 2023-05-21 MED ORDER — DIPHENHYDRAMINE HCL 25 MG PO CAPS
25.0000 mg | ORAL_CAPSULE | Freq: Once | ORAL | Status: AC
  Administered 2023-05-21: 25 mg via ORAL
  Filled 2023-05-21: qty 1

## 2023-05-21 MED ORDER — SODIUM CHLORIDE 0.9 % IV SOLN
510.0000 mg | Freq: Once | INTRAVENOUS | Status: AC
  Administered 2023-05-21: 510 mg via INTRAVENOUS
  Filled 2023-05-21: qty 17

## 2023-05-21 NOTE — Progress Notes (Signed)
 Diagnosis: Iron Deficiency Anemia  Provider:  Chilton Greathouse MD  Procedure: IV Infusion  IV Type: Peripheral, IV Location: R Antecubital  Feraheme (Ferumoxytol), Dose: 510 mg  Infusion Start Time: 0932  Infusion Stop Time: 0950  Post Infusion IV Care: Observation period completed and Peripheral IV Discontinued  Discharge: Condition: Good, Destination: Home . AVS Declined  Performed by:  Garnette Czech, RN

## 2023-05-26 ENCOUNTER — Ambulatory Visit (HOSPITAL_COMMUNITY): Payer: Medicare Other | Admitting: Physician Assistant

## 2023-06-06 ENCOUNTER — Other Ambulatory Visit (HOSPITAL_COMMUNITY): Payer: Self-pay | Admitting: Physician Assistant

## 2023-06-09 ENCOUNTER — Other Ambulatory Visit (HOSPITAL_COMMUNITY): Payer: Self-pay | Admitting: *Deleted

## 2023-06-11 ENCOUNTER — Other Ambulatory Visit (HOSPITAL_COMMUNITY): Payer: Self-pay | Admitting: *Deleted

## 2023-06-11 MED ORDER — MULTAQ 400 MG PO TABS
400.0000 mg | ORAL_TABLET | Freq: Two times a day (BID) | ORAL | 6 refills | Status: DC
Start: 2023-06-11 — End: 2023-08-11

## 2023-06-12 DIAGNOSIS — H2513 Age-related nuclear cataract, bilateral: Secondary | ICD-10-CM | POA: Diagnosis not present

## 2023-06-13 DIAGNOSIS — J449 Chronic obstructive pulmonary disease, unspecified: Secondary | ICD-10-CM | POA: Diagnosis not present

## 2023-06-13 DIAGNOSIS — I2510 Atherosclerotic heart disease of native coronary artery without angina pectoris: Secondary | ICD-10-CM | POA: Diagnosis not present

## 2023-06-13 DIAGNOSIS — I480 Paroxysmal atrial fibrillation: Secondary | ICD-10-CM | POA: Diagnosis not present

## 2023-06-13 DIAGNOSIS — J441 Chronic obstructive pulmonary disease with (acute) exacerbation: Secondary | ICD-10-CM | POA: Diagnosis not present

## 2023-06-19 ENCOUNTER — Telehealth: Payer: Self-pay | Admitting: Medical Oncology

## 2023-06-19 NOTE — Telephone Encounter (Signed)
 Pending reverse shoulder arthroplasty L shoulder -Wants to move up May appt to next month. He had 2 iron tx -02/ 19 and 2/26.

## 2023-07-04 NOTE — Progress Notes (Signed)
 [facility] OFFICE PROGRESS NOTE  Ransom Byers, MD [address] [place] [address]  DIAGNOSIS: Anemia   PRIOR THERAPY: None  CURRENT THERAPY:  1) iron deficiency anemia  2) IV iron as needed with Feraheme, most recent on 05/21/2023.  INTERVAL HISTORY: Julian Weaver 78 y.o. male returns to the clinic today for a follow-up visit.  The patient establish care in the clinic on 05/06/2023.  The patient is followed for iron deficiency anemia.  The patient has been evaluated by gastroenterology and had EGD and colonoscopy which were negative for bleeding.  He also had a capsule endoscopy for which the patient states that it was negative for any etiology of heme positive stool.  Therefore he takes an iron supplement p.o. daily and is compliant.  He tolerates this well.  He also receives IV iron as needed the most recent being with Feraheme on 05/21/2023.  He tolerated this well.  Patient is hoping to have a shoulder replacement soon.  Overall the patient is feeling well.  He reports his energy is about at his baseline but he does have some good and bad days with regard to his fatigue.  Denies any dyspnea on exertion.  Denies any syncope or visible bleeding.  He may have intermittent lightheadedness.  He is here today for evaluation and repeat blood work.   MEDICAL HISTORY: Past Medical History:  Diagnosis Date   Allergic rhinitis    Aortic atherosclerosis (HCC)    Arthritis    Carotid artery stenosis    1-39% bilateral dopplers 11/2022   Coronary artery disease 04/13/2008   Drug eluting stent in RCA // Myoview 04/2019: EF 52, normal perfusion; low risk   Hypercholesteremia    Hypertension    PAF (paroxysmal atrial fibrillation) (HCC)    RBBB    Renal disorder    Tobacco abuse     ALLERGIES:  is allergic to bee venom, amoxicillin-pot clavulanate, hydromorphone, and tramadol.  MEDICATIONS:  Current Outpatient Medications  Medication Sig  Dispense Refill   albuterol (VENTOLIN HFA) 108 (90 Base) MCG/ACT inhaler Inhale 2 puffs into the lungs every 4 (four) hours as needed for shortness of breath.     amLODipine (NORVASC) 5 MG tablet TAKE 1 TABLET(5 MG) BY MOUTH DAILY 90 tablet 2   aspirin EC 81 MG tablet Take 81 mg by mouth daily. Swallow whole.     atorvastatin (LIPITOR) 20 MG tablet TAKE 1 TABLET(20 MG) BY MOUTH DAILY 90 tablet 2   Coenzyme Q10 (CO Q-10) 100 MG CAPS Take 1 capsule by mouth every morning.     cyanocobalamin (VITAMIN B12) 1000 MCG tablet Take 1,000 mcg by mouth daily.     dronedarone (MULTAQ) 400 MG tablet Take 1 tablet (400 mg total) by mouth 2 (two) times daily with a meal. 60 tablet 6   ELIQUIS 5 MG TABS tablet TAKE 1 TABLET(5 MG) BY MOUTH TWICE DAILY 180 tablet 1   ferrous sulfate 325 (65 FE) MG tablet Take 1 tablet (325 mg total) by mouth 2 (two) times daily with a meal. 60 tablet 11   Fluticasone-Umeclidin-Vilant 100-62.5-25 MCG/ACT AEPB Inhale 1 puff into the lungs at bedtime. Not sure the dosage     furosemide (LASIX) 20 MG tablet Take 20 mg by mouth daily.     metoprolol tartrate (LOPRESSOR) 25 MG tablet TAKE 1/2 TABLET BY MOUTH TWICE DAILY (Patient taking differently: Take 25 mg by mouth 2 (two) times daily.) 90 tablet 2  oxyCODONE (ROXICODONE) 5 MG immediate release tablet Take 1 tablet (5 mg total) by mouth every 4 (four) hours as needed for severe pain (pain score 7-10) or breakthrough pain. 20 tablet 0   Current Facility-Administered Medications  Medication Dose Route Frequency Provider Last Rate Last Admin   triamcinolone acetonide (KENALOG) 10 MG/ML injection 10 mg  10 mg Other Once Lizzie Riis, DPM        SURGICAL HISTORY:  Past Surgical History:  Procedure Laterality Date   CAROTID STENT  2010   COLONOSCOPY N/A 03/28/2023   Procedure: COLONOSCOPY;  Surgeon: Renaye Carp, DO;  Location[facility] ENDOSCOPY;  Service: Gastroenterology;  Laterality: N/A;   ESOPHAGOGASTRODUODENOSCOPY N/A  03/28/2023   Procedure: ESOPHAGOGASTRODUODENOSCOPY (EGD);  Surgeon: Renaye Carp, DO;  Location: [facility] ENDOSCOPY;  Service: Gastroenterology;  Laterality: N/A;   SHOULDER ACROMIOPLASTY  2010    REVIEW OF SYSTEMS:   Constitutional: Improving fatigue. Negative for appetite change, chills, fever and unexpected weight change.  HENT:  Negative for mouth sores, nosebleeds, sore throat and trouble swallowing.   Eyes: Negative for eye problems and icterus.  Respiratory: Negative for cough, hemoptysis, shortness of breath and wheezing.   Cardiovascular: Negative for chest pain and leg swelling.  Gastrointestinal: Negative for abdominal pain, constipation, diarrhea, nausea and vomiting.  Genitourinary: Negative for bladder incontinence, difficulty urinating, dysuria, frequency and hematuria.   Musculoskeletal: Negative for back pain, gait problem, neck pain and neck stiffness.  Skin: Negative for itching and rash.  Neurological: Negative for dizziness, extremity weakness, gait problem, headaches, light-headedness and seizures.  Hematological: Negative for adenopathy. Does not bruise/bleed easily.  Psychiatric/Behavioral: Negative for confusion, depression and sleep disturbance. The patient is not nervous/anxious.    PHYSICAL EXAMINATION:  There were no vitals taken for this visit.  ECOG PERFORMANCE STATUS: 1  Physical Exam  Constitutional: Oriented to person, place, and time and well-developed, well-nourished, and in no distress.  HENT:  Head: Normocephalic and atraumatic.  Mouth/Throat: Oropharynx is clear and moist. No oropharyngeal exudate.  Eyes: Conjunctivae are normal. Right eye exhibits no discharge. Left eye exhibits no discharge. No scleral icterus.  Neck: Normal range of motion. Neck supple.  Cardiovascular: Normal rate, regular rhythm, normal heart sounds and intact distal pulses.   Pulmonary/Chest: Effort normal and breath sounds normal. No respiratory distress. No wheezes. No  rales.  Abdominal: Soft. Bowel sounds are normal. Exhibits no distension and no mass. There is no tenderness.  Musculoskeletal: Normal range of motion. Exhibits no edema.  Lymphadenopathy:    No cervical adenopathy.  Neurological: Alert and oriented to person, place, and time. Exhibits normal muscle tone. Gait normal. Coordination normal.  Skin: Skin is warm and dry. No rash noted. Not diaphoretic. No erythema. No pallor.  Psychiatric: Mood, memory and judgment normal.  Vitals reviewed.  LABORATORY DATA: Lab Results  Component Value Date   WBC 14.1 (H) 05/06/2023   HGB 12.3 (L) 05/06/2023   HCT 39.2 05/06/2023   MCV 87.3 05/06/2023   PLT 241 05/06/2023      Chemistry      Component Value Date/Time   NA 137 05/06/2023 1324   NA 140 03/20/2020 0831   K 3.9 05/06/2023 1324   CL 103 05/06/2023 1324   CO2 28 05/06/2023 1324   BUN 14 05/06/2023 1324   BUN 13 03/20/2020 0831   CREATININE 1.06 05/06/2023 1324      Component Value Date/Time   CALCIUM 9.0 05/06/2023 1324   ALKPHOS 87 05/06/2023 1324   AST  18 05/06/2023 1324   ALT 15 05/06/2023 1324   BILITOT 0.7 05/06/2023 1324       RADIOGRAPHIC STUDIES:  No results found.   ASSESSMENT/PLAN:  This a very pleasant 78 year old male referred to the clinic for anemia.  The patient presented in December 2024.   He had a thorough evaluation performed by gastroenterology including colonoscopy, EGD, and capsule endoscopy which were negative for bleeding.  The patient receives IV iron as needed with Feraheme.  The most recent dose was on 05/21/23.   He also takes an iron supplement with p.o. daily.  The patient had repeat labs performed today.  His labs show low hemoglobin and normal iron studies.  His ferritin is pending at this time.  It is unlikely that the patient will require any IV iron at this time.  He was instructed to continue taking his iron supplements.  We will see him back for labs and follow-up in 3 months.  Of  course, if he ever has worsening signs and symptoms of anemia he knows he can call us  sooner.  The patient was advised to call immediately if he has any concerning symptoms in the interval. The patient voices understanding of current disease status and treatment options and is in agreement with the current care plan. All questions were answered. The patient knows to call the clinic with any problems, questions or concerns. We can certainly see the patient much sooner if necessary   No orders of the defined types were placed in this encounter.    The total time spent in the appointment was 20-29 minutes  Cruzita Lipa L Nikole Swartzentruber, PA-C 07/04/23

## 2023-07-08 ENCOUNTER — Inpatient Hospital Stay: Attending: Physician Assistant

## 2023-07-08 ENCOUNTER — Inpatient Hospital Stay (HOSPITAL_BASED_OUTPATIENT_CLINIC_OR_DEPARTMENT_OTHER): Admitting: Physician Assistant

## 2023-07-08 ENCOUNTER — Other Ambulatory Visit: Payer: Self-pay | Admitting: Physician Assistant

## 2023-07-08 VITALS — BP 122/63 | HR 50 | Temp 98.3°F | Resp 18 | Ht 69.0 in | Wt 172.7 lb

## 2023-07-08 DIAGNOSIS — D509 Iron deficiency anemia, unspecified: Secondary | ICD-10-CM

## 2023-07-08 DIAGNOSIS — D649 Anemia, unspecified: Secondary | ICD-10-CM

## 2023-07-08 LAB — IRON AND IRON BINDING CAPACITY (CC-WL,HP ONLY)
Iron: 91 ug/dL (ref 45–182)
Saturation Ratios: 27 % (ref 17.9–39.5)
TIBC: 343 ug/dL (ref 250–450)
UIBC: 252 ug/dL (ref 117–376)

## 2023-07-08 LAB — CBC WITH DIFFERENTIAL (CANCER CENTER ONLY)
Abs Immature Granulocytes: 0.02 10*3/uL (ref 0.00–0.07)
Basophils Absolute: 0 10*3/uL (ref 0.0–0.1)
Basophils Relative: 1 %
Eosinophils Absolute: 0.2 10*3/uL (ref 0.0–0.5)
Eosinophils Relative: 3 %
HCT: 44.8 % (ref 39.0–52.0)
Hemoglobin: 15.3 g/dL (ref 13.0–17.0)
Immature Granulocytes: 0 %
Lymphocytes Relative: 17 %
Lymphs Abs: 1.4 10*3/uL (ref 0.7–4.0)
MCH: 30.2 pg (ref 26.0–34.0)
MCHC: 34.2 g/dL (ref 30.0–36.0)
MCV: 88.5 fL (ref 80.0–100.0)
Monocytes Absolute: 0.8 10*3/uL (ref 0.1–1.0)
Monocytes Relative: 10 %
Neutro Abs: 5.9 10*3/uL (ref 1.7–7.7)
Neutrophils Relative %: 69 %
Platelet Count: 190 10*3/uL (ref 150–400)
RBC: 5.06 MIL/uL (ref 4.22–5.81)
RDW: 18.3 % — ABNORMAL HIGH (ref 11.5–15.5)
WBC Count: 8.4 10*3/uL (ref 4.0–10.5)
nRBC: 0 % (ref 0.0–0.2)

## 2023-07-08 LAB — FERRITIN: Ferritin: 106 ng/mL (ref 24–336)

## 2023-07-08 LAB — SAMPLE TO BLOOD BANK

## 2023-07-09 ENCOUNTER — Telehealth: Payer: Self-pay | Admitting: Physician Assistant

## 2023-07-09 NOTE — Telephone Encounter (Signed)
 Left a voicemail with the appointment details.

## 2023-07-12 DIAGNOSIS — I2510 Atherosclerotic heart disease of native coronary artery without angina pectoris: Secondary | ICD-10-CM | POA: Diagnosis not present

## 2023-07-12 DIAGNOSIS — J441 Chronic obstructive pulmonary disease with (acute) exacerbation: Secondary | ICD-10-CM | POA: Diagnosis not present

## 2023-07-12 DIAGNOSIS — J449 Chronic obstructive pulmonary disease, unspecified: Secondary | ICD-10-CM | POA: Diagnosis not present

## 2023-07-12 DIAGNOSIS — I480 Paroxysmal atrial fibrillation: Secondary | ICD-10-CM | POA: Diagnosis not present

## 2023-07-23 DIAGNOSIS — I480 Paroxysmal atrial fibrillation: Secondary | ICD-10-CM | POA: Diagnosis not present

## 2023-07-23 DIAGNOSIS — J449 Chronic obstructive pulmonary disease, unspecified: Secondary | ICD-10-CM | POA: Diagnosis not present

## 2023-07-23 DIAGNOSIS — M1991 Primary osteoarthritis, unspecified site: Secondary | ICD-10-CM | POA: Diagnosis not present

## 2023-07-23 DIAGNOSIS — J441 Chronic obstructive pulmonary disease with (acute) exacerbation: Secondary | ICD-10-CM | POA: Diagnosis not present

## 2023-07-23 DIAGNOSIS — E7800 Pure hypercholesterolemia, unspecified: Secondary | ICD-10-CM | POA: Diagnosis not present

## 2023-07-23 DIAGNOSIS — I2510 Atherosclerotic heart disease of native coronary artery without angina pectoris: Secondary | ICD-10-CM | POA: Diagnosis not present

## 2023-08-05 ENCOUNTER — Other Ambulatory Visit: Payer: Medicare Other

## 2023-08-05 ENCOUNTER — Ambulatory Visit: Payer: Medicare Other | Admitting: Physician Assistant

## 2023-08-11 ENCOUNTER — Encounter (HOSPITAL_COMMUNITY): Payer: Self-pay | Admitting: Physician Assistant

## 2023-08-11 ENCOUNTER — Ambulatory Visit (HOSPITAL_COMMUNITY)
Admission: RE | Admit: 2023-08-11 | Discharge: 2023-08-11 | Disposition: A | Discharge status: Home / Self Care | Source: Ambulatory Visit | Attending: Physician Assistant | Admitting: Physician Assistant

## 2023-08-11 VITALS — BP 134/68 | HR 51 | Ht 69.0 in | Wt 174.4 lb

## 2023-08-11 DIAGNOSIS — J441 Chronic obstructive pulmonary disease with (acute) exacerbation: Secondary | ICD-10-CM | POA: Diagnosis not present

## 2023-08-11 DIAGNOSIS — D6869 Other thrombophilia: Secondary | ICD-10-CM | POA: Insufficient documentation

## 2023-08-11 DIAGNOSIS — Z79899 Other long term (current) drug therapy: Secondary | ICD-10-CM | POA: Insufficient documentation

## 2023-08-11 DIAGNOSIS — Z5181 Encounter for therapeutic drug level monitoring: Secondary | ICD-10-CM | POA: Diagnosis not present

## 2023-08-11 DIAGNOSIS — J449 Chronic obstructive pulmonary disease, unspecified: Secondary | ICD-10-CM | POA: Diagnosis not present

## 2023-08-11 DIAGNOSIS — I2510 Atherosclerotic heart disease of native coronary artery without angina pectoris: Secondary | ICD-10-CM | POA: Diagnosis not present

## 2023-08-11 DIAGNOSIS — I480 Paroxysmal atrial fibrillation: Secondary | ICD-10-CM | POA: Diagnosis not present

## 2023-08-11 MED ORDER — METOPROLOL TARTRATE 25 MG PO TABS: 12.5000 mg | ORAL_TABLET | Freq: Two times a day (BID) | ORAL | 2 refills | Status: DC

## 2023-08-11 MED ORDER — MULTAQ 400 MG PO TABS: 400.0000 mg | ORAL_TABLET | Freq: Two times a day (BID) | ORAL | 6 refills | Status: DC

## 2023-08-11 NOTE — Progress Notes (Signed)
 Primary Care Physician: Ransom Byers, MD Referring Physician: Dr. Charlyn Cooley Hollister Wessler is a 78 y.o. male with a h/o  CAD, HTN, anemia, Carotid disease, HLD who presents to the [facility] for follow up. Patient is on Eliquis  for stroke prevention. He has been maintained on Multaq  for rhythm control. Patient wore a 2 week Zio monitor for a syncopal episode that occurred in December. The monitor showed 16% afib burden. He was scheduled for dofetilide loading but cancelled while his anemia was being treated.   Patient returns for follow up for atrial fibrillation and Multaq  monitoring. He reports that he has done well since his last visit. He has not had any heart racing symptoms. No bleeding issues on anticoagulation. He has noted low HR at home in the mid 40s.   Today, he  denies symptoms of palpitations, chest pain, shortness of breath, orthopnea, PND, lower extremity edema, dizziness, presyncope, syncope, snoring, daytime somnolence, bleeding, or neurologic sequela. The patient is tolerating medications without difficulties and is otherwise without complaint today.    Past Medical History:  Diagnosis Date   Allergic rhinitis    Aortic atherosclerosis (HCC)    Arthritis    Carotid artery stenosis    1-39% bilateral dopplers 11/2022   Coronary artery disease 04/13/2008   Drug eluting stent in RCA // Myoview  04/2019: EF 52, normal perfusion; low risk   Hypercholesteremia    Hypertension    PAF (paroxysmal atrial fibrillation) (HCC)    RBBB    Renal disorder    Tobacco abuse    Current Outpatient Medications  Medication Sig Dispense Refill   albuterol  (VENTOLIN  HFA) 108 (90 Base) MCG/ACT inhaler Inhale 2 puffs into the lungs every 4 (four) hours as needed for shortness of breath.     amLODipine  (NORVASC ) 5 MG tablet TAKE 1 TABLET(5 MG) BY MOUTH DAILY 90 tablet 2   aspirin  EC 81 MG tablet Take 81 mg by mouth daily. Swallow whole.      atorvastatin  (LIPITOR) 20 MG tablet TAKE 1 TABLET(20 MG) BY MOUTH DAILY 90 tablet 2   Coenzyme Q10 (CO Q-10) 100 MG CAPS Take 1 capsule by mouth every morning.     cyanocobalamin  (VITAMIN B12) 1000 MCG tablet Take 1,000 mcg by mouth daily.     dronedarone  (MULTAQ ) 400 MG tablet Take 1 tablet (400 mg total) by mouth 2 (two) times daily with a meal. 60 tablet 6   ELIQUIS  5 MG TABS tablet TAKE 1 TABLET(5 MG) BY MOUTH TWICE DAILY 180 tablet 1   EPINEPHrine 0.3 mg/0.3 mL IJ SOAJ injection Inject 0.3 mg into the muscle as needed for anaphylaxis.     ferrous sulfate  325 (65 FE) MG tablet Take 1 tablet (325 mg total) by mouth 2 (two) times daily with a meal. 60 tablet 11   Fluticasone -Umeclidin-Vilant 100-62.5-25 MCG/ACT AEPB Inhale 1 puff into the lungs at bedtime. Not sure the dosage     furosemide (LASIX) 20 MG tablet Take 20 mg by mouth daily.     metoprolol  tartrate (LOPRESSOR ) 25 MG tablet TAKE 1/2 TABLET BY MOUTH TWICE DAILY (Patient taking differently: Take 25 mg by mouth 2 (two) times daily.) 90 tablet 2   oxyCODONE  (ROXICODONE ) 5 MG immediate release tablet Take 1 tablet (5 mg total) by mouth every 4 (four) hours as needed for severe pain (pain score 7-10) or breakthrough pain. 20 tablet 0   Current Facility-Administered Medications  Medication Dose Route Frequency Provider Last  Rate Last Admin   triamcinolone  acetonide (KENALOG ) 10 MG/ML injection 10 mg  10 mg Other Once Stover, Titorya, DPM        ROS- All systems are reviewed and negative except as per the HPI above  Physical Exam: Vitals:   08/11/23 1124  BP: 134/68  Pulse: (!) 51  Weight: 79.1 kg  Height: 5\' 9"  (1.753 m)     Wt Readings from Last 3 Encounters:  08/11/23 79.1 kg  07/08/23 78.3 kg  05/21/23 77.3 kg    GEN: Well nourished, well developed in no acute distress CARDIAC: Regular rate and rhythm, no murmurs, rubs, gallops RESPIRATORY:  Clear to auscultation without rales, wheezing or rhonchi  ABDOMEN: Soft,  non-tender, non-distended EXTREMITIES:  No edema; No deformity    EKG today demonstrates SB, RBBB Vent. rate 51 BPM PR interval 138 ms QRS duration 138 ms QT/QTcB 468/431 ms   Echo 03/21/23  1. Left ventricular ejection fraction, by estimation, is 60 to 65%. The  left ventricle has normal function. The left ventricle has no regional  wall motion abnormalities. Left ventricular diastolic parameters are  indeterminate.   2. Right ventricular systolic function is normal. The right ventricular  size is normal.   3. The mitral valve is normal in structure. Trivial mitral valve  regurgitation. No evidence of mitral stenosis.   4. The aortic valve is tricuspid. Aortic valve regurgitation is trivial.  No aortic stenosis is present.   5. The inferior vena cava is normal in size with greater than 50%  respiratory variability, suggesting right atrial pressure of 3 mmHg.   Comparison(s): No significant change from prior study. Prior images  reviewed side by side.    CHA2DS2-VASc Score = 4  The patient's score is based upon: CHF History: 0 HTN History: 1 Diabetes History: 0 Stroke History: 0 Vascular Disease History: 1 Age Score: 2 Gender Score: 0       ASSESSMENT AND PLAN: Paroxysmal Atrial Fibrillation (ICD10:  I48.0) The patient's CHA2DS2-VASc score is 4, indicating a 4.8% annual risk of stroke.   Patient appears to be maintaining SR. ? If uptick in afib was related to anemia.  Continue Multaq  400 mg BID Continue Eliquis  5 mg BID Decrease Lopressor  to 12.5 mg BID Could consider ablation or dofetilide if he has more frequent afib.   Secondary Hypercoagulable State (ICD10:  D68.69) The patient is at significant risk for stroke/thromboembolism based upon his CHA2DS2-VASc Score of 4.  Continue Apixaban  (Eliquis ).   High Risk Medication Monitoring (ICD 10: Z79.899) Intervals on ECG acceptable for dronedarone  monitoring.      CAD DES 2010 No anginal symptoms Followed by Dr  Micael Adas  HTN Stable on current regimen   Follow up in the AF clinic in 6 months.    Myrtha Ates PA-C Afib Clinic [facility] [address] [place], [place] [address] [phone]

## 2023-08-19 DIAGNOSIS — J449 Chronic obstructive pulmonary disease, unspecified: Secondary | ICD-10-CM | POA: Diagnosis not present

## 2023-08-19 DIAGNOSIS — I482 Chronic atrial fibrillation, unspecified: Secondary | ICD-10-CM | POA: Diagnosis not present

## 2023-08-19 DIAGNOSIS — I10 Essential (primary) hypertension: Secondary | ICD-10-CM | POA: Diagnosis not present

## 2023-08-19 DIAGNOSIS — R7303 Prediabetes: Secondary | ICD-10-CM | POA: Diagnosis not present

## 2023-08-19 DIAGNOSIS — F17210 Nicotine dependence, cigarettes, uncomplicated: Secondary | ICD-10-CM | POA: Diagnosis not present

## 2023-08-23 DIAGNOSIS — E7800 Pure hypercholesterolemia, unspecified: Secondary | ICD-10-CM | POA: Diagnosis not present

## 2023-08-23 DIAGNOSIS — I2510 Atherosclerotic heart disease of native coronary artery without angina pectoris: Secondary | ICD-10-CM | POA: Diagnosis not present

## 2023-08-23 DIAGNOSIS — M1991 Primary osteoarthritis, unspecified site: Secondary | ICD-10-CM | POA: Diagnosis not present

## 2023-08-23 DIAGNOSIS — J449 Chronic obstructive pulmonary disease, unspecified: Secondary | ICD-10-CM | POA: Diagnosis not present

## 2023-08-23 DIAGNOSIS — I480 Paroxysmal atrial fibrillation: Secondary | ICD-10-CM | POA: Diagnosis not present

## 2023-08-23 DIAGNOSIS — J441 Chronic obstructive pulmonary disease with (acute) exacerbation: Secondary | ICD-10-CM | POA: Diagnosis not present

## 2023-08-27 ENCOUNTER — Other Ambulatory Visit: Payer: Self-pay

## 2023-08-27 MED ORDER — AMLODIPINE BESYLATE 5 MG PO TABS: 5.0000 mg | ORAL_TABLET | Freq: Every day | ORAL | 0 refills | Status: DC

## 2023-09-10 DIAGNOSIS — J441 Chronic obstructive pulmonary disease with (acute) exacerbation: Secondary | ICD-10-CM | POA: Diagnosis not present

## 2023-09-10 DIAGNOSIS — I480 Paroxysmal atrial fibrillation: Secondary | ICD-10-CM | POA: Diagnosis not present

## 2023-09-10 DIAGNOSIS — I2510 Atherosclerotic heart disease of native coronary artery without angina pectoris: Secondary | ICD-10-CM | POA: Diagnosis not present

## 2023-09-10 DIAGNOSIS — J449 Chronic obstructive pulmonary disease, unspecified: Secondary | ICD-10-CM | POA: Diagnosis not present

## 2023-09-16 DIAGNOSIS — D692 Other nonthrombocytopenic purpura: Secondary | ICD-10-CM | POA: Diagnosis not present

## 2023-09-16 DIAGNOSIS — L814 Other melanin hyperpigmentation: Secondary | ICD-10-CM | POA: Diagnosis not present

## 2023-09-16 DIAGNOSIS — C44629 Squamous cell carcinoma of skin of left upper limb, including shoulder: Secondary | ICD-10-CM | POA: Diagnosis not present

## 2023-09-16 DIAGNOSIS — L821 Other seborrheic keratosis: Secondary | ICD-10-CM | POA: Diagnosis not present

## 2023-09-16 DIAGNOSIS — Z85828 Personal history of other malignant neoplasm of skin: Secondary | ICD-10-CM | POA: Diagnosis not present

## 2023-09-22 DIAGNOSIS — M1991 Primary osteoarthritis, unspecified site: Secondary | ICD-10-CM | POA: Diagnosis not present

## 2023-09-22 DIAGNOSIS — I480 Paroxysmal atrial fibrillation: Secondary | ICD-10-CM | POA: Diagnosis not present

## 2023-09-22 DIAGNOSIS — I2510 Atherosclerotic heart disease of native coronary artery without angina pectoris: Secondary | ICD-10-CM | POA: Diagnosis not present

## 2023-09-22 DIAGNOSIS — E7800 Pure hypercholesterolemia, unspecified: Secondary | ICD-10-CM | POA: Diagnosis not present

## 2023-10-07 DIAGNOSIS — Z85828 Personal history of other malignant neoplasm of skin: Secondary | ICD-10-CM | POA: Diagnosis not present

## 2023-10-07 DIAGNOSIS — C44629 Squamous cell carcinoma of skin of left upper limb, including shoulder: Secondary | ICD-10-CM | POA: Diagnosis not present

## 2023-10-10 DIAGNOSIS — J449 Chronic obstructive pulmonary disease, unspecified: Secondary | ICD-10-CM | POA: Diagnosis not present

## 2023-10-10 DIAGNOSIS — J441 Chronic obstructive pulmonary disease with (acute) exacerbation: Secondary | ICD-10-CM | POA: Diagnosis not present

## 2023-10-10 DIAGNOSIS — I480 Paroxysmal atrial fibrillation: Secondary | ICD-10-CM | POA: Diagnosis not present

## 2023-10-10 DIAGNOSIS — I2510 Atherosclerotic heart disease of native coronary artery without angina pectoris: Secondary | ICD-10-CM | POA: Diagnosis not present

## 2023-10-13 NOTE — Progress Notes (Unsigned)
 [facility] OFFICE PROGRESS NOTE  Chrystal Lamarr RAMAN, MD [address] [place] [address]  DIAGNOSIS: Anemia   PRIOR THERAPY: None  CURRENT THERAPY: 1) iron deficiency anemia  2) IV iron as needed with Feraheme, most recent on 05/21/2023.  INTERVAL HISTORY: Julian Weaver 78 y.o. male returns to the clinic today for a follow-up visit.  The patient establish care in the clinic on 05/06/2023.  The patient is followed for iron deficiency anemia. The patient has been evaluated by gastroenterology and had EGD and colonoscopy which were negative for bleeding.  He also had a capsule endoscopy for which the patient states that it was negative for any etiology of heme positive stool.   Therefore he takes an iron supplement BID p.o. daily and is compliant.  He tolerates this well.  He also receives IV iron as needed the most recent being with Feraheme on 05/21/2023.  He tolerated this well.  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 He sees Dr. Jadine from [facility]. The patient does not believe he needed to see medical oncology for this and did not require lymph node dissection from the sound of it. I do not have his pathology.    Overall the patient is feeling well. Denies any dyspnea on exertion.  Denies any syncope or visible bleeding.  He is here today for evaluation and repeat blood work.  MEDICAL HISTORY: Past Medical History:  Diagnosis Date   Allergic rhinitis    Aortic atherosclerosis (HCC)    Arthritis    Carotid artery stenosis    1-39% bilateral dopplers 11/2022   Coronary artery disease 04/13/2008   Drug eluting stent in RCA // Myoview  04/2019: EF 52, normal perfusion; low risk   Hypercholesteremia    Hypertension    PAF (paroxysmal atrial fibrillation) (HCC)    RBBB    Renal disorder    Tobacco abuse     ALLERGIES:  is allergic to bee venom, amoxicillin-pot clavulanate, hydromorphone , and  tramadol .  MEDICATIONS:  Current Outpatient Medications  Medication Sig Dispense Refill   albuterol  (VENTOLIN  HFA) 108 (90 Base) MCG/ACT inhaler Inhale 2 puffs into the lungs every 4 (four) hours as needed for shortness of breath.     amLODipine  (NORVASC ) 5 MG tablet Take 1 tablet (5 mg total) by mouth daily. 90 tablet 0   aspirin  EC 81 MG tablet Take 81 mg by mouth daily. Swallow whole.     atorvastatin  (LIPITOR) 20 MG tablet TAKE 1 TABLET(20 MG) BY MOUTH DAILY 90 tablet 2   Coenzyme Q10 (CO Q-10) 100 MG CAPS Take 1 capsule by mouth every morning.     cyanocobalamin  (VITAMIN B12) 1000 MCG tablet Take 1,000 mcg by mouth daily.     dronedarone  (MULTAQ ) 400 MG tablet Take 1 tablet (400 mg total) by mouth 2 (two) times daily with a meal. 60 tablet 6   ELIQUIS  5 MG TABS tablet TAKE 1 TABLET(5 MG) BY MOUTH TWICE DAILY 180 tablet 1   EPINEPHrine 0.3 mg/0.3 mL IJ SOAJ injection Inject 0.3 mg into the muscle as needed for anaphylaxis.     ferrous sulfate  325 (65 FE) MG tablet Take 1 tablet (325 mg total) by mouth 2 (two) times daily with a meal. 60 tablet 11   Fluticasone -Umeclidin-Vilant 100-62.5-25 MCG/ACT AEPB Inhale 1 puff into the lungs at bedtime. Not sure the dosage     furosemide (LASIX) 20 MG tablet Take 20 mg by mouth daily.  metoprolol  tartrate (LOPRESSOR ) 25 MG tablet Take 0.5 tablets (12.5 mg total) by mouth 2 (two) times daily. 90 tablet 2   oxyCODONE  (ROXICODONE ) 5 MG immediate release tablet Take 1 tablet (5 mg total) by mouth every 4 (four) hours as needed for severe pain (pain score 7-10) or breakthrough pain. 20 tablet 0   Current Facility-Administered Medications  Medication Dose Route Frequency Provider Last Rate Last Admin   triamcinolone  acetonide (KENALOG ) 10 MG/ML injection 10 mg  10 mg Other Once Burt Fus, DPM        SURGICAL HISTORY:  Past Surgical History:  Procedure Laterality Date   CAROTID STENT  2010   COLONOSCOPY N/A 03/28/2023   Procedure: COLONOSCOPY;   Surgeon: Kriss Estefana DEL, DO;  Location: [facility] ENDOSCOPY;  Service: Gastroenterology;  Laterality: N/A;   ESOPHAGOGASTRODUODENOSCOPY N/A 03/28/2023   Procedure: ESOPHAGOGASTRODUODENOSCOPY (EGD);  Surgeon: Kriss Estefana DEL, DO;  Location: [facility] ENDOSCOPY;  Service: Gastroenterology;  Laterality: N/A;   SHOULDER ACROMIOPLASTY  2010    REVIEW OF SYSTEMS:   Review of Systems  Constitutional: Negative for appetite change, chills, fatigue, fever and unexpected weight change.  HENT:   Negative for mouth sores, nosebleeds, sore throat and trouble swallowing.   Eyes: Negative for eye problems and icterus.  Respiratory: Negative for cough, hemoptysis, shortness of breath and wheezing.   Cardiovascular: Negative for chest pain and leg swelling.  Gastrointestinal: Negative for abdominal pain, constipation, diarrhea, nausea and vomiting.  Genitourinary: Negative for bladder incontinence, difficulty urinating, dysuria, frequency and hematuria.   Musculoskeletal: Negative for back pain, gait problem, neck pain and neck stiffness.  Skin: Bandage on left hand from recent melanoma excision. Negative for itching and rash.  Neurological: Negative for dizziness, extremity weakness, gait problem, headaches, light-headedness and seizures.  Hematological: Negative for adenopathy. Does not bruise/bleed easily.  Psychiatric/Behavioral: Negative for confusion, depression and sleep disturbance. The patient is not nervous/anxious.     PHYSICAL EXAMINATION:  There were no vitals taken for this visit.  ECOG PERFORMANCE STATUS: 0  Physical Exam  Constitutional: Oriented to person, place, and time and well-developed, well-nourished, and in no distress.  HENT:  Head: Normocephalic and atraumatic.  Mouth/Throat: Oropharynx is clear and moist. No oropharyngeal exudate.  Eyes: Conjunctivae are normal. Right eye exhibits no discharge. Left eye exhibits no discharge. No scleral icterus.  Neck: Normal range of motion. Neck  supple.  Cardiovascular: Normal rate, regular rhythm, normal heart sounds and intact distal pulses.   Pulmonary/Chest: Effort normal and breath sounds normal. No respiratory distress. No wheezes. No rales.  Abdominal: Soft. Bowel sounds are normal. Exhibits no distension and no mass. There is no tenderness.  Musculoskeletal: Normal range of motion. Exhibits no edema.  Lymphadenopathy:    No cervical adenopathy.  Neurological: Alert and oriented to person, place, and time. Exhibits normal muscle tone. Gait normal. Coordination normal.  Skin: Bandage on left hand from recent melanoma excision. Skin is warm and dry. No rash noted. Not diaphoretic. No erythema. No pallor.  Psychiatric: Mood, memory and judgment normal.  Vitals reviewed.  LABORATORY DATA: Lab Results  Component Value Date   WBC 8.4 07/08/2023   HGB 15.3 07/08/2023   HCT 44.8 07/08/2023   MCV 88.5 07/08/2023   PLT 190 07/08/2023      Chemistry      Component Value Date/Time   NA 137 05/06/2023 1324   NA 140 03/20/2020 0831   K 3.9 05/06/2023 1324   CL 103 05/06/2023 1324   CO2 28 05/06/2023  1324   BUN 14 05/06/2023 1324   BUN 13 03/20/2020 0831   CREATININE 1.06 05/06/2023 1324      Component Value Date/Time   CALCIUM  9.0 05/06/2023 1324   ALKPHOS 87 05/06/2023 1324   AST 18 05/06/2023 1324   ALT 15 05/06/2023 1324   BILITOT 0.7 05/06/2023 1324       RADIOGRAPHIC STUDIES:  No results found.   ASSESSMENT/PLAN:  This a very pleasant 78 year old male referred to the clinic for anemia.  The patient presented in December 2024.   He had a thorough evaluation performed by gastroenterology including colonoscopy, EGD, and capsule endoscopy which were negative for bleeding.   The patient receives IV iron as needed with Feraheme.  The most recent dose was on 05/21/23.    He also takes an iron supplement with BID. daily.   The patient had repeat labs performed today.  His labs show normal hemoglobin. His  ferritin and iron studies are pending at this time. I will send him mychart message with results.    It is unlikely that the patient will require any IV iron at this time.  He was instructed to continue taking his iron supplements.   We will see him back for labs and follow-up in 4-5 months.  Of course, if he ever has worsening signs and symptoms of anemia he knows he can call us  sooner.  The patient was advised to call immediately if he has any concerning symptoms in the interval. The patient voices understanding of current disease status and treatment options and is in agreement with the current care plan. All questions were answered. The patient knows to call the clinic with any problems, questions or concerns. We can certainly see the patient much sooner if necessary    No orders of the defined types were placed in this encounter.    The total time spent in the appointment was 20-29 minutes  Jesselee Poth L Jahnaya Branscome, PA-C 10/13/23

## 2023-10-17 ENCOUNTER — Encounter: Payer: Self-pay | Admitting: Physician Assistant

## 2023-10-17 ENCOUNTER — Inpatient Hospital Stay: Attending: Physician Assistant

## 2023-10-17 ENCOUNTER — Telehealth: Payer: Self-pay

## 2023-10-17 ENCOUNTER — Inpatient Hospital Stay: Admitting: Physician Assistant

## 2023-10-17 VITALS — BP 125/66 | HR 53 | Temp 98.0°F | Resp 13 | Wt 174.7 lb

## 2023-10-17 DIAGNOSIS — D509 Iron deficiency anemia, unspecified: Secondary | ICD-10-CM | POA: Diagnosis not present

## 2023-10-17 LAB — SAMPLE TO BLOOD BANK

## 2023-10-17 LAB — CBC WITH DIFFERENTIAL (CANCER CENTER ONLY)
Abs Immature Granulocytes: 0.08 K/uL — ABNORMAL HIGH (ref 0.00–0.07)
Basophils Absolute: 0.1 K/uL (ref 0.0–0.1)
Basophils Relative: 1 %
Eosinophils Absolute: 0.3 K/uL (ref 0.0–0.5)
Eosinophils Relative: 3 %
HCT: 43.3 % (ref 39.0–52.0)
Hemoglobin: 15.2 g/dL (ref 13.0–17.0)
Immature Granulocytes: 1 %
Lymphocytes Relative: 15 %
Lymphs Abs: 1.4 K/uL (ref 0.7–4.0)
MCH: 32.7 pg (ref 26.0–34.0)
MCHC: 35.1 g/dL (ref 30.0–36.0)
MCV: 93.1 fL (ref 80.0–100.0)
Monocytes Absolute: 0.8 K/uL (ref 0.1–1.0)
Monocytes Relative: 8 %
Neutro Abs: 7.1 K/uL (ref 1.7–7.7)
Neutrophils Relative %: 72 %
Platelet Count: 187 K/uL (ref 150–400)
RBC: 4.65 MIL/uL (ref 4.22–5.81)
RDW: 13.4 % (ref 11.5–15.5)
WBC Count: 9.7 K/uL (ref 4.0–10.5)
nRBC: 0 % (ref 0.0–0.2)

## 2023-10-17 LAB — IRON AND IRON BINDING CAPACITY (CC-WL,HP ONLY)
Iron: 82 ug/dL (ref 45–182)
Saturation Ratios: 24 % (ref 17.9–39.5)
TIBC: 349 ug/dL (ref 250–450)
UIBC: 267 ug/dL (ref 117–376)

## 2023-10-17 LAB — FERRITIN: Ferritin: 126 ng/mL (ref 24–336)

## 2023-10-17 NOTE — Telephone Encounter (Signed)
 [facility], per [employer], PA-C. Requested the path report of patient's recent melanoma excision on his hand x Dr. Jadine. Was advised that the report will be faxed to F#[phone].  Path report received and given to [facility], PA-C.

## 2023-10-22 DIAGNOSIS — H90A32 Mixed conductive and sensorineural hearing loss, unilateral, left ear with restricted hearing on the contralateral side: Secondary | ICD-10-CM | POA: Diagnosis not present

## 2023-10-22 DIAGNOSIS — H6522 Chronic serous otitis media, left ear: Secondary | ICD-10-CM | POA: Diagnosis not present

## 2023-10-22 DIAGNOSIS — H6993 Unspecified Eustachian tube disorder, bilateral: Secondary | ICD-10-CM | POA: Diagnosis not present

## 2023-10-22 DIAGNOSIS — H90A21 Sensorineural hearing loss, unilateral, right ear, with restricted hearing on the contralateral side: Secondary | ICD-10-CM | POA: Diagnosis not present

## 2023-10-23 DIAGNOSIS — I480 Paroxysmal atrial fibrillation: Secondary | ICD-10-CM | POA: Diagnosis not present

## 2023-10-23 DIAGNOSIS — J441 Chronic obstructive pulmonary disease with (acute) exacerbation: Secondary | ICD-10-CM | POA: Diagnosis not present

## 2023-10-23 DIAGNOSIS — J449 Chronic obstructive pulmonary disease, unspecified: Secondary | ICD-10-CM | POA: Diagnosis not present

## 2023-10-23 DIAGNOSIS — E7800 Pure hypercholesterolemia, unspecified: Secondary | ICD-10-CM | POA: Diagnosis not present

## 2023-10-23 DIAGNOSIS — I2510 Atherosclerotic heart disease of native coronary artery without angina pectoris: Secondary | ICD-10-CM | POA: Diagnosis not present

## 2023-10-23 DIAGNOSIS — M1991 Primary osteoarthritis, unspecified site: Secondary | ICD-10-CM | POA: Diagnosis not present

## 2023-10-26 ENCOUNTER — Other Ambulatory Visit: Payer: Self-pay | Admitting: Cardiology

## 2023-10-30 DIAGNOSIS — H6522 Chronic serous otitis media, left ear: Secondary | ICD-10-CM | POA: Diagnosis not present

## 2023-10-30 DIAGNOSIS — H90A32 Mixed conductive and sensorineural hearing loss, unilateral, left ear with restricted hearing on the contralateral side: Secondary | ICD-10-CM | POA: Diagnosis not present

## 2023-11-09 DIAGNOSIS — J449 Chronic obstructive pulmonary disease, unspecified: Secondary | ICD-10-CM | POA: Diagnosis not present

## 2023-11-09 DIAGNOSIS — I2510 Atherosclerotic heart disease of native coronary artery without angina pectoris: Secondary | ICD-10-CM | POA: Diagnosis not present

## 2023-11-09 DIAGNOSIS — J441 Chronic obstructive pulmonary disease with (acute) exacerbation: Secondary | ICD-10-CM | POA: Diagnosis not present

## 2023-11-09 DIAGNOSIS — I480 Paroxysmal atrial fibrillation: Secondary | ICD-10-CM | POA: Diagnosis not present

## 2023-11-22 ENCOUNTER — Other Ambulatory Visit: Payer: Self-pay | Admitting: Cardiology

## 2023-11-23 DIAGNOSIS — E7800 Pure hypercholesterolemia, unspecified: Secondary | ICD-10-CM | POA: Diagnosis not present

## 2023-11-23 DIAGNOSIS — J449 Chronic obstructive pulmonary disease, unspecified: Secondary | ICD-10-CM | POA: Diagnosis not present

## 2023-11-23 DIAGNOSIS — I480 Paroxysmal atrial fibrillation: Secondary | ICD-10-CM | POA: Diagnosis not present

## 2023-11-23 DIAGNOSIS — I2510 Atherosclerotic heart disease of native coronary artery without angina pectoris: Secondary | ICD-10-CM | POA: Diagnosis not present

## 2023-11-23 DIAGNOSIS — J441 Chronic obstructive pulmonary disease with (acute) exacerbation: Secondary | ICD-10-CM | POA: Diagnosis not present

## 2023-11-23 DIAGNOSIS — M1991 Primary osteoarthritis, unspecified site: Secondary | ICD-10-CM | POA: Diagnosis not present

## 2023-12-01 DIAGNOSIS — H6522 Chronic serous otitis media, left ear: Secondary | ICD-10-CM | POA: Diagnosis not present

## 2023-12-01 DIAGNOSIS — H903 Sensorineural hearing loss, bilateral: Secondary | ICD-10-CM | POA: Diagnosis not present

## 2023-12-01 DIAGNOSIS — Z9622 Myringotomy tube(s) status: Secondary | ICD-10-CM | POA: Diagnosis not present

## 2023-12-09 DIAGNOSIS — I480 Paroxysmal atrial fibrillation: Secondary | ICD-10-CM | POA: Diagnosis not present

## 2023-12-09 DIAGNOSIS — J441 Chronic obstructive pulmonary disease with (acute) exacerbation: Secondary | ICD-10-CM | POA: Diagnosis not present

## 2023-12-09 DIAGNOSIS — J449 Chronic obstructive pulmonary disease, unspecified: Secondary | ICD-10-CM | POA: Diagnosis not present

## 2023-12-09 DIAGNOSIS — I2510 Atherosclerotic heart disease of native coronary artery without angina pectoris: Secondary | ICD-10-CM | POA: Diagnosis not present

## 2023-12-18 ENCOUNTER — Encounter: Payer: Self-pay | Admitting: Cardiology

## 2023-12-18 ENCOUNTER — Ambulatory Visit (HOSPITAL_COMMUNITY)
Admission: RE | Admit: 2023-12-18 | Discharge: 2023-12-18 | Disposition: A | Discharge status: Home / Self Care | Source: Ambulatory Visit | Attending: Cardiology | Admitting: Cardiology

## 2023-12-18 ENCOUNTER — Ambulatory Visit: Payer: Self-pay | Admitting: Cardiology

## 2023-12-18 DIAGNOSIS — I6523 Occlusion and stenosis of bilateral carotid arteries: Secondary | ICD-10-CM | POA: Insufficient documentation

## 2023-12-19 ENCOUNTER — Other Ambulatory Visit: Payer: Self-pay

## 2023-12-19 DIAGNOSIS — R0989 Other specified symptoms and signs involving the circulatory and respiratory systems: Secondary | ICD-10-CM

## 2023-12-21 ENCOUNTER — Other Ambulatory Visit: Payer: Self-pay | Admitting: Cardiology

## 2023-12-22 ENCOUNTER — Other Ambulatory Visit: Payer: Self-pay

## 2023-12-22 DIAGNOSIS — I6523 Occlusion and stenosis of bilateral carotid arteries: Secondary | ICD-10-CM

## 2023-12-22 NOTE — Telephone Encounter (Signed)
 Prescription refill request for Eliquis  received. Indication: PAF Last office visit: 08/11/23  C Fenton PA Scr: 1.06 on 05/06/23  Epic Age: 78 Weight: 79.1kg  Based on above findings Eliquis  5mg  twice daily is the appropriate dose.  Refill approved.

## 2023-12-23 DIAGNOSIS — I2510 Atherosclerotic heart disease of native coronary artery without angina pectoris: Secondary | ICD-10-CM | POA: Diagnosis not present

## 2023-12-23 DIAGNOSIS — E7800 Pure hypercholesterolemia, unspecified: Secondary | ICD-10-CM | POA: Diagnosis not present

## 2023-12-23 DIAGNOSIS — I480 Paroxysmal atrial fibrillation: Secondary | ICD-10-CM | POA: Diagnosis not present

## 2023-12-23 DIAGNOSIS — J449 Chronic obstructive pulmonary disease, unspecified: Secondary | ICD-10-CM | POA: Diagnosis not present

## 2023-12-23 DIAGNOSIS — J441 Chronic obstructive pulmonary disease with (acute) exacerbation: Secondary | ICD-10-CM | POA: Diagnosis not present

## 2023-12-23 DIAGNOSIS — M1991 Primary osteoarthritis, unspecified site: Secondary | ICD-10-CM | POA: Diagnosis not present

## 2023-12-25 ENCOUNTER — Encounter: Payer: Self-pay | Admitting: Podiatry

## 2023-12-25 ENCOUNTER — Ambulatory Visit (INDEPENDENT_AMBULATORY_CARE_PROVIDER_SITE_OTHER): Admitting: Podiatry

## 2023-12-25 DIAGNOSIS — M7751 Other enthesopathy of right foot: Secondary | ICD-10-CM

## 2023-12-25 MED ORDER — TRIAMCINOLONE ACETONIDE 10 MG/ML IJ SUSP
10.0000 mg | Freq: Once | INTRAMUSCULAR | Status: AC
  Administered 2023-12-25: 10 mg via INTRA_ARTICULAR

## 2023-12-26 NOTE — Progress Notes (Signed)
 Subjective:   Patient ID: Julian Weaver, male   DOB: 78 y.o.   MRN: [id number]   HPI Patient states he is getting a lot of pain around his big toe joint right and especially in the plantar with lesion that has formed with patient having varus deformity and significant hallux valgus deformity   ROS      Objective:  Physical Exam  Neurovascular status found to be intact with significant structural mallet's deformity pressure against the right plantar hallux and first metatarsal with fluid buildup around the first metatarsal     Assessment:  Inflammatory capsulitis of the plantar first MPJ with structural changes of the big part of the problem     Plan:  H&P reviewed and I recommended injection with careful injection administered today plantar capsule 3 mg dexamethasone Kenalog  5 mg Xylocaine  debrided lesions sterile dressing applied will be seen back ultimately may require surgery with the probability for first MPJ fusion

## 2024-01-08 DIAGNOSIS — J449 Chronic obstructive pulmonary disease, unspecified: Secondary | ICD-10-CM | POA: Diagnosis not present

## 2024-01-08 DIAGNOSIS — J441 Chronic obstructive pulmonary disease with (acute) exacerbation: Secondary | ICD-10-CM | POA: Diagnosis not present

## 2024-01-08 DIAGNOSIS — I2510 Atherosclerotic heart disease of native coronary artery without angina pectoris: Secondary | ICD-10-CM | POA: Diagnosis not present

## 2024-01-08 DIAGNOSIS — I480 Paroxysmal atrial fibrillation: Secondary | ICD-10-CM | POA: Diagnosis not present

## 2024-01-13 ENCOUNTER — Ambulatory Visit: Admitting: Physician Assistant

## 2024-01-13 ENCOUNTER — Encounter: Payer: Self-pay | Admitting: Physician Assistant

## 2024-01-13 ENCOUNTER — Other Ambulatory Visit: Payer: Self-pay

## 2024-01-13 DIAGNOSIS — M25561 Pain in right knee: Secondary | ICD-10-CM

## 2024-01-13 MED ORDER — LIDOCAINE HCL 1 % IJ SOLN
5.0000 mL | INTRAMUSCULAR | Status: AC | PRN
  Administered 2024-01-13: 5 mL

## 2024-01-13 MED ORDER — METHYLPREDNISOLONE ACETATE 40 MG/ML IJ SUSP
40.0000 mg | INTRAMUSCULAR | Status: AC | PRN
  Administered 2024-01-13: 40 mg via INTRA_ARTICULAR

## 2024-01-13 NOTE — Progress Notes (Signed)
 Office Visit Note   Patient: Julian Weaver           Date of Birth: [birth year]           MRN: [id number] Visit Date: 01/13/2024              Requested by: Chrystal Lamarr RAMAN, MD [phone] [facility] [address] [place],  [place] PCP: Chrystal Lamarr RAMAN, MD   Assessment & Plan: Visit Diagnoses:  1. Right knee pain, unspecified chronicity     Plan: Patient is a pleasant 78 year old gentleman who used to be a patient of Dr. Anderson.  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   Orders:  Orders Placed This Encounter  Procedures  . XR KNEE 3 VIEW RIGHT   No orders of the defined types were placed in this encounter.     Procedures: Large Joint Inj: R knee on 01/13/2024 8:33 AM Indications: pain and diagnostic evaluation Details: 25 G 1.5 in needle, superolateral approach  Arthrogram: No  Medications: 40 mg methylPREDNISolone  acetate 40 MG/ML; 5 mL lidocaine  1 % Aspirate: 90 mL yellow Outcome: tolerated well, no immediate complications  After obtaining verbal consent superior lateral pouch of the right knee was prepped with alcohol and Betadine.  4 cc of lidocaine  plain was injected.  After adequate analgesia area was reprepped with alcohol and Betadine.  18-gauge needle was inserted.  90 cc of yellow fluid was aspirated.  He was then injected with 40 mg of Depo-Medrol  and another cc of lidocaine  tolerated the procedure well self ambulated from the clinic Procedure, treatment alternatives, risks and benefits explained, specific risks discussed. Consent was given by the patient.      Clinical Data: No additional findings.   Subjective: No chief complaint on file.   HPI pleasant active 78 year old gentleman who still works comes in today with an effusion and swelling in his right knee has had this before has a history of CPPD he is  requesting drainage and steroid injection if possible  Review of Systems  All other systems reviewed and are negative.    Objective: Vital Signs: There were no vitals taken for this visit.  Physical Exam Constitutional:      Appearance: Normal appearance.  Pulmonary:     Effort: Pulmonary effort is normal.  Skin:    General: Skin is warm and dry.  Neurological:     General: No focal deficit present.     Mental Status: He is alert and oriented to person, place, and time.     Ortho Exam Right knee no erythema moderate large effusion.  Compartments are soft and compressible is neurovascularly intact grinding with range of motion.  No evidence of an infective process Specialty Comments:  No specialty comments available.  Imaging: XR KNEE 3 VIEW RIGHT Result Date: 01/13/2024 Three-view radiographs of the right knee demonstrate degenerative changes with periarticular osteophytes and sclerotic changes..  Findings also consistent with CPPD    PMFS History: Patient Active Problem List   Diagnosis Date Noted  . Anemia 07/08/2023  . Iron deficiency anemia 05/06/2023  . Colon, diverticulosis 03/28/2023  . Internal hemorrhoids 03/28/2023  . Syncope 03/26/2023  . Postural dizziness 03/26/2023  . Hyperlipidemia 03/26/2023  . History of COPD 03/26/2023  . Acute blood loss anemia 03/26/2023  . Symptomatic anemia 03/25/2023  . Paroxysmal atrial fibrillation (HCC) 11/06/2022  .  Hypercoagulable state due to paroxysmal atrial fibrillation (HCC) 11/06/2022  . Spondylosis without myelopathy or radiculopathy, cervical region 06/25/2022  . Aortic atherosclerosis 11/22/2021  . Pain in left knee 05/15/2016  . Swelling of left knee joint 05/15/2016  . Carotid artery bruit 04/01/2016  . Carotid artery stenosis   . Cigarette smoker 10/27/2014  . 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 Left inguinal hernia 11/12/2013  . Tobacco abuse 03/12/2013  . Coronary  atherosclerosis of native coronary artery 03/11/2013  . Pure hypercholesterolemia 03/11/2013  . Essential hypertension 03/11/2013  . Encounter for long-term (current) use of other medications 03/11/2013  . Bruit 03/11/2013   Past Medical History:  Diagnosis Date  . Allergic rhinitis   . Aortic atherosclerosis   . Arthritis   . Carotid artery stenosis    1-39% bilateral dopplers 11/2023  . Coronary artery disease 04/13/2008   Drug eluting stent in RCA // Myoview  04/2019: EF 52, normal perfusion; low risk  . Hypercholesteremia   . Hypertension   . PAF (paroxysmal atrial fibrillation) (HCC)   . RBBB   . Renal disorder   . Tobacco abuse     Family History  Problem Relation Age of Onset  . Stroke Mother   . Stroke Father     Past Surgical History:  Procedure Laterality Date  . CAROTID STENT  2010  . COLONOSCOPY N/A 03/28/2023   Procedure: COLONOSCOPY;  Surgeon: Kriss Estefana DEL, DO;  Location: [facility] ENDOSCOPY;  Service: Gastroenterology;  Laterality: N/A;  . ESOPHAGOGASTRODUODENOSCOPY N/A 03/28/2023   Procedure: ESOPHAGOGASTRODUODENOSCOPY (EGD);  Surgeon: Kriss Estefana DEL, DO;  Location: [facility] ENDOSCOPY;  Service: Gastroenterology;  Laterality: N/A;  . SHOULDER ACROMIOPLASTY  2010   Social History   Occupational History  . Not on file  Tobacco Use  . Smoking status: Every Day    Current packs/day: 1.00    Average packs/day: 1 pack/day for 57.2 years (57.2 ttl pk-yrs)    Types: Cigarettes    Start date: 10/27/1982  . Smokeless tobacco: Never  . Tobacco comments:    Half-pack daily 11/09/2020  Substance and Sexual Activity  . Alcohol use: Yes    Alcohol/week: 1.0 standard drink of alcohol    Types: 1 Cans of beer per week    Comment: Rarely- 3 times a month  . Drug use: No  . Sexual activity: Not on file

## 2024-01-23 DIAGNOSIS — I2510 Atherosclerotic heart disease of native coronary artery without angina pectoris: Secondary | ICD-10-CM | POA: Diagnosis not present

## 2024-01-23 DIAGNOSIS — M1991 Primary osteoarthritis, unspecified site: Secondary | ICD-10-CM | POA: Diagnosis not present

## 2024-01-23 DIAGNOSIS — J449 Chronic obstructive pulmonary disease, unspecified: Secondary | ICD-10-CM | POA: Diagnosis not present

## 2024-01-23 DIAGNOSIS — J441 Chronic obstructive pulmonary disease with (acute) exacerbation: Secondary | ICD-10-CM | POA: Diagnosis not present

## 2024-01-23 DIAGNOSIS — E7800 Pure hypercholesterolemia, unspecified: Secondary | ICD-10-CM | POA: Diagnosis not present

## 2024-01-23 DIAGNOSIS — I480 Paroxysmal atrial fibrillation: Secondary | ICD-10-CM | POA: Diagnosis not present

## 2024-01-26 ENCOUNTER — Encounter: Payer: Self-pay | Admitting: Radiology

## 2024-01-27 ENCOUNTER — Encounter: Payer: Self-pay | Admitting: Physician Assistant

## 2024-01-27 ENCOUNTER — Ambulatory Visit (INDEPENDENT_AMBULATORY_CARE_PROVIDER_SITE_OTHER): Admitting: Physician Assistant

## 2024-01-27 ENCOUNTER — Other Ambulatory Visit (INDEPENDENT_AMBULATORY_CARE_PROVIDER_SITE_OTHER): Payer: Self-pay

## 2024-01-27 DIAGNOSIS — S76312A Strain of muscle, fascia and tendon of the posterior muscle group at thigh level, left thigh, initial encounter: Secondary | ICD-10-CM | POA: Insufficient documentation

## 2024-01-27 DIAGNOSIS — M25552 Pain in left hip: Secondary | ICD-10-CM

## 2024-01-27 MED ORDER — METHYLPREDNISOLONE 4 MG PO TBPK: ORAL_TABLET | ORAL | 0 refills | Status: DC

## 2024-01-27 NOTE — Progress Notes (Signed)
 Office Visit Note   Patient: Julian Weaver           Date of Birth: January 04, 1946           MRN: [id number] Visit Date: 01/27/2024              Requested by: Chrystal Lamarr RAMAN, MD [phone] [facility] [address] [place],  [place] PCP: Chrystal Lamarr RAMAN, MD   Assessment & Plan: Visit Diagnoses:  1. Pain in left hip     Plan: Patient is a pleasant 78 year old gentleman who comes in today 2 days after lifting his leg up to step on an elevated rest on his truck.  He felt a pull and pop just below his left buttock.  Since then he has had significant pain just below the buttock crease that radiates down into the leg.  No loss of bowel or bladder control no paresthesias or radicular findings has some pain anteriorly 2.  I think he does by x-ray have some arthritis but I think is most painful symptom is related to hamstring strain proximal.  I will place him on a steroid Dosepak as he cannot take anti-inflammatories.  He understands to take this with food can take Tylenol  in between would like to see him back in 2 weeks.  Should alternate ice and heat should call me if anything gets worse.  At next visit would consider referring him to physical therapy  Follow-Up Instructions: No follow-ups on file.   Orders:  Orders Placed This Encounter  Procedures   XR HIP UNILAT W OR W/O PELVIS 1V LEFT   No orders of the defined types were placed in this encounter.     Procedures: No procedures performed   Clinical Data: No additional findings.   Subjective: Chief Complaint  Patient presents with   Left Hip - Pain    HPI pleasant 78 year old gentleman who is status post lifting his leg up to get up onto a tall step while working on his truck.  When he pulled himself up he felt a pop in the posterior lower buttock at the buttock.  He then had significant pain from the bottom of his buttock down his leg.  Hurts to sit on that side.  Review of Systems  All other systems reviewed and are  negative.    Objective: Vital Signs: There were no vitals taken for this visit.  Physical Exam Constitutional:      Appearance: Normal appearance.  Pulmonary:     Effort: Pulmonary effort is normal.  Skin:    General: Skin is warm and dry.  Neurological:     General: No focal deficit present.     Mental Status: He is alert and oriented to person, place, and time.  Psychiatric:        Mood and Affect: Mood normal.        Behavior: Behavior normal.     Ortho Exam Examination he has some stiffness in the hip but minimal pain with manipulation.  He does have pain with resisted extension and flexion that reproduces the pain posteriorly he does have some pain anteriorly cannot appreciate any masses.  He is neurovascularly intact strength is intact Specialty Comments:  No specialty comments available.  Imaging: No results found.   PMFS History: Patient Active Problem List   Diagnosis Date Noted   Anemia 07/08/2023   Iron deficiency anemia 05/06/2023   Colon, diverticulosis 03/28/2023   Internal hemorrhoids 03/28/2023   Syncope 03/26/2023  Postural dizziness 03/26/2023   Hyperlipidemia 03/26/2023   History of COPD 03/26/2023   Acute blood loss anemia 03/26/2023   Symptomatic anemia 03/25/2023   Paroxysmal atrial fibrillation (HCC) 11/06/2022   Hypercoagulable state due to paroxysmal atrial fibrillation (HCC) 11/06/2022   Spondylosis without myelopathy or radiculopathy, cervical region 06/25/2022   Aortic atherosclerosis 11/22/2021   Pain in left knee 05/15/2016   Swelling of left knee joint 05/15/2016   Carotid artery bruit 04/01/2016   Carotid artery stenosis    Cigarette smoker 10/27/2014   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   Left inguinal hernia 11/12/2013   Tobacco abuse 03/12/2013   Coronary atherosclerosis of native coronary artery 03/11/2013   Pure hypercholesterolemia 03/11/2013   Essential hypertension 03/11/2013   Encounter for  long-term (current) use of other medications 03/11/2013   Bruit 03/11/2013   Past Medical History:  Diagnosis Date   Allergic rhinitis    Aortic atherosclerosis    Arthritis    Carotid artery stenosis    1-39% bilateral dopplers 11/2023   Coronary artery disease 04/13/2008   Drug eluting stent in RCA // Myoview  04/2019: EF 52, normal perfusion; low risk   Hypercholesteremia    Hypertension    PAF (paroxysmal atrial fibrillation) (HCC)    RBBB    Renal disorder    Tobacco abuse     Family History  Problem Relation Age of Onset   Stroke Mother    Stroke Father     Past Surgical History:  Procedure Laterality Date   CAROTID STENT  2010   COLONOSCOPY N/A 03/28/2023   Procedure: COLONOSCOPY;  Surgeon: Kriss Estefana DEL, DO;  Location: [facility] ENDOSCOPY;  Service: Gastroenterology;  Laterality: N/A;   ESOPHAGOGASTRODUODENOSCOPY N/A 03/28/2023   Procedure: ESOPHAGOGASTRODUODENOSCOPY (EGD);  Surgeon: Kriss Estefana DEL, DO;  Location: [facility] ENDOSCOPY;  Service: Gastroenterology;  Laterality: N/A;   SHOULDER ACROMIOPLASTY  2010   Social History   Occupational History   Not on file  Tobacco Use   Smoking status: Every Day    Current packs/day: 1.00    Average packs/day: 1 pack/day for 57.3 years (57.3 ttl pk-yrs)    Types: Cigarettes    Start date: 10/27/1982   Smokeless tobacco: Never   Tobacco comments:    Half-pack daily 11/09/2020  Substance and Sexual Activity   Alcohol use: Yes    Alcohol/week: 1.0 standard drink of alcohol    Types: 1 Cans of beer per week    Comment: Rarely- 3 times a month   Drug use: No   Sexual activity: Not on file

## 2024-01-31 DIAGNOSIS — Z23 Encounter for immunization: Secondary | ICD-10-CM | POA: Diagnosis not present

## 2024-02-06 NOTE — Progress Notes (Signed)
 [facility] OFFICE PROGRESS NOTE  Chrystal Lamarr RAMAN, MD [address] [place] [address]  DIAGNOSIS:  Anemia   PRIOR THERAPY: None  CURRENT THERAPY:  1) iron deficiency anemia  2) IV iron as needed with Feraheme, most recent on 05/21/2023.  INTERVAL HISTORY: Julian Weaver 78 y.o. male returns to the clinic today for a follow-up visit.  The patient establish care in the clinic on 05/06/2023.  The patient is followed for iron deficiency anemia. The patient has been evaluated by gastroenterology and had EGD and colonoscopy which were negative for bleeding.  He also had a capsule endoscopy for which the patient states that it was negative for any etiology of heme positive stool.   Therefore, he takes an iron supplement BID p.o. daily and is compliant.  He tolerates this well.  He also receives IV iron as needed the most recent being with Feraheme on 05/21/2023.  He tolerated this well.    He sees Dr. 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   He sees cardiology for the atrial fibrillation clinic tomorrow. He is on eliquis . He has been monitoring his BP closely. Sometimes he states he feels a little off. He was put on prednisone  for an orthopedic concern recently which caused him to feel jittery.   Overall the patient is feeling well. Denies any dyspnea on exertion.  Denies any syncope or visible bleeding.  He is here today for evaluation and repeat blood work.  MEDICAL HISTORY: Past Medical History:  Diagnosis Date   Allergic rhinitis    Aortic atherosclerosis    Arthritis    Carotid artery stenosis    1-39% bilateral dopplers 11/2023   Coronary artery disease 04/13/2008   Drug eluting stent in RCA // Myoview  04/2019: EF 52, normal perfusion; low risk   Hypercholesteremia    Hypertension    PAF (paroxysmal atrial fibrillation) (HCC)    RBBB    Renal disorder    Tobacco abuse     ALLERGIES:  is allergic to bee venom,  amoxicillin-pot clavulanate, hydromorphone , and tramadol .  MEDICATIONS:  Current Outpatient Medications  Medication Sig Dispense Refill   acetaminophen  (TYLENOL ) 500 MG tablet Take 1,000 mg by mouth every 6 (six) hours as needed.     albuterol  (VENTOLIN  HFA) 108 (90 Base) MCG/ACT inhaler Inhale 2 puffs into the lungs every 4 (four) hours as needed for shortness of breath.     amLODipine  (NORVASC ) 5 MG tablet Take 1 tablet (5 mg total) by mouth daily. 90 tablet 0   atorvastatin  (LIPITOR) 20 MG tablet TAKE 1 TABLET(20 MG) BY MOUTH DAILY 90 tablet 2   Coenzyme Q10 (CO Q-10) 100 MG CAPS Take 1 capsule by mouth every morning.     cyanocobalamin  (VITAMIN B12) 1000 MCG tablet Take 1,000 mcg by mouth daily.     dronedarone  (MULTAQ ) 400 MG tablet Take 1 tablet (400 mg total) by mouth 2 (two) times daily with a meal. 60 tablet 6   ELIQUIS  5 MG TABS tablet TAKE 1 TABLET(5 MG) BY MOUTH TWICE DAILY 180 tablet 1   EPINEPHrine 0.3 mg/0.3 mL IJ SOAJ injection Inject 0.3 mg into the muscle as needed for anaphylaxis.     ferrous sulfate  325 (65 FE) MG tablet Take 1 tablet (325 mg total) by mouth 2 (two) times daily with a meal. 60 tablet 11   Fluticasone -Umeclidin-Vilant 100-62.5-25 MCG/ACT AEPB Inhale 1 puff into the lungs at bedtime. Not sure the dosage  metoprolol  tartrate (LOPRESSOR ) 25 MG tablet Take 0.5 tablets (12.5 mg total) by mouth 2 (two) times daily. 90 tablet 2   oxyCODONE  (ROXICODONE ) 5 MG immediate release tablet Take 1 tablet (5 mg total) by mouth every 4 (four) hours as needed for severe pain (pain score 7-10) or breakthrough pain. 20 tablet 0   Current Facility-Administered Medications  Medication Dose Route Frequency Provider Last Rate Last Admin   triamcinolone  acetonide (KENALOG ) 10 MG/ML injection 10 mg  10 mg Other Once Burt Fus, DPM        SURGICAL HISTORY:  Past Surgical History:  Procedure Laterality Date   CAROTID STENT  2010   COLONOSCOPY N/A 03/28/2023   Procedure:  COLONOSCOPY;  Surgeon: Kriss Estefana DEL, DO;  Location: [facility] ENDOSCOPY;  Service: Gastroenterology;  Laterality: N/A;   ESOPHAGOGASTRODUODENOSCOPY N/A 03/28/2023   Procedure: ESOPHAGOGASTRODUODENOSCOPY (EGD);  Surgeon: Kriss Estefana DEL, DO;  Location: [facility] ENDOSCOPY;  Service: Gastroenterology;  Laterality: N/A;   SHOULDER ACROMIOPLASTY  2010    REVIEW OF SYSTEMS:   Review of Systems  Constitutional: Negative for appetite change, chills, fever and unexpected weight change.  HENT: Negative for mouth sores, nosebleeds, sore throat and trouble swallowing.   Eyes: Negative for eye problems and icterus.  Respiratory: Negative for cough, hemoptysis, shortness of breath and wheezing.   Cardiovascular: Negative for chest pain and leg swelling.  Gastrointestinal: Negative for abdominal pain, constipation, diarrhea, nausea and vomiting.  Genitourinary: Negative for bladder incontinence, difficulty urinating, dysuria, frequency and hematuria.   Musculoskeletal: Negative for back pain, gait problem, neck pain and neck stiffness.  Skin: Bandage on left hand from recent melanoma excision. Negative for itching and rash.  Neurological: Negative for dizziness, extremity weakness, gait problem, headaches, light-headedness and seizures.  Hematological: Negative for adenopathy. Positive for easy bruising.  Psychiatric/Behavioral: Negative for confusion, depression and sleep disturbance. The patient is not nervous/anxious.       PHYSICAL EXAMINATION:  Blood pressure 129/70, pulse 61, temperature 97.7 F (36.5 C), temperature source Temporal, resp. rate 13, weight 175 lb 8 oz (79.6 kg), SpO2 100%.  ECOG PERFORMANCE STATUS: 0  Physical Exam  Constitutional: Oriented to person, place, and time and well-developed, well-nourished, and in no distress.  HENT:  Head: Normocephalic and atraumatic.  Mouth/Throat: Oropharynx is clear and moist. No oropharyngeal exudate.  Eyes: Conjunctivae are normal. Right eye  exhibits no discharge. Left eye exhibits no discharge. No scleral icterus.  Neck: Normal range of motion. Neck supple.  Cardiovascular: Normal rate, regular rhythm, normal heart sounds and intact distal pulses.   Pulmonary/Chest: Effort normal and breath sounds normal. No respiratory distress. No wheezes. No rales.  Abdominal: Soft. Bowel sounds are normal. Exhibits no distension and no mass. There is no tenderness.  Musculoskeletal: Normal range of motion. Exhibits no edema.  Lymphadenopathy:    No cervical adenopathy.  Neurological: Alert and oriented to person, place, and time. Exhibits normal muscle tone. Gait normal. Coordination normal.  Skin: Bandage on left hand from recent melanoma excision. Skin is warm and dry. No rash noted. Not diaphoretic. No erythema. No pallor.  Psychiatric: Mood, memory and judgment normal.  Vitals reviewed.  LABORATORY DATA: Lab Results  Component Value Date   WBC 9.7 02/10/2024   HGB 15.7 02/10/2024   HCT 45.4 02/10/2024   MCV 94.2 02/10/2024   PLT 192 02/10/2024      Chemistry      Component Value Date/Time   NA 137 05/06/2023 1324   NA 140 03/20/2020 0831  K 3.9 05/06/2023 1324   CL 103 05/06/2023 1324   CO2 28 05/06/2023 1324   BUN 14 05/06/2023 1324   BUN 13 03/20/2020 0831   CREATININE 1.06 05/06/2023 1324      Component Value Date/Time   CALCIUM  9.0 05/06/2023 1324   ALKPHOS 87 05/06/2023 1324   AST 18 05/06/2023 1324   ALT 15 05/06/2023 1324   BILITOT 0.7 05/06/2023 1324       RADIOGRAPHIC STUDIES:  XR HIP UNILAT W OR W/O PELVIS 1V LEFT Result Date: 01/27/2024 Radiographs of the left hip demonstrate some cystic changes especially in the superior acetabulum and sclerotic changes no acute fractures noted findings consistent with mild to moderate arthritis  XR KNEE 3 VIEW RIGHT Result Date: 01/13/2024 Three-view radiographs of the right knee demonstrate degenerative changes with periarticular osteophytes and sclerotic  changes..  Findings also consistent with CPPD    ASSESSMENT/PLAN:  This a very pleasant 78 year old male referred to the clinic for anemia.  The patient presented in December 2024.   He had a thorough evaluation performed by gastroenterology including colonoscopy, EGD, and capsule endoscopy which were negative for bleeding.   The patient receives IV iron as needed with Feraheme.  The most recent dose was on 05/21/23.    He also takes an iron supplement with p.o. daily.   The patient had repeat labs performed today.  His labs show low hemoglobin. His iron studies are pending. His ferritin is pending at this time.   It is unlikely that the patient will require any IV iron at this time.  He was instructed to continue taking his iron supplements.   We will see him back for labs and follow-up in 6 months.  Of course, if he ever has worsening signs and symptoms of anemia he knows he can call us  sooner.  He will see his cardiologist tomorrow regarding his atrial fibrillation. When he is feeling off, I asked that he check his pulse and blood pressure and make sure he is hydrating well and eating.   The patient was advised to call immediately if he has any concerning symptoms in the interval. The patient voices understanding of current disease status and treatment options and is in agreement with the current care plan. All questions were answered. The patient knows to call the clinic with any problems, questions or concerns. We can certainly see the patient much sooner if necessary    Orders Placed This Encounter  Procedures   CBC with Differential (Cancer Center Only)    Standing Status:   Future    Expected Date:   08/09/2024    Expiration Date:   02/09/2025   Ferritin    Standing Status:   Future    Expected Date:   08/09/2024    Expiration Date:   02/09/2025   Iron and Iron Binding Capacity ([facility] only)    Standing Status:   Future    Expected Date:   08/09/2024    Expiration Date:    02/09/2025     The total time spent in the appointment was 20-29 minutes  Maimouna Rondeau L Adar Rase, PA-C 02/10/24

## 2024-02-07 DIAGNOSIS — I2510 Atherosclerotic heart disease of native coronary artery without angina pectoris: Secondary | ICD-10-CM | POA: Diagnosis not present

## 2024-02-07 DIAGNOSIS — J449 Chronic obstructive pulmonary disease, unspecified: Secondary | ICD-10-CM | POA: Diagnosis not present

## 2024-02-07 DIAGNOSIS — J441 Chronic obstructive pulmonary disease with (acute) exacerbation: Secondary | ICD-10-CM | POA: Diagnosis not present

## 2024-02-07 DIAGNOSIS — I480 Paroxysmal atrial fibrillation: Secondary | ICD-10-CM | POA: Diagnosis not present

## 2024-02-10 ENCOUNTER — Inpatient Hospital Stay: Attending: Physician Assistant

## 2024-02-10 ENCOUNTER — Inpatient Hospital Stay (HOSPITAL_BASED_OUTPATIENT_CLINIC_OR_DEPARTMENT_OTHER): Admitting: Physician Assistant

## 2024-02-10 ENCOUNTER — Telehealth: Payer: Self-pay | Admitting: Internal Medicine

## 2024-02-10 VITALS — BP 129/70 | HR 61 | Temp 97.7°F | Resp 13 | Wt 175.5 lb

## 2024-02-10 DIAGNOSIS — D509 Iron deficiency anemia, unspecified: Secondary | ICD-10-CM

## 2024-02-10 DIAGNOSIS — D62 Acute posthemorrhagic anemia: Secondary | ICD-10-CM

## 2024-02-10 LAB — CBC WITH DIFFERENTIAL (CANCER CENTER ONLY)
Abs Immature Granulocytes: 0.06 K/uL (ref 0.00–0.07)
Basophils Absolute: 0.1 K/uL (ref 0.0–0.1)
Basophils Relative: 1 %
Eosinophils Absolute: 0.3 K/uL (ref 0.0–0.5)
Eosinophils Relative: 3 %
HCT: 45.4 % (ref 39.0–52.0)
Hemoglobin: 15.7 g/dL (ref 13.0–17.0)
Immature Granulocytes: 1 %
Lymphocytes Relative: 13 %
Lymphs Abs: 1.2 K/uL (ref 0.7–4.0)
MCH: 32.6 pg (ref 26.0–34.0)
MCHC: 34.6 g/dL (ref 30.0–36.0)
MCV: 94.2 fL (ref 80.0–100.0)
Monocytes Absolute: 0.8 K/uL (ref 0.1–1.0)
Monocytes Relative: 8 %
Neutro Abs: 7.3 K/uL (ref 1.7–7.7)
Neutrophils Relative %: 74 %
Platelet Count: 192 K/uL (ref 150–400)
RBC: 4.82 MIL/uL (ref 4.22–5.81)
RDW: 13.9 % (ref 11.5–15.5)
WBC Count: 9.7 K/uL (ref 4.0–10.5)
nRBC: 0 % (ref 0.0–0.2)

## 2024-02-10 LAB — IRON AND IRON BINDING CAPACITY (CC-WL,HP ONLY)
Iron: 81 ug/dL (ref 45–182)
Saturation Ratios: 22 % (ref 17.9–39.5)
TIBC: 372 ug/dL (ref 250–450)
UIBC: 291 ug/dL (ref 117–376)

## 2024-02-10 LAB — FERRITIN: Ferritin: 141 ng/mL (ref 24–336)

## 2024-02-10 NOTE — Telephone Encounter (Signed)
 Scheduled patient for next appointment. Called and spoke with the patient, he is aware.

## 2024-02-11 ENCOUNTER — Ambulatory Visit (HOSPITAL_COMMUNITY)
Admission: RE | Admit: 2024-02-11 | Discharge: 2024-02-11 | Disposition: A | Discharge status: Home / Self Care | Source: Ambulatory Visit | Attending: Physician Assistant | Admitting: Physician Assistant

## 2024-02-11 VITALS — BP 120/58 | HR 55 | Ht 69.0 in | Wt 174.2 lb

## 2024-02-11 DIAGNOSIS — I4891 Unspecified atrial fibrillation: Secondary | ICD-10-CM | POA: Diagnosis not present

## 2024-02-11 DIAGNOSIS — Z5181 Encounter for therapeutic drug level monitoring: Secondary | ICD-10-CM | POA: Insufficient documentation

## 2024-02-11 DIAGNOSIS — Z79899 Other long term (current) drug therapy: Secondary | ICD-10-CM | POA: Diagnosis not present

## 2024-02-11 DIAGNOSIS — I480 Paroxysmal atrial fibrillation: Secondary | ICD-10-CM | POA: Insufficient documentation

## 2024-02-11 DIAGNOSIS — D6869 Other thrombophilia: Secondary | ICD-10-CM | POA: Diagnosis not present

## 2024-02-11 MED ORDER — MULTAQ 400 MG PO TABS: 400.0000 mg | ORAL_TABLET | Freq: Two times a day (BID) | ORAL | 9 refills | Status: AC

## 2024-02-11 MED ORDER — METOPROLOL TARTRATE 25 MG PO TABS: 12.5000 mg | ORAL_TABLET | Freq: Two times a day (BID) | ORAL | 2 refills | Status: DC

## 2024-02-11 NOTE — Patient Instructions (Signed)
 Follow up with Dr Shlomo - will have office reach out

## 2024-02-11 NOTE — Progress Notes (Signed)
 Primary Care Physician: Chrystal Lamarr RAMAN, MD Referring Physician: Dr. Shlomo Agent Khiry Julian Weaver is a 78 y.o. male with a h/o  CAD, HTN, anemia, Carotid disease, HLD who presents to the [facility] for follow up. Patient is on Eliquis  for stroke prevention. He has been maintained on Multaq  for rhythm control. Patient wore a 2 week Zio monitor for a syncopal episode that occurred in December. The monitor showed 16% afib burden. He was scheduled for dofetilide loading but cancelled while his anemia was being treated. The patient has been evaluated by gastroenterology and had EGD and colonoscopy which were negative for bleeding. He also had a capsule endoscopy for which the patient states that it was negative for any etiology of heme positive stool.   Patient returns for follow up for atrial fibrillation and Multaq  monitoring. He is in SR today and feels well. He did have an episode of afib while he was on prednisone  for a musculoskeletal injury but otherwise had been maintaining SR. No recent bleeding issues on anticoagulation.   Today, he  denies symptoms of palpitations, chest pain, shortness of breath, orthopnea, PND, lower extremity edema, dizziness, presyncope, syncope, snoring, daytime somnolence, bleeding, or neurologic sequela. The patient is tolerating medications without difficulties and is otherwise without complaint today.    Past Medical History:  Diagnosis Date   Allergic rhinitis    Aortic atherosclerosis    Arthritis    Carotid artery stenosis    1-39% bilateral dopplers 11/2023   Coronary artery disease 04/13/2008   Drug eluting stent in RCA // Myoview  04/2019: EF 52, normal perfusion; low risk   Hypercholesteremia    Hypertension    PAF (paroxysmal atrial fibrillation) (HCC)    RBBB    Renal disorder    Tobacco abuse    Current Outpatient Medications  Medication Sig Dispense Refill   acetaminophen  (TYLENOL ) 500 MG tablet Take 1,000  mg by mouth every 6 (six) hours as needed. (Patient taking differently: Take 1,000 mg by mouth as needed.)     albuterol  (VENTOLIN  HFA) 108 (90 Base) MCG/ACT inhaler Inhale 2 puffs into the lungs every 4 (four) hours as needed for shortness of breath.     amLODipine  (NORVASC ) 5 MG tablet Take 1 tablet (5 mg total) by mouth daily. 90 tablet 0   atorvastatin  (LIPITOR) 20 MG tablet TAKE 1 TABLET(20 MG) BY MOUTH DAILY 90 tablet 2   Coenzyme Q10 (CO Q-10) 100 MG CAPS Take 1 capsule by mouth every morning.     cyanocobalamin  (VITAMIN B12) 1000 MCG tablet Take 1,000 mcg by mouth daily.     dronedarone  (MULTAQ ) 400 MG tablet Take 1 tablet (400 mg total) by mouth 2 (two) times daily with a meal. 60 tablet 6   ELIQUIS  5 MG TABS tablet TAKE 1 TABLET(5 MG) BY MOUTH TWICE DAILY 180 tablet 1   EPINEPHrine 0.3 mg/0.3 mL IJ SOAJ injection Inject 0.3 mg into the muscle as needed for anaphylaxis.     ferrous sulfate  325 (65 FE) MG tablet Take 1 tablet (325 mg total) by mouth 2 (two) times daily with a meal. 60 tablet 11   Fluticasone -Umeclidin-Vilant 100-62.5-25 MCG/ACT AEPB Inhale 1 puff into the lungs at bedtime. Not sure the dosage     metoprolol  tartrate (LOPRESSOR ) 25 MG tablet Take 0.5 tablets (12.5 mg total) by mouth 2 (two) times daily. 90 tablet 2   oxyCODONE  (ROXICODONE ) 5 MG immediate release tablet Take 1 tablet (5 mg total)  by mouth every 4 (four) hours as needed for severe pain (pain score 7-10) or breakthrough pain. (Patient taking differently: Take 5 mg by mouth as needed for severe pain (pain score 7-10) or breakthrough pain.) 20 tablet 0   Current Facility-Administered Medications  Medication Dose Route Frequency Provider Last Rate Last Admin   triamcinolone  acetonide (KENALOG ) 10 MG/ML injection 10 mg  10 mg Other Once Stover, Titorya, DPM        ROS- All systems are reviewed and negative except as per the HPI above  Physical Exam: Vitals:   02/11/24 1124  Weight: 79 kg  Height: 5' 9  (1.753 m)    Wt Readings from Last 3 Encounters:  02/11/24 79 kg  02/10/24 79.6 kg  10/17/23 79.2 kg    GEN: Well nourished, well developed in no acute distress CARDIAC: Regular rate and rhythm, no murmurs, rubs, gallops RESPIRATORY:  Clear to auscultation without rales, wheezing or rhonchi  ABDOMEN: Soft, non-tender, non-distended EXTREMITIES:  No edema; No deformity     EKG Interpretation Date/Time:  Wednesday February 11 2024 11:32:50 EST Ventricular Rate:  55 PR Interval:  138 QRS Duration:  138 QT Interval:  462 QTC Calculation: 441 R Axis:   90  Text Interpretation: Sinus bradycardia with sinus arrhythmia Right bundle branch block Abnormal ECG When compared with ECG of 11-Aug-2023 11:27, No significant change was found Confirmed by Greenly Rarick ([id number]) on 02/11/2024 11:35:16 AM    Echo 03/21/23  1. Left ventricular ejection fraction, by estimation, is 60 to 65%. The  left ventricle has normal function. The left ventricle has no regional  wall motion abnormalities. Left ventricular diastolic parameters are  indeterminate.   2. Right ventricular systolic function is normal. The right ventricular  size is normal.   3. The mitral valve is normal in structure. Trivial mitral valve  regurgitation. No evidence of mitral stenosis.   4. The aortic valve is tricuspid. Aortic valve regurgitation is trivial.  No aortic stenosis is present.   5. The inferior vena cava is normal in size with greater than 50%  respiratory variability, suggesting right atrial pressure of 3 mmHg.   Comparison(s): No significant change from prior study. Prior images  reviewed side by side.    CHA2DS2-VASc Score = 4  The patient's score is based upon: CHF History: 0 HTN History: 1 Diabetes History: 0 Stroke History: 0 Vascular Disease History: 1 Age Score: 2 Gender Score: 0       ASSESSMENT AND PLAN: Paroxysmal Atrial Fibrillation (ICD10:  I48.0) The patient's CHA2DS2-VASc score is 4,  indicating a 4.8% annual risk of stroke.   Patient appears to be maintaining SR Continue Multaq  400 mg BID Continue Eliquis  5 mg BID Continue Lopressor  12.5 mg BID  Secondary Hypercoagulable State (ICD10:  D68.69) The patient is at significant risk for stroke/thromboembolism based upon his CHA2DS2-VASc Score of 4.  Continue Apixaban  (Eliquis ). No recent bleeding issues but he does have a h/o GI bleed requiring transfusion. We discussed Watchman today, brochure provided. He would like to talk to Dr Shlomo before making a decision.   High Risk Medication Monitoring (ICD 10: [id number]) Patient requires ongoing monitoring for anti-arrhythmic medication which has the potential to cause life threatening arrhythmias. Intervals on ECG acceptable for dronedarone  monitoring.      CAD DES 2010 No anginal symptoms Followed by Dr Shlomo  HTN Stable on current regimen   Follow up in the AF clinic in 6 months. He is also due for a  visit with Dr Shlomo.    Daril Kicks PA-C [facility] [address] [place], [place] [address] [phone]

## 2024-02-21 ENCOUNTER — Other Ambulatory Visit: Payer: Self-pay | Admitting: Cardiology

## 2024-02-22 DIAGNOSIS — E7800 Pure hypercholesterolemia, unspecified: Secondary | ICD-10-CM | POA: Diagnosis not present

## 2024-02-22 DIAGNOSIS — M1991 Primary osteoarthritis, unspecified site: Secondary | ICD-10-CM | POA: Diagnosis not present

## 2024-02-22 DIAGNOSIS — J441 Chronic obstructive pulmonary disease with (acute) exacerbation: Secondary | ICD-10-CM | POA: Diagnosis not present

## 2024-02-22 DIAGNOSIS — J449 Chronic obstructive pulmonary disease, unspecified: Secondary | ICD-10-CM | POA: Diagnosis not present

## 2024-02-22 DIAGNOSIS — I480 Paroxysmal atrial fibrillation: Secondary | ICD-10-CM | POA: Diagnosis not present

## 2024-02-22 DIAGNOSIS — I2510 Atherosclerotic heart disease of native coronary artery without angina pectoris: Secondary | ICD-10-CM | POA: Diagnosis not present

## 2024-02-23 ENCOUNTER — Other Ambulatory Visit: Payer: Self-pay | Admitting: Cardiology

## 2024-02-24 ENCOUNTER — Telehealth: Payer: Self-pay | Admitting: Cardiology

## 2024-02-24 NOTE — Telephone Encounter (Signed)
*  STAT* If patient is at the pharmacy, call can be transferred to refill team.   1. Which medications need to be refilled? (please list name of each medication and dose if known) atorvastatin  (LIPITOR) 20 MG tablet  ELIQUIS  5 MG TABS tablet  amLODipine  (NORVASC ) 5 MG tablet    2. Would you like to learn more about the convenience, safety, & potential cost savings by using the [facility]?    3. Are you open to using the [facility] (Type [facility]. ).   4. Which pharmacy/location (including street and city if local pharmacy) is medication to be sent to? [id number] - [place], [place] - [address] AT [facility] OF [address]    5. Do they need a 30 day or 90 day supply? 90 day

## 2024-02-25 NOTE — Telephone Encounter (Signed)
 Refills have already been taken care of.

## 2024-03-08 DIAGNOSIS — Z0000 Encounter for general adult medical examination without abnormal findings: Secondary | ICD-10-CM | POA: Diagnosis not present

## 2024-03-08 DIAGNOSIS — E7800 Pure hypercholesterolemia, unspecified: Secondary | ICD-10-CM | POA: Diagnosis not present

## 2024-03-08 DIAGNOSIS — J449 Chronic obstructive pulmonary disease, unspecified: Secondary | ICD-10-CM | POA: Diagnosis not present

## 2024-03-08 DIAGNOSIS — M1991 Primary osteoarthritis, unspecified site: Secondary | ICD-10-CM | POA: Diagnosis not present

## 2024-03-08 DIAGNOSIS — F17210 Nicotine dependence, cigarettes, uncomplicated: Secondary | ICD-10-CM | POA: Diagnosis not present

## 2024-03-08 DIAGNOSIS — K219 Gastro-esophageal reflux disease without esophagitis: Secondary | ICD-10-CM | POA: Diagnosis not present

## 2024-03-08 DIAGNOSIS — Z91030 Bee allergy status: Secondary | ICD-10-CM | POA: Diagnosis not present

## 2024-03-08 DIAGNOSIS — I10 Essential (primary) hypertension: Secondary | ICD-10-CM | POA: Diagnosis not present

## 2024-03-08 DIAGNOSIS — D6869 Other thrombophilia: Secondary | ICD-10-CM | POA: Diagnosis not present

## 2024-03-08 DIAGNOSIS — R7303 Prediabetes: Secondary | ICD-10-CM | POA: Diagnosis not present

## 2024-03-08 DIAGNOSIS — I480 Paroxysmal atrial fibrillation: Secondary | ICD-10-CM | POA: Diagnosis not present

## 2024-03-11 DIAGNOSIS — I10 Essential (primary) hypertension: Secondary | ICD-10-CM | POA: Diagnosis not present

## 2024-03-11 DIAGNOSIS — R7303 Prediabetes: Secondary | ICD-10-CM | POA: Diagnosis not present

## 2024-03-11 DIAGNOSIS — E7800 Pure hypercholesterolemia, unspecified: Secondary | ICD-10-CM | POA: Diagnosis not present

## 2024-03-23 ENCOUNTER — Other Ambulatory Visit: Payer: Self-pay | Admitting: Cardiology

## 2024-03-23 ENCOUNTER — Inpatient Hospital Stay
Admission: RE | Admit: 2024-03-23 | Discharge: 2024-03-23 | Disposition: A | Source: Ambulatory Visit | Attending: Acute Care | Admitting: Acute Care

## 2024-03-23 DIAGNOSIS — F17210 Nicotine dependence, cigarettes, uncomplicated: Secondary | ICD-10-CM

## 2024-03-23 DIAGNOSIS — Z122 Encounter for screening for malignant neoplasm of respiratory organs: Secondary | ICD-10-CM

## 2024-03-23 DIAGNOSIS — Z87891 Personal history of nicotine dependence: Secondary | ICD-10-CM

## 2024-03-24 NOTE — Telephone Encounter (Signed)
 Eliquis  5mg  refill request received. Patient is 79 years old, weight-79kg, Crea-1.06 on 05/06/23, Diagnosis-Afib, and last seen by Quita Kicks on 02/11/24. Dose is appropriate based on dosing criteria. Will send in refill to requested pharmacy.

## 2024-03-26 ENCOUNTER — Other Ambulatory Visit: Payer: Self-pay | Admitting: Acute Care

## 2024-03-26 DIAGNOSIS — Z122 Encounter for screening for malignant neoplasm of respiratory organs: Secondary | ICD-10-CM

## 2024-03-26 DIAGNOSIS — F17210 Nicotine dependence, cigarettes, uncomplicated: Secondary | ICD-10-CM

## 2024-03-26 DIAGNOSIS — Z87891 Personal history of nicotine dependence: Secondary | ICD-10-CM

## 2024-03-29 NOTE — Progress Notes (Signed)
 " Cardiology Office Note:    Date:  03/30/2024   ID:  Julian Weaver, DOB [birth year], MRN [id number]  PCP:  Chrystal Lamarr RAMAN, MD   [facility] Providers Cardiologist:  Wilbert Bihari, MD     Referring MD: Chrystal Lamarr RAMAN, MD   Chief complaint: 6 month follow up     History of Present Illness:   Julian Weaver is a 79 y.o. male with a hx of CAD, HTN, anemia, carotid disease, HLD, PAF presenting to clinic for follow-up of chronic cardiac conditions.  Remote PCI of the RCA in 2010.  Bilateral carotid bruits observed in 2017, with subsequent carotid Doppler revealing 1-39% bilateral ICA stenosis.  2D echo ordered in 2018 following episodes of dizziness, revealing LVEF 60-65%, mild concentric hypertrophy, normal systolic function, no RWMA, normal diastolic parameters, no significant valvular abnormality.  Reported dizziness and occasional near syncope for a month when walking in 2020, cardiac event monitor at that time revealed underlying rhythm of sinus rhythm, HR 48-1 93, average 72.  Frequent episodes of PAF with RVR up to 193 bpm, wide-complex tachycardia up to 5 beats.  Patient started on Eliquis  5 mg twice daily and Lopressor .  There were complaints of numbness and tingling in his upper extremities in June 2022, carotid Dopplers showed disturbed flow of both subclavian arteries.  Upper extremity arterial duplex in June 2022 revealed patent arteries of right upper extremity.  He was referred to the A-fib clinic in August 2022 following complaints of further palpitations while on metoprolol , started on Multaq .  Patient reported syncope while sitting at dinner in 2024.  Subsequent echo revealing LVEF 60-65%, no RWMA, indeterminate diastolic parameters, normal RV, trivial AI.  Cardiac event monitor showing predominantly NSR, 1 episode of NSVT lasting 9 seconds at 222 bpm, 18 episodes of SVT lasting up to 13 beats.  PAF (16% burden) as high as 198 bpm with longest episode  lasting 11 hours and 49 minutes.  He completed a TTE on 03/21/2023 that showed EF 60-65%, no RWMA, trivial MVR.  Seen in the ED 03/25/2023 with complaint of dizziness and fatigue, found to have symptomatic anemia, treated with 2 units PRBC, positive FOBT, and patient underwent EGD and colonoscopy with internal hemorrhoids and colonic diverticulosis noted.  Patient establish care with hematology in February 2025 following this hospitalization.  Most recent general cardiology OV was February 2025 with Jackee Alberts, NP for preoperative clearance for shoulder surgery.  Aside from continued episodes of dizziness and near syncope that was found to be caused by persistent frequent episodes of atrial fibrillation while hospitalized in late December, patient denied other cardiovascular symptoms.  Patient was followed by the A-fib clinic throughout 2025, most recently in November.  Patient noted he had an episode of atrial fibrillation while on prednisone  for musculoskeletal injury, otherwise was remaining in NSR.  Presents independently, doing well from a cardiovascular point. He denies chest pain, palpitations, dyspnea, orthopnea, n, v,  dark/tarry/bloody stools, hematuria, dizziness, syncope, edema, weight gain.  States the last time he removed recognized he went into A-fib was around a month ago, lasted for about an hour, typically resolves if he relaxes and drinks cold water.  He feels as though his A-fib is well-controlled.  Reports his BPs are well-controlled.  Denies missing any doses of his Eliquis , denies issues with bleeding/bruising/rash.  Works in holiday representative, performs heavy lifting regularly, denies any recent change in stamina.  Is interested in learning more about the Watchman procedure and finding out  if he is a candidate, as he is interested in potentially stopping his Eliquis  at some point the future.   ROS:   Please see the history of present illness.    All other systems reviewed and are  negative.     Past Medical History:  Diagnosis Date   Allergic rhinitis    Aortic atherosclerosis    Arthritis    Carotid artery stenosis    1-39% bilateral dopplers 11/2023   Coronary artery disease 04/13/2008   Drug eluting stent in RCA // Myoview  04/2019: EF 52, normal perfusion; low risk   Hypercholesteremia    Hypertension    PAF (paroxysmal atrial fibrillation) (HCC)    RBBB    Renal disorder    Tobacco abuse     Past Surgical History:  Procedure Laterality Date   CAROTID STENT  2010   COLONOSCOPY N/A 03/28/2023   Procedure: COLONOSCOPY;  Surgeon: Kriss Estefana DEL, DO;  Location: [facility] ENDOSCOPY;  Service: Gastroenterology;  Laterality: N/A;   ESOPHAGOGASTRODUODENOSCOPY N/A 03/28/2023   Procedure: ESOPHAGOGASTRODUODENOSCOPY (EGD);  Surgeon: Kriss Estefana DEL, DO;  Location: [facility] ENDOSCOPY;  Service: Gastroenterology;  Laterality: N/A;   SHOULDER ACROMIOPLASTY  2010    Current Medications: Active Medications[1]   Allergies:   Bee venom, Amoxicillin-pot clavulanate, Hydromorphone , and Tramadol    Social History   Socioeconomic History   Marital status: Married    Spouse name: Not on file   Number of children: Not on file   Years of education: Not on file   Highest education level: Not on file  Occupational History   Not on file  Tobacco Use   Smoking status: Every Day    Current packs/day: 1.00    Average packs/day: 1 pack/day for 57.4 years (57.4 ttl pk-yrs)    Types: Cigarettes    Start date: 10/27/1982   Smokeless tobacco: Never   Tobacco comments:    Half-pack daily 11/09/2020  Substance and Sexual Activity   Alcohol use: Yes    Alcohol/week: 1.0 standard drink of alcohol    Types: 1 Cans of beer per week    Comment: Rarely- 3 times a month   Drug use: No   Sexual activity: Not on file  Other Topics Concern   Not on file  Social History Narrative   Not on file   Social Drivers of Health   Tobacco Use: High Risk (03/30/2024)   Patient History     Smoking Tobacco Use: Every Day    Smokeless Tobacco Use: Never    Passive Exposure: Not on file  Financial Resource Strain: Not on file  Food Insecurity: No Food Insecurity (03/26/2023)   Hunger Vital Sign    Worried About Running Out of Food in the Last Year: Never true    Ran Out of Food in the Last Year: Never true  Transportation Needs: No Transportation Needs (03/26/2023)   PRAPARE - Administrator, Civil Service (Medical): No    Lack of Transportation (Non-Medical): No  Physical Activity: Not on file  Stress: Not on file  Social Connections: Moderately Isolated (03/26/2023)   Social Connection and Isolation Panel    Frequency of Communication with Friends and Family: Twice a week    Frequency of Social Gatherings with Friends and Family: Twice a week    Attends Religious Services: Never    Database Administrator or Organizations: No    Attends Banker Meetings: Never    Marital Status: Married  Depression (PHQ2-9): Low  Risk (02/10/2024)   Depression (PHQ2-9)    PHQ-2 Score: 0  Alcohol Screen: Not on file  Housing: Low Risk (03/26/2023)   Housing Stability Vital Sign    Unable to Pay for Housing in the Last Year: No    Number of Times Moved in the Last Year: 1    Homeless in the Last Year: No  Utilities: Not At Risk (03/26/2023)   AHC Utilities    Threatened with loss of utilities: No  Health Literacy: Not on file     Family History: The patient's family history includes Stroke in his father and mother.  EKGs/Labs/Other Studies Reviewed:    The following studies were reviewed today:  EKG Interpretation Date/Time:  Tuesday March 30 2024 07:59:21 EST Ventricular Rate:  54 PR Interval:  146 QRS Duration:  136 QT Interval:  468 QTC Calculation: 443 R Axis:   67  Text Interpretation: Sinus bradycardia Right bundle branch block When compared with ECG of 11-Feb-2024 11:32, No significant change was found Confirmed by Andre Gallego [phone]) on  03/30/2024 8:06:53 AM    Recent Labs: 05/06/2023: ALT 15; BUN 14; Creatinine 1.06; Potassium 3.9; Sodium 137 02/10/2024: Hemoglobin 15.7; Platelet Count 192  Recent Lipid Panel    Component Value Date/Time   CHOL 88 (L) 01/24/2023 0841   TRIG 70 01/24/2023 0841   HDL 41 01/24/2023 0841   CHOLHDL 2.1 01/24/2023 0841   CHOLHDL 2.5 05/24/2015 0816   VLDL 16 05/24/2015 0816   LDLCALC 32 01/24/2023 0841     Risk Assessment/Calculations:    CHA2DS2-VASc Score = 4   This indicates a 4.8% annual risk of stroke. The patient's score is based upon: CHF History: 0 HTN History: 1 Diabetes History: 0 Stroke History: 0 Vascular Disease History: 1 Age Score: 2 Gender Score: 0               Physical Exam:    VS:  BP (!) 118/58   Pulse (!) 54   Ht 5' 9 (1.753 m)   Wt 178 lb 9.6 oz (81 kg)   SpO2 97%   BMI 26.37 kg/m        Wt Readings from Last 3 Encounters:  03/30/24 178 lb 9.6 oz (81 kg)  02/11/24 174 lb 3.2 oz (79 kg)  02/10/24 175 lb 8 oz (79.6 kg)     GEN:  Well nourished, well developed in no acute distress HEENT: Normal NECK: Carotid bruit auscultated on left CARDIAC:  S1-S2 normal, RRR, no murmurs, rubs, gallops RESPIRATORY:  Clear to auscultation without rales, wheezing or rhonchi  MUSCULOSKELETAL: Trace bilateral edema, patient reports at baseline; No deformity  SKIN: Warm and dry NEUROLOGIC:  Alert and oriented x 3 PSYCHIATRIC:  Normal affect       Assessment & Plan PAF (paroxysmal atrial fibrillation) (HCC) First observed on cardiac event monitor in 2020, palpitations well Lopressor  led to use of Multaq  by A-fib clinic, which patient is currently rate controlled on EKG: Sinus bradycardia, RBBB, 54 bpm, no significant change from prior studies Denies missing doses of Eliquis , denies issues with significant bleeding or bruising Continue Multaq  as prescribed by A-fib clinic,  Eliquis  5 mg twice daily, Lopressor  12.5 mg twice daily Patient is interested in  learning more about the Watchman procedure to potentially stop Eliquis  at some point the future. Given history of positive FOBT with symptomatic acute blood loss anemia, believe patient may be a good candidate for this. Will refer to EP for further workup of potential  Watchman procedure Coronary artery disease involving native coronary artery of native heart, unspecified whether angina present Remote PCI of RCA in 2010 Denies chest pain, shortness of breath, orthopnea, near-syncope, palpitations Not on ASA 2/2 Eliquis  use Continue atorvastatin  20 mg daily, continue Lopressor  12.5 mg twice daily Labs 08/20/2023: Creatinine 0.97, BUN 13, potassium 4.2, sodium 139 Will repeat CMP and CBC for further drug monitoring Continue monitoring for any new or concerning symptoms Anemia, unspecified type Secondary to acute blood loss and positive FOBT in late 2024 Not currently symptomatic today, most recent CBC July 2025, hemoglobin 15.2 Will recheck CBC today for drug monitoring given use of Eliquis  Primary hypertension BPs reported to be well-controlled at home Continue amlodipine  5 mg daily, continue Lopressor  25 mg daily Bilateral carotid artery stenosis 11/2023 carotid duplex: Bilateral 1-39% ICA stenosis, stable since 2017 Left-sided carotid bruit auscultated on exam today Continue with annual follow-up as directed by primary cardiologist Mixed hyperlipidemia LDL November 2024: 32 Repeat fasting lipids, will order labs to be performed at some point over the next week giving patient is not fasting this morning Continue atorvastatin  20 mg daily  Disposition: Follow-up with the EP for further discussion on Watchman procedure.  Follow-up with general cardiologist in 6 months or sooner if necessary.  Proceed to the ED with any new, worsening, concerning symptoms.            Medication Adjustments/Labs and Tests Ordered: Current medicines are reviewed at length with the patient today.  Concerns  regarding medicines are outlined above.  Orders Placed This Encounter  Procedures   Lipid Profile   CBC   Basic Metabolic Panel (BMET)   Ambulatory referral to Cardiac Electrophysiology   EKG 12-Lead   Meds ordered this encounter  Medications   amLODipine  (NORVASC ) 5 MG tablet    Sig: Take 1 tablet (5 mg total) by mouth daily.    Dispense:  90 tablet    Refill:  3   atorvastatin  (LIPITOR) 20 MG tablet    Sig: TAKE 1 TABLET(20 MG) BY MOUTH DAILY    Dispense:  90 tablet    Refill:  3   apixaban  (ELIQUIS ) 5 MG TABS tablet    Sig: Take 1 tablet (5 mg total) by mouth 2 (two) times daily.    Dispense:  180 tablet    Refill:  3   metoprolol  tartrate (LOPRESSOR ) 25 MG tablet    Sig: Take 0.5 tablets (12.5 mg total) by mouth 2 (two) times daily.    Dispense:  90 tablet    Refill:  3    Patient Instructions  Medication Instructions:  Your physician recommends that you continue on your current medications as directed. Please refer to the Current Medication list given to you today.  Refills: 90 day supply with 3 refills have been sent in for the following medications: Eliquis , Lipitor, Amlodipine , Lopressor .  *If you need a refill on your cardiac medications before your next appointment, please call your pharmacy*  Lab Work: This Week: Fasting lipids, CBC, BMET  If you have labs (blood work) drawn today and your tests are completely normal, you will receive your results only by: MyChart Message (if you have MyChart) OR A paper copy in the mail If you have any lab test that is abnormal or we need to change your treatment, we will call you to review the results.  Testing/Procedures: NONE  Follow-Up: At [facility], you and your health needs are our priority.  As part of our  continuing mission to provide you with exceptional heart care, our providers are all part of one team.  This team includes your primary Cardiologist (physician) and Advanced Practice Providers or APPs  (Physician Assistants and Nurse Practitioners) who all work together to provide you with the care you need, when you need it.  Your next appointment:   6 month(s)  Provider:   Wilbert Bihari, MD              Signed, Maguire Killmer E Kyrin Gratz, NP  03/30/2024 8:33 AM    [facility]     [1]  Current Meds  Medication Sig   acetaminophen  (TYLENOL ) 500 MG tablet Take 1,000 mg by mouth every 6 (six) hours as needed. (Patient taking differently: Take 1,000 mg by mouth as needed.)   albuterol  (VENTOLIN  HFA) 108 (90 Base) MCG/ACT inhaler Inhale 2 puffs into the lungs every 4 (four) hours as needed for shortness of breath.   Coenzyme Q10 (CO Q-10) 100 MG CAPS Take 1 capsule by mouth every morning.   cyanocobalamin  (VITAMIN B12) 1000 MCG tablet Take 1,000 mcg by mouth daily.   dronedarone  (MULTAQ ) 400 MG tablet Take 1 tablet (400 mg total) by mouth 2 (two) times daily with a meal.   EPINEPHrine 0.3 mg/0.3 mL IJ SOAJ injection Inject 0.3 mg into the muscle as needed for anaphylaxis.   ferrous sulfate  325 (65 FE) MG tablet Take 1 tablet (325 mg total) by mouth 2 (two) times daily with a meal.   Fluticasone -Umeclidin-Vilant 100-62.5-25 MCG/ACT AEPB Inhale 1 puff into the lungs at bedtime. Not sure the dosage   [DISCONTINUED] amLODipine  (NORVASC ) 5 MG tablet TAKE 1 TABLET(5 MG) BY MOUTH DAILY   [DISCONTINUED] atorvastatin  (LIPITOR) 20 MG tablet TAKE 1 TABLET(20 MG) BY MOUTH DAILY   [DISCONTINUED] ELIQUIS  5 MG TABS tablet TAKE 1 TABLET(5 MG) BY MOUTH TWICE DAILY   [DISCONTINUED] metoprolol  tartrate (LOPRESSOR ) 25 MG tablet Take 0.5 tablets (12.5 mg total) by mouth 2 (two) times daily.   Current Facility-Administered Medications for the 03/30/24 encounter (Office Visit) with Moana Munford E, NP  Medication   triamcinolone  acetonide (KENALOG ) 10 MG/ML injection 10 mg   "

## 2024-03-30 ENCOUNTER — Ambulatory Visit: Attending: Emergency Medicine | Admitting: Emergency Medicine

## 2024-03-30 ENCOUNTER — Encounter: Payer: Self-pay | Admitting: Emergency Medicine

## 2024-03-30 ENCOUNTER — Telehealth: Payer: Self-pay

## 2024-03-30 VITALS — BP 118/58 | HR 54 | Ht 69.0 in | Wt 178.6 lb

## 2024-03-30 DIAGNOSIS — E782 Mixed hyperlipidemia: Secondary | ICD-10-CM | POA: Insufficient documentation

## 2024-03-30 DIAGNOSIS — I6523 Occlusion and stenosis of bilateral carotid arteries: Secondary | ICD-10-CM | POA: Diagnosis not present

## 2024-03-30 DIAGNOSIS — I480 Paroxysmal atrial fibrillation: Secondary | ICD-10-CM | POA: Diagnosis not present

## 2024-03-30 DIAGNOSIS — D649 Anemia, unspecified: Secondary | ICD-10-CM | POA: Diagnosis not present

## 2024-03-30 DIAGNOSIS — I10 Essential (primary) hypertension: Secondary | ICD-10-CM | POA: Diagnosis not present

## 2024-03-30 DIAGNOSIS — I2510 Atherosclerotic heart disease of native coronary artery without angina pectoris: Secondary | ICD-10-CM | POA: Insufficient documentation

## 2024-03-30 MED ORDER — APIXABAN 5 MG PO TABS: 5.0000 mg | ORAL_TABLET | Freq: Two times a day (BID) | ORAL | 3 refills | Status: AC

## 2024-03-30 MED ORDER — METOPROLOL TARTRATE 25 MG PO TABS: 12.5000 mg | ORAL_TABLET | Freq: Two times a day (BID) | ORAL | 3 refills | Status: AC

## 2024-03-30 MED ORDER — ATORVASTATIN CALCIUM 20 MG PO TABS: ORAL_TABLET | ORAL | 3 refills | Status: AC

## 2024-03-30 MED ORDER — AMLODIPINE BESYLATE 5 MG PO TABS: 5.0000 mg | ORAL_TABLET | Freq: Every day | ORAL | 3 refills | Status: AC

## 2024-03-30 NOTE — Assessment & Plan Note (Signed)
 Secondary to acute blood loss and positive FOBT in late 2024 Not currently symptomatic today, most recent CBC July 2025, hemoglobin 15.2 Will recheck CBC today for drug monitoring given use of Eliquis 

## 2024-03-30 NOTE — Patient Instructions (Signed)
 Medication Instructions:  Your physician recommends that you continue on your current medications as directed. Please refer to the Current Medication list given to you today.  Refills: 90 day supply with 3 refills have been sent in for the following medications: Eliquis , Lipitor, Amlodipine , Lopressor .  *If you need a refill on your cardiac medications before your next appointment, please call your pharmacy*  Lab Work: This Week: Fasting lipids, CBC, BMET  If you have labs (blood work) drawn today and your tests are completely normal, you will receive your results only by: MyChart Message (if you have MyChart) OR A paper copy in the mail If you have any lab test that is abnormal or we need to change your treatment, we will call you to review the results.  Testing/Procedures: NONE  Follow-Up: At [facility], you and your health needs are our priority.  As part of our continuing mission to provide you with exceptional heart care, our providers are all part of one team.  This team includes your primary Cardiologist (physician) and Advanced Practice Providers or APPs (Physician Assistants and Nurse Practitioners) who all work together to provide you with the care you need, when you need it.  Your next appointment:   6 month(s)  Provider:   Wilbert Bihari, MD

## 2024-03-30 NOTE — Telephone Encounter (Signed)
 Spoke with patient to arranged Watchman consult from Kenzie Campbell, NP. Arranged OV with Dr. Almetta 04/14/24 at 11:15 AM.

## 2024-03-30 NOTE — Assessment & Plan Note (Signed)
 11/2023 carotid duplex: Bilateral 1-39% ICA stenosis, stable since 2017 Left-sided carotid bruit auscultated on exam today Continue with annual follow-up as directed by primary cardiologist

## 2024-03-30 NOTE — Assessment & Plan Note (Signed)
 LDL November 2024: 32 Repeat fasting lipids, will order labs to be performed at some point over the next week giving patient is not fasting this morning Continue atorvastatin  20 mg daily

## 2024-04-02 ENCOUNTER — Ambulatory Visit: Payer: Self-pay | Admitting: Emergency Medicine

## 2024-04-02 LAB — CBC
Hematocrit: 45.2 % (ref 37.5–51.0)
Hemoglobin: 15.8 g/dL (ref 13.0–17.7)
MCH: 33.4 pg — ABNORMAL HIGH (ref 26.6–33.0)
MCHC: 35 g/dL (ref 31.5–35.7)
MCV: 96 fL (ref 79–97)
Platelets: 204 x10E3/uL (ref 150–450)
RBC: 4.73 x10E6/uL (ref 4.14–5.80)
RDW: 12.7 % (ref 11.6–15.4)
WBC: 10.1 x10E3/uL (ref 3.4–10.8)

## 2024-04-02 LAB — BASIC METABOLIC PANEL WITH GFR
BUN/Creatinine Ratio: 16 (ref 10–24)
BUN: 16 mg/dL (ref 8–27)
CO2: 23 mmol/L (ref 20–29)
Calcium: 9.3 mg/dL (ref 8.6–10.2)
Chloride: 101 mmol/L (ref 96–106)
Creatinine, Ser: 1.02 mg/dL (ref 0.76–1.27)
Glucose: 100 mg/dL — ABNORMAL HIGH (ref 70–99)
Potassium: 4.4 mmol/L (ref 3.5–5.2)
Sodium: 137 mmol/L (ref 134–144)
eGFR: 76 mL/min/1.73

## 2024-04-02 LAB — LIPID PANEL
Chol/HDL Ratio: 2 ratio (ref 0.0–5.0)
Cholesterol, Total: 92 mg/dL — ABNORMAL LOW (ref 100–199)
HDL: 45 mg/dL
LDL Chol Calc (NIH): 32 mg/dL (ref 0–99)
Triglycerides: 72 mg/dL (ref 0–149)
VLDL Cholesterol Cal: 15 mg/dL (ref 5–40)

## 2024-04-14 ENCOUNTER — Ambulatory Visit
Attending: Student in an Organized Health Care Education/Training Program | Admitting: Student in an Organized Health Care Education/Training Program

## 2024-04-14 ENCOUNTER — Encounter: Payer: Self-pay | Admitting: Student in an Organized Health Care Education/Training Program

## 2024-04-14 DIAGNOSIS — K922 Gastrointestinal hemorrhage, unspecified: Secondary | ICD-10-CM | POA: Insufficient documentation

## 2024-04-14 DIAGNOSIS — I480 Paroxysmal atrial fibrillation: Secondary | ICD-10-CM | POA: Diagnosis not present

## 2024-04-14 NOTE — Patient Instructions (Signed)
 Medication Instructions:  Your physician recommends that you continue on your current medications as directed. Please refer to the Current Medication list given to you today.  *If you need a refill on your cardiac medications before your next appointment, please call your pharmacy*  Lab Work: None ordered.  If you have labs (blood work) drawn today and your tests are completely normal, you will receive your results only by: MyChart Message (if you have MyChart) OR A paper copy in the mail If you have any lab test that is abnormal or we need to change your treatment, we will call you to review the results.  Testing/Procedures: None ordered.   Follow-Up: At [facility], you and your health needs are our priority.  As part of our continuing mission to provide you with exceptional heart care, our providers are all part of one team.  This team includes your primary Cardiologist (physician) and Advanced Practice Providers or APPs (Physician Assistants and Nurse Practitioners) who all work together to provide you with the care you need, when you need it.  Your next appointment:   Follow up with Dr Almetta as needed

## 2024-04-14 NOTE — Progress Notes (Unsigned)
 " Cardiology Office Note   Date: 04/14/24 ID:  Julian Weaver, DOB Aug 22, 1945, MRN [id number] PCP: Julian Lamarr RAMAN, MD  [facility] Providers Cardiologist:  Wilbert Bihari, MD Electrophysiologist:  Julian DELENA Primus, MD   History of Present Illness Julian Weaver is a 79 y.o. male with paroxysmal AF, CAD s/p RCA PCI (2010), anemia, HTN, bilateral carotid artery stenosis, and HLD who was referred for AF management and LAAO consideration for stroke risk reduction.  Discussed the use of AI scribe software for clinical note transcription with the patient, who gave verbal consent to proceed. History of Present Illness Julian Weaver is a 79 year old male with atrial fibrillation who presents for evaluation of treatment options to discontinue blood thinners.  He has a history of atrial fibrillation and is currently managed with Multaq  (dronedarone ) and Eliquis  (apixaban ). He has been on blood thinners for several years and is interested in discontinuing them due to concerns about recurrent bleeding.  In January of the previous year, he experienced a significant gastrointestinal bleed that required hospitalization for six days. Despite extensive testing, including a colonoscopy and capsule endoscopy, the source of the bleed was not identified. He received multiple units of blood and iron infusions during this episode. No recurrent bleeding has occurred this year while on Eliquis .  He can feel when he goes into atrial fibrillation, describing it as a 'fluttering' sensation that feels like he has 'run a hundred yard dash'. These episodes can last from an hour to half a day and are disruptive to his daily activities.  He is interested in the Watchman device as a means to discontinue blood thinners. His brother, who lives in [place], has undergone ablation twice and is in the process of being weaned off heart medications, which influences his interest in pursuing similar  treatment options.  He works in holiday representative as a systems analyst, psychiatrist, chemical engineer. No ongoing bleeding this year while on Eliquis .  ROS: none   Studies Reviewed  ECG review 03/30/24: SB 54, PR 146, QRS 136, QT/c 468/443, RBBB 02/11/24: (11:32:50) SB 55, PR 138, QRS 138, QT/c 462/441, RBBB 02/11/24: (11:27:52) SB 51, PR 138, QRS 138, QT/c 468/431, RBBB 05/06/23: SB 57, PR 134, QRS 138, QT/c 434/422, RBBB 04/29/23: NSR 61, PR 140, QRS 134, QT/c 448/450, RBBB  TTE Result date: 03/21/23  1. Left ventricular ejection fraction, by estimation, is 60 to 65%. The  left ventricle has normal function. The left ventricle has no regional  wall motion abnormalities. Left ventricular diastolic parameters are  indeterminate.   2. Right ventricular systolic function is normal. The right ventricular  size is normal.   3. The mitral valve is normal in structure. Trivial mitral valve  regurgitation. No evidence of mitral stenosis.   4. The aortic valve is tricuspid. Aortic valve regurgitation is trivial.  No aortic stenosis is present.   5. The inferior vena cava is normal in size with greater than 50%  respiratory variability, suggesting right atrial pressure of 3 mmHg.   Physical Exam VS:  BP 126/70 (BP Location: Left Arm, Patient Position: Sitting, Cuff Size: Normal)   Pulse (!) 54   Ht 5' 9 (1.753 m)   Wt 175 lb (79.4 kg)   SpO2 95%   BMI 25.84 kg/m   Wt Readings from Last 3 Encounters:  04/14/24 175 lb (79.4 kg)  03/30/24 178 lb 9.6 oz (81 kg)  02/11/24 174 lb 3.2 oz (79 kg)    GEN: Well  nourished, well developed in no acute distress NECK: No JVD; No carotid bruits CARDIAC: RRR, no murmurs, rubs, gallops RESPIRATORY: Clear to auscultation without rales, wheezing or rhonchi  ABDOMEN: Soft, non-tender, non-distended EXTREMITIES: No edema; No deformity   ASSESSMENT AND PLAN [name] is a 79 y.o. male with paroxysmal AF, CAD s/p RCA PCI (2010), anemia, HTN,  bilateral carotid artery stenosis, and HLD who was referred for AF management and LAAO consideration for stroke risk reduction. Assessment & Plan Paroxysmal atrial fibrillation Managed with Multaq . Interested in reducing medication burden. Discussed Watchman device and ablation to potentially discontinue Multaq  and anticoagulation. Aware of procedural risks. Leaning towards both procedures. - Schedule Watchman device placement and ablation. - Continue anticoagulation for three months post-ablation. - Reassess need for Multaq  post-procedure.  Long-term anticoagulation with history of gastrointestinal bleeding Long-term Eliquis  use complicated by past gastrointestinal bleed. Interested in discontinuing anticoagulation. Discussed Watchman device as a solution. Aware of procedural risks. - Proceed with Watchman device placement to allow discontinuation of anticoagulation within three to six months.  Dispo: RTC prn   Signed, Julian DELENA Primus, MD  "

## 2024-04-20 ENCOUNTER — Telehealth: Payer: Self-pay

## 2024-04-20 NOTE — Telephone Encounter (Signed)
 Called patient to follow up on his appt with Dr. Almetta last week discussing concomitant ablation/watchman. Patient is still considering. Patient will reach out if he chooses to proceed. He was grateful for the call to check in.

## 2024-04-29 ENCOUNTER — Encounter: Payer: Self-pay | Admitting: Physician Assistant

## 2024-04-29 ENCOUNTER — Ambulatory Visit: Admitting: Physician Assistant

## 2024-04-29 DIAGNOSIS — M1711 Unilateral primary osteoarthritis, right knee: Secondary | ICD-10-CM | POA: Insufficient documentation

## 2024-04-29 MED ORDER — LIDOCAINE HCL 1 % IJ SOLN
3.0000 mL | INTRAMUSCULAR | Status: AC | PRN
  Administered 2024-04-29: 3 mL

## 2024-04-29 MED ORDER — METHYLPREDNISOLONE ACETATE 40 MG/ML IJ SUSP
40.0000 mg | INTRAMUSCULAR | Status: AC | PRN
  Administered 2024-04-29: 40 mg via INTRA_ARTICULAR

## 2024-04-29 NOTE — Progress Notes (Signed)
 "  Office Visit Note   Patient: Julian Weaver           Date of Birth: 1946-03-06           MRN: [id number] Visit Date: 04/29/2024              Requested by: Chrystal Lamarr RAMAN, MD [phone] [facility] [address] [place],  [place] PCP: Chrystal Lamarr RAMAN, MD  No chief complaint on file.     HPI: Patient is a pleasant 79 year old gentleman who has a history of arthritis of his right knee and also CPPD.  He comes in periodically for aspiration and injection.  Asking for an injection today  Assessment & Plan: Visit Diagnoses:  1. Unilateral primary osteoarthritis, right knee     Plan: Did not have an effusion today I did go forward with a steroid injection I did notice that he had quite a bit dryness in his knees and he does do quite a bit of his work on his hands and his knees I told him he needs to be sure to put lotion on his knees and wear kneepads prevent any bursitis  Follow-Up Instructions: No follow-ups on file.   Ortho Exam  Patient is alert, oriented, no adenopathy, well-dressed, normal affect, normal respiratory effort. Right knee no effusion no erythema compartments are soft and compressible is neurovascular intact he does have some dryness but no skin breakdown no evidence of any infection    Imaging: No results found. No images are attached to the encounter.  Labs: Lab Results  Component Value Date   ESRSEDRATE 6 07/15/2017   CRP 2.7 07/15/2017   LABURIC 4.2 07/15/2017     Lab Results  Component Value Date   ALBUMIN 4.0 05/06/2023   ALBUMIN 2.7 (L) 03/26/2023   ALBUMIN 2.9 (L) 03/25/2023    Lab Results  Component Value Date   MG 2.2 03/26/2023   MG 1.8 03/25/2023   No results found for: VD25OH  No results found for: PREALBUMIN    Latest Ref Rng & Units 04/01/2024    9:43 AM 02/10/2024    8:47 AM 10/17/2023   10:02 AM  CBC EXTENDED  WBC 3.4 - 10.8 x10E3/uL 10.1  9.7  9.7   RBC 4.14 - 5.80 x10E6/uL 4.73  4.82  4.65   Hemoglobin 13.0 - 17.7  g/dL 84.1  84.2  84.7   HCT 37.5 - 51.0 % 45.2  45.4  43.3   Platelets 150 - 450 x10E3/uL 204  192  187   NEUT# 1.7 - 7.7 K/uL  7.3  7.1   Lymph# 0.7 - 4.0 K/uL  1.2  1.4      There is no height or weight on file to calculate BMI.  Orders:  Orders Placed This Encounter  Procedures   Large Joint Inj   No orders of the defined types were placed in this encounter.    Procedures: Large Joint Inj: R knee on 04/29/2024 8:48 AM Indications: pain and diagnostic evaluation Details: 25 G 1.5 in needle, anteromedial approach  Arthrogram: No  Medications: 40 mg methylPREDNISolone  acetate 40 MG/ML; 3 mL lidocaine  1 % Outcome: tolerated well, no immediate complications Procedure, treatment alternatives, risks and benefits explained, specific risks discussed. Consent was given by the patient.      Clinical Data: No additional findings.  ROS:  All other systems negative, except as noted in the HPI. Review of Systems  Objective: Vital Signs: There were no vitals taken for this  visit.  Specialty Comments:  No specialty comments available.  PMFS History: Patient Active Problem List   Diagnosis Date Noted   Unilateral primary osteoarthritis, right knee 04/29/2024   Hamstring strain, left, initial encounter 01/27/2024   Anemia 07/08/2023   Iron deficiency anemia 05/06/2023   Colon, diverticulosis 03/28/2023   Internal hemorrhoids 03/28/2023   Syncope 03/26/2023   Postural dizziness 03/26/2023   Hyperlipidemia 03/26/2023   History of COPD 03/26/2023   Acute blood loss anemia 03/26/2023   Symptomatic anemia 03/25/2023   Paroxysmal atrial fibrillation (HCC) 11/06/2022   Hypercoagulable state due to paroxysmal atrial fibrillation (HCC) 11/06/2022   Spondylosis without myelopathy or radiculopathy, cervical region 06/25/2022   Aortic atherosclerosis 11/22/2021   Pain in left knee 05/15/2016   Swelling of left knee joint 05/15/2016   Carotid artery bruit 04/01/2016   Carotid  artery stenosis    Cigarette smoker 10/27/2014   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   Left inguinal hernia 11/12/2013   Tobacco abuse 03/12/2013   Coronary atherosclerosis of native coronary artery 03/11/2013   Pure hypercholesterolemia 03/11/2013   Essential hypertension 03/11/2013   Encounter for long-term (current) use of other medications 03/11/2013   Bruit 03/11/2013   Past Medical History:  Diagnosis Date   Allergic rhinitis    Aortic atherosclerosis    Arthritis    Carotid artery stenosis    1-39% bilateral dopplers 11/2023   Coronary artery disease 04/13/2008   Drug eluting stent in RCA // Myoview  04/2019: EF 52, normal perfusion; low risk   Hypercholesteremia    Hypertension    PAF (paroxysmal atrial fibrillation) (HCC)    RBBB    Renal disorder    Tobacco abuse     Family History  Problem Relation Age of Onset   Stroke Mother    Stroke Father     Past Surgical History:  Procedure Laterality Date   CAROTID STENT  2010   COLONOSCOPY N/A 03/28/2023   Procedure: COLONOSCOPY;  Surgeon: Kriss Estefana DEL, DO;  Location: [facility] ENDOSCOPY;  Service: Gastroenterology;  Laterality: N/A;   ESOPHAGOGASTRODUODENOSCOPY N/A 03/28/2023   Procedure: ESOPHAGOGASTRODUODENOSCOPY (EGD);  Surgeon: Kriss Estefana DEL, DO;  Location: [facility] ENDOSCOPY;  Service: Gastroenterology;  Laterality: N/A;   SHOULDER ACROMIOPLASTY  2010   Social History   Occupational History   Not on file  Tobacco Use   Smoking status: Every Day    Current packs/day: 1.00    Average packs/day: 1 pack/day for 57.5 years (57.5 ttl pk-yrs)    Types: Cigarettes    Start date: 10/27/1982   Smokeless tobacco: Never   Tobacco comments:    Half-pack daily 11/09/2020  Substance and Sexual Activity   Alcohol use: Yes    Alcohol/week: 1.0 standard drink of alcohol    Types: 1 Cans of beer per week    Comment: Rarely- 3 times a month   Drug use: No   Sexual activity: Not on file       "

## 2024-08-09 ENCOUNTER — Inpatient Hospital Stay

## 2024-08-09 ENCOUNTER — Inpatient Hospital Stay: Admitting: Internal Medicine

## 2024-08-11 ENCOUNTER — Ambulatory Visit (HOSPITAL_COMMUNITY): Admitting: Physician Assistant
